# Patient Record
Sex: Female | Born: 1990 | Race: Black or African American | Hispanic: No | Marital: Single | Smoking: Current every day smoker
Health system: Southern US, Community
[De-identification: ages and names within clinical notes are randomized; demographics above are authoritative.]

## PROBLEM LIST (undated history)

## (undated) ENCOUNTER — Ambulatory Visit: Admission: EM | Disposition: A | Discharge status: Home / Self Care | Payer: 59

## (undated) DIAGNOSIS — T7840XA Allergy, unspecified, initial encounter: Secondary | ICD-10-CM

## (undated) DIAGNOSIS — R768 Other specified abnormal immunological findings in serum: Secondary | ICD-10-CM

## (undated) DIAGNOSIS — F32A Depression, unspecified: Secondary | ICD-10-CM

## (undated) DIAGNOSIS — N760 Acute vaginitis: Secondary | ICD-10-CM

## (undated) DIAGNOSIS — F319 Bipolar disorder, unspecified: Secondary | ICD-10-CM

## (undated) DIAGNOSIS — J45909 Unspecified asthma, uncomplicated: Secondary | ICD-10-CM

## (undated) DIAGNOSIS — F419 Anxiety disorder, unspecified: Secondary | ICD-10-CM

## (undated) DIAGNOSIS — B9689 Other specified bacterial agents as the cause of diseases classified elsewhere: Secondary | ICD-10-CM

## (undated) DIAGNOSIS — R7689 Other specified abnormal immunological findings in serum: Secondary | ICD-10-CM

## (undated) DIAGNOSIS — F329 Major depressive disorder, single episode, unspecified: Secondary | ICD-10-CM

## (undated) HISTORY — DX: Allergy, unspecified, initial encounter: T78.40XA

## (undated) HISTORY — DX: Unspecified asthma, uncomplicated: J45.909

## (undated) HISTORY — DX: Other specified abnormal immunological findings in serum: R76.8

## (undated) HISTORY — DX: Other specified abnormal immunological findings in serum: R76.89

## (undated) HISTORY — DX: Depression, unspecified: F32.A

## (undated) HISTORY — DX: Other specified bacterial agents as the cause of diseases classified elsewhere: B96.89

## (undated) HISTORY — DX: Anxiety disorder, unspecified: F41.9

## (undated) HISTORY — DX: Other specified bacterial agents as the cause of diseases classified elsewhere: N76.0

## (undated) HISTORY — DX: Bipolar disorder, unspecified: F31.9

## (undated) HISTORY — PX: WISDOM TOOTH EXTRACTION: SHX21

---
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
## 2015-03-21 DIAGNOSIS — J309 Allergic rhinitis, unspecified: Secondary | ICD-10-CM | POA: Insufficient documentation

## 2015-03-21 DIAGNOSIS — J4520 Mild intermittent asthma, uncomplicated: Secondary | ICD-10-CM | POA: Insufficient documentation

## 2015-04-03 DIAGNOSIS — B977 Papillomavirus as the cause of diseases classified elsewhere: Secondary | ICD-10-CM | POA: Insufficient documentation

## 2016-05-01 ENCOUNTER — Other Ambulatory Visit
Admit: 2016-05-01 | Discharge: 2016-05-01 | Disposition: A | Discharge status: Home / Self Care | Attending: Family Medicine | Admitting: Family Medicine

## 2016-05-01 NOTE — ED Notes (Signed)
Patient presents to ED in custody of [employer] officer [place]. Patient here for the purposes of a forensic blood draw secondary to the charge of her driving while impaired. Rights were read to patient by officer and patient provided verbal acknowledgement of those rights. Patient also providing verbal consent for this RN to obtain police requested blood specimens. Specimen kit (sealed) obtained from officer and opened by this RN in front of the patient. Materials assembled per protocol. Tourniquet applied to right upper arm; vein located. Venipuncture site cleansed with betadine and allowed to air dry. Venipuncture performed to RAC using provided materials. Two blood tubes collected from patient per police request; patient tolerated procedure well without any c/o of pain. Samples labeled, secured, and sealed in police provided bag/box in front of patient in order to maintain chain of custody. Kit provided to arresting officer (G[place]oux) by this RN. Patient departs from ED in custody of [employer] officer at this time. Patient denies the need to be seen and evaluated by ED provider at this time; no complaints reported. 

## 2016-07-08 ENCOUNTER — Ambulatory Visit
Admission: RE | Admit: 2016-07-08 | Discharge: 2016-07-08 | Disposition: A | Discharge status: Home / Self Care | Payer: Disability Insurance | Source: Ambulatory Visit | Attending: General Practice | Admitting: General Practice

## 2016-07-08 ENCOUNTER — Other Ambulatory Visit: Payer: Self-pay | Admitting: General Practice

## 2016-07-08 DIAGNOSIS — M19041 Primary osteoarthritis, right hand: Secondary | ICD-10-CM | POA: Insufficient documentation

## 2016-07-08 DIAGNOSIS — M19042 Primary osteoarthritis, left hand: Secondary | ICD-10-CM | POA: Insufficient documentation

## 2016-07-08 DIAGNOSIS — M1990 Unspecified osteoarthritis, unspecified site: Secondary | ICD-10-CM

## 2016-08-04 NOTE — Addendum Note (Signed)
Encounter addended by: Vilinda BoehringerHeather P Treshun Wold, RT on: 08/04/2016  9:09 AM<BR>    Actions taken: Imaging Exam begun

## 2016-08-04 NOTE — Addendum Note (Signed)
Encounter addended by: Vilinda BoehringerHeather P Consepcion Utt, RT on: 08/04/2016  9:08 AM<BR>    Actions taken: Imaging Exam begun

## 2016-11-30 ENCOUNTER — Telehealth (HOSPITAL_COMMUNITY): Payer: Self-pay | Admitting: Professional

## 2017-05-30 ENCOUNTER — Ambulatory Visit: Payer: Self-pay

## 2017-06-06 ENCOUNTER — Encounter (INDEPENDENT_AMBULATORY_CARE_PROVIDER_SITE_OTHER): Payer: Self-pay

## 2017-06-06 ENCOUNTER — Ambulatory Visit: Payer: Self-pay | Admitting: Pharmacy Technician

## 2017-06-06 DIAGNOSIS — Z79899 Other long term (current) drug therapy: Secondary | ICD-10-CM

## 2017-06-06 NOTE — Progress Notes (Signed)
Met with patient completed [facility] application.  Patient indicated that she was enrolled in a healthcare exchange through the [place].  She also indicated that she had applied for Medicaid and SS Disability.  Told patient that we needed a letter indicating her insurance with the [place] had been terminated and a denial letter from [facility] and SS Disability.  I also told patient that I needed a copy of her 2017 tax return.  Offered to allow patient the use of [facility]'s computer to print-off a copy of her tax return.   Patient stated that she was "tired of all the damn stuff that she had to produce to get government assistance".  Explained to patient that we provide medication assistance to uninsured individuals within [facility] whose income is at or below 250% of the [place].  Patients have to provide documentation that they are uninsured and meet the income criteria.  Patient stated that she did not want to finish eligibility appointment.  She wanted to leave.  Encouraged patient to remain and finish appointment and I would provide her with a checklist of additional information that I needed to determine her eligibility for our program.  Patient asked why I couldn't tell her if she was eligible right now.  I explained that I needed the additional requested documentation in order to make a final determination.    Patient got up abruptly and stormed out.  While she was leaving she stated that "she had to leave because she was about to cuss somebody out".    [facility]

## 2018-06-28 ENCOUNTER — Other Ambulatory Visit: Payer: Self-pay | Admitting: Ophthalmology

## 2018-06-28 DIAGNOSIS — H4710 Unspecified papilledema: Secondary | ICD-10-CM

## 2018-07-06 ENCOUNTER — Ambulatory Visit
Admission: RE | Admit: 2018-07-06 | Discharge: 2018-07-06 | Disposition: A | Discharge status: Home / Self Care | Payer: Medicaid Other | Source: Ambulatory Visit | Attending: Ophthalmology | Admitting: Ophthalmology

## 2018-07-06 DIAGNOSIS — H4710 Unspecified papilledema: Secondary | ICD-10-CM | POA: Diagnosis present

## 2018-07-06 MED ORDER — GADOBUTROL 1 MMOL/ML IV SOLN
7.0000 mL | Freq: Once | INTRAVENOUS | Status: AC | PRN
  Administered 2018-07-06: 7 mL via INTRAVENOUS

## 2018-10-02 ENCOUNTER — Other Ambulatory Visit: Payer: Self-pay

## 2018-10-02 ENCOUNTER — Encounter: Payer: Self-pay | Admitting: Emergency Medicine

## 2018-10-02 ENCOUNTER — Emergency Department
Admission: EM | Admit: 2018-10-02 | Discharge: 2018-10-02 | Discharge status: Against Medical Advice | Payer: Medicare Other | Attending: Emergency Medicine | Admitting: Emergency Medicine

## 2018-10-02 DIAGNOSIS — Y929 Unspecified place or not applicable: Secondary | ICD-10-CM | POA: Diagnosis not present

## 2018-10-02 DIAGNOSIS — Z23 Encounter for immunization: Secondary | ICD-10-CM | POA: Insufficient documentation

## 2018-10-02 DIAGNOSIS — W540XXA Bitten by dog, initial encounter: Secondary | ICD-10-CM | POA: Insufficient documentation

## 2018-10-02 DIAGNOSIS — Y999 Unspecified external cause status: Secondary | ICD-10-CM | POA: Insufficient documentation

## 2018-10-02 DIAGNOSIS — Z2914 Encounter for prophylactic rabies immune globin: Secondary | ICD-10-CM | POA: Diagnosis not present

## 2018-10-02 DIAGNOSIS — S71132A Puncture wound without foreign body, left thigh, initial encounter: Secondary | ICD-10-CM | POA: Insufficient documentation

## 2018-10-02 DIAGNOSIS — Y939 Activity, unspecified: Secondary | ICD-10-CM | POA: Diagnosis not present

## 2018-10-02 DIAGNOSIS — S79922A Unspecified injury of left thigh, initial encounter: Secondary | ICD-10-CM | POA: Diagnosis present

## 2018-10-02 DIAGNOSIS — F17210 Nicotine dependence, cigarettes, uncomplicated: Secondary | ICD-10-CM | POA: Insufficient documentation

## 2018-10-02 MED ORDER — RABIES IMMUNE GLOBULIN 150 UNIT/ML IM INJ
20.0000 [IU]/kg | INJECTION | Freq: Once | INTRAMUSCULAR | Status: DC
  Filled 2018-10-02: qty 12

## 2018-10-02 MED ORDER — RABIES VACCINE, PCEC IM SUSR: 1.0000 mL | Freq: Once | INTRAMUSCULAR | Status: DC

## 2018-10-02 NOTE — ED Triage Notes (Signed)
Dog bite last Wed on left upper thigh, unsure when dog had last rabies shots, Dr informed her to come here. NAD.

## 2018-10-02 NOTE — ED Notes (Signed)
This RN told by PA patient walked out and left

## 2018-10-02 NOTE — Discharge Instructions (Signed)
Please begin Augmentin.  Please return to the emergency department on April 6, April 20, April 27 for continued rabies vaccinations.

## 2018-10-02 NOTE — ED Notes (Signed)
See triage note  Presents s/p dog bite  Stats she was bitten by friends dog last weds .  States her friend told her that the dog was up to date. But when asked to have prove of rabies  She was not able to get that info

## 2018-10-02 NOTE — ED Provider Notes (Signed)
[facility] Emergency Department Provider Note  ____________________________________________  Time seen: Approximately 3:04 PM  I have reviewed the triage vital signs and the nursing notes.   HISTORY  Chief Complaint Animal Bite    HPI Ellen Kaiser is a 28 y.o. female presents emergency department for evaluation of dog bite to right thigh that happened on 4/8. Patient states that her friend's dog bit her when she was trying to enter their house.  Patient asked friend whether dog was up-to-date on rabies, dog responded yes but then blocked her when she requested a copy of the dog's rabies paperwork.  Patient went to primary care this morning who recommended that patient come to the emergency department for rabies vaccinations.  She was given a prescription for Augmentin at primary care.  Last tetanus shot was less than 5 years ago.   History reviewed. No pertinent past medical history.  There are no active problems to display for this patient.   History reviewed. No pertinent surgical history.  Prior to Admission medications   Not on File    Allergies Patient has no known allergies.  No family history on file.  Social History Social History   Tobacco Use  . Smoking status: Current Every Day Smoker    Packs/day: 0.50    Types: Cigarettes  . Smokeless tobacco: Never Used  Substance Use Topics  . Alcohol use: Not Currently  . Drug use: Not on file     Review of Systems  Constitutional: No fever/chills Respiratory: No SOB. Gastrointestinal:  No nausea, no vomiting.  Musculoskeletal: Negative for musculoskeletal pain. Skin: Negative for rash, ecchymosis.  Positive for puncture. Neurological: Negative for headaches, numbness or tingling   ____________________________________________   PHYSICAL EXAM:  VITAL SIGNS: ED Triage Vitals  Enc Vitals Group     BP 10/02/18 1410 121/81     Pulse Rate 10/02/18 1410 71     Resp 10/02/18  1410 18     Temp 10/02/18 1410 99 F (37.2 C)     Temp Source 10/02/18 1410 Oral     SpO2 10/02/18 1410 97 %     Weight 10/02/18 1403 174 lb (78.9 kg)     Height 10/02/18 1403 5' (1.524 m)     Head Circumference --      Peak Flow --      Pain Score 10/02/18 1403 0     Pain Loc --      Pain Edu? --      Excl. in GC? --      Constitutional: Alert and oriented. Well appearing and in no acute distress. Eyes: Conjunctivae are normal. PERRL. EOMI. Head: Atraumatic. ENT:      Ears:      Nose: No congestion/rhinnorhea.      Mouth/Throat: Mucous membranes are moist.  Neck: No stridor.  Cardiovascular: Normal rate, regular rhythm.  Good peripheral circulation. Respiratory: Normal respiratory effort without tachypnea or retractions. Lungs CTAB. Good air entry to the bases with no decreased or absent breath sounds. Musculoskeletal: Full range of motion to all extremities. No gross deformities appreciated.  Neurologic:  Normal speech and language. No gross focal neurologic deficits are appreciated.  Skin:  Skin is warm, dry.  Puncture to right thigh with yellow purulent drainage.  No surrounding erythema. Psychiatric: Mood and affect are normal. Speech and behavior are normal. Patient exhibits appropriate insight and judgement.   ____________________________________________   LABS (all labs ordered are listed, but only abnormal results are displayed)  Labs Reviewed - No data to display ____________________________________________  EKG   ____________________________________________  RADIOLOGY   No results found.  ____________________________________________    PROCEDURES  Procedure(s) performed:    Procedures    Medications  rabies vaccine (RABAVERT) injection 1 mL (has no administration in time range)  rabies immune globulin (HYPERAB/KEDRAB) injection 1,575 Units (has no administration in time range)     ____________________________________________   INITIAL  IMPRESSION / ASSESSMENT AND PLAN / ED COURSE  Pertinent labs & imaging results that were available during my care of the patient were reviewed by me and considered in my medical decision making (see chart for details).  Review of the [place] CSRS was performed in accordance of the [facility] prior to dispensing any controlled drugs.   Patient presents emergency department for evaluation of dog bite.  Patient was given a prescription for Augmentin by primary care.  Her tetanus shot is up-to-date.  Dog that bit patient is the dog of a friend that lives in [place].  Friend states that the dog is up-to-date with rabies but patient has not been able to get ahold of friend in [place] since she had requested rabies vaccination proof.  Police were consulted in the emergency department, who was able to get ahold of officials for [place] where patient's friend lives.  Rabies vaccination was not initially ordered upon patient's immediate arrival to the emergency department with hopes of being able to quarantine the dog through [facility].  I discussed with patient that we had the option of beginning rabies vaccines versus waiting a couple of days to see if animal control is able to get a hold of friends dog for quarantine.  Patient understands that rabies is deadly once symptoms develop without vaccination.  After patient talked with police in the emergency department and officials in [facility], patient elects to begin rabies vaccinations in the emergency department.  Patient eloped while waiting for rabies vaccination and immunoglobulin from pharmacy.      ____________________________________________  FINAL CLINICAL IMPRESSION(S) / ED DIAGNOSES  Final diagnoses:  Need for rabies vaccination  Dog bite, initial encounter      NEW MEDICATIONS STARTED DURING THIS VISIT:  ED Discharge Orders    None          This chart was dictated using voice recognition software/Dragon. Despite best efforts to  proofread, errors can occur which can change the meaning. Any change was purely unintentional.    Enid Derry, PA-C 10/02/18 Samuel Bouche, [place], MD 10/04/18 [phone]

## 2018-10-02 NOTE — ED Notes (Addendum)
This RN witnessed patient walking out of the waiting room.  This RN asked patient if I could assist her.  Patient reports frustration regarding having to wait for her rabies shots.  Patient states, "I'm about to go the f off because I could give a rabies shot to my self and it is really stressing me out to be here."  This RN explained that the first set of rabies shots takes some time for the pharmacy to get in order and this is likely the delay.  Patient states, "I just have to go and try to go somewhere else."

## 2019-04-02 ENCOUNTER — Ambulatory Visit (INDEPENDENT_AMBULATORY_CARE_PROVIDER_SITE_OTHER): Payer: Medicare Other | Admitting: Internal Medicine

## 2019-04-02 ENCOUNTER — Encounter: Payer: Self-pay | Admitting: Internal Medicine

## 2019-04-02 ENCOUNTER — Other Ambulatory Visit: Payer: Self-pay

## 2019-04-02 VITALS — BP 120/88 | HR 70 | Ht 61.0 in | Wt 164.0 lb

## 2019-04-02 DIAGNOSIS — Z202 Contact with and (suspected) exposure to infections with a predominantly sexual mode of transmission: Secondary | ICD-10-CM | POA: Diagnosis not present

## 2019-04-02 DIAGNOSIS — F17200 Nicotine dependence, unspecified, uncomplicated: Secondary | ICD-10-CM

## 2019-04-02 DIAGNOSIS — G43009 Migraine without aura, not intractable, without status migrainosus: Secondary | ICD-10-CM

## 2019-04-02 DIAGNOSIS — J4520 Mild intermittent asthma, uncomplicated: Secondary | ICD-10-CM | POA: Diagnosis not present

## 2019-04-02 DIAGNOSIS — F319 Bipolar disorder, unspecified: Secondary | ICD-10-CM | POA: Insufficient documentation

## 2019-04-02 MED ORDER — CHANTIX STARTING MONTH PAK 0.5 MG X 11 & 1 MG X 42 PO TABS: ORAL_TABLET | ORAL | 0 refills | Status: DC

## 2019-04-02 MED ORDER — VARENICLINE TARTRATE 1 MG PO TABS: 1.0000 mg | ORAL_TABLET | Freq: Two times a day (BID) | ORAL | 1 refills | Status: AC

## 2019-04-02 NOTE — Progress Notes (Signed)
Date:  04/02/2019   Name:  Ellen Kaiser   DOB:  07-06-1990   MRN:  [id number]   Chief Complaint: Establish Care and STD Screening  Asthma She complains of wheezing. There is no cough or shortness of breath. This is a recurrent problem. The problem occurs intermittently (about twice a week). The problem has been unchanged. Associated symptoms include headaches. Pertinent negatives include no chest pain, fever or trouble swallowing. Her symptoms are aggravated by exposure to smoke. Her symptoms are not alleviated by beta-agonist. Risk factors for lung disease include smoking/tobacco exposure. Her past medical history is significant for asthma.  Depression        This is a chronic problem.  The onset quality is undetermined (followed by Psychiatry for anxiety, insomnia, PTSD and depression).   Associated symptoms include headaches.  Associated symptoms include no fatigue and no suicidal ideas. Migraine  This is a recurrent problem. The problem occurs monthly. The problem has been unchanged. The pain is located in the frontal region. The pain does not radiate. The pain is moderate. Associated symptoms include nausea and photophobia. Pertinent negatives include no coughing, dizziness, fever or weakness. Treatments tried: usually sleep therapy with clonazepam and a dark room; sometimes takes nsaids. The treatment provided significant relief.  STD testing - pt reports one partner currently but she is not sure that she trusts him to be monogamous.  She uses condoms most of the time but occasionally does not and sometimes they break.  She has an IUD for contraception.  She denies vaginal discharge, genital sores, fatigue, fever, rash.  Review of Systems  Constitutional: Negative for chills, fatigue and fever.  HENT: Negative for trouble swallowing.   Eyes: Positive for photophobia.  Respiratory: Positive for wheezing. Negative for cough, chest tightness and shortness of breath.    Cardiovascular: Negative for chest pain, palpitations and leg swelling.  Gastrointestinal: Positive for nausea. Negative for constipation.  Genitourinary: Negative for dysuria, genital sores and hematuria.  Skin: Positive for rash (currently being treated for folliculits).  Neurological: Positive for headaches. Negative for dizziness, weakness and light-headedness.  Psychiatric/Behavioral: Positive for depression, dysphoric mood and sleep disturbance. Negative for suicidal ideas. The patient is nervous/anxious.     Patient Active Problem List   Diagnosis Date Noted  . Bipolar 1 disorder (Ramirez-Perez) 04/02/2019  . Tobacco use disorder 04/02/2019  . 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 Mild intermittent asthma without complication 57/84/6962  . Allergic rhinitis 03/21/2015    Allergies  Allergen Reactions  . Fish Allergy Anaphylaxis and Swelling    History reviewed. No pertinent surgical history.  Social History   Tobacco Use  . Smoking status: Current Every Day Smoker    Packs/day: 0.50    Years: 10.00    Pack years: 5.00    Types: Cigarettes  . Smokeless tobacco: Never Used  Substance Use Topics  . Alcohol use: Not Currently  . Drug use: Never     Medication list has been reviewed and updated.  Current Meds  Medication Sig  . ARIPiprazole (ABILIFY) 10 MG tablet Take 10 mg by mouth daily.  . cephALEXin (KEFLEX) 250 MG capsule Take 250 mg by mouth daily.  . clonazePAM (KLONOPIN) 1 MG tablet Take 1 tablet by mouth 2 (two) times daily.  . hydrOXYzine (ATARAX/VISTARIL) 50 MG tablet Take 1 tablet by mouth 3 (three) times daily.    PHQ 2/9 Scores 04/02/2019  PHQ - 2 Score 4  PHQ- 9 Score 7  BP Readings from Last 3 Encounters:  04/02/19 120/88  10/02/18 121/81    Physical Exam Vitals signs and nursing note reviewed.  Constitutional:      General: She is not in acute distress.    Appearance: Normal appearance. She is well-developed.  HENT:     Head: Normocephalic and  atraumatic.  Neck:     Musculoskeletal: Normal range of motion and neck supple.     Vascular: No carotid bruit.  Cardiovascular:     Rate and Rhythm: Normal rate and regular rhythm.     Pulses: Normal pulses.     Heart sounds: No murmur.  Pulmonary:     Effort: Pulmonary effort is normal. No respiratory distress.     Breath sounds: No wheezing or rhonchi.  Musculoskeletal: Normal range of motion.     Right lower leg: No edema.     Left lower leg: No edema.  Lymphadenopathy:     Cervical: No cervical adenopathy.  Skin:    General: Skin is warm and dry.     Capillary Refill: Capillary refill takes less than 2 seconds.     Findings: No rash.  Neurological:     General: No focal deficit present.     Mental Status: She is alert and oriented to person, place, and time.  Psychiatric:        Attention and Perception: Attention normal.        Mood and Affect: Mood normal.        Behavior: Behavior normal.        Thought Content: Thought content normal.        Cognition and Memory: Cognition normal.     Wt Readings from Last 3 Encounters:  04/02/19 164 lb (74.4 kg)  10/02/18 174 lb (78.9 kg)    BP 120/88   Pulse 70   Ht 5\' 1"  (1.549 m)   Wt 164 lb (74.4 kg)   SpO2 98%   BMI 30.99 kg/m   Assessment and Plan: 1. Mild intermittent asthma without complication Currently stable with mild intermittent sx Continue albuterol inhaler as needed If sx become more frequent, may need step up therapy  2. Tobacco use disorder Pt is very interested in quitting - will try Chantix Pt is given warning regarding changes in mood on this medication - varenicline (CHANTIX STARTING MONTH PAK) 0.5 MG X 11 & 1 MG X 42 tablet; Take one 0.5 mg tablet by mouth once daily for 3 days, then increase to one 0.5 mg tablet twice daily for 4 days, then increase to one 1 mg tablet twice daily.  Dispense: 53 tablet; Refill: 0 - varenicline (CHANTIX CONTINUING MONTH PAK) 1 MG tablet; Take 1 tablet (1 mg total)  by mouth 2 (two) times daily.  Dispense: 180 tablet; Refill: 1  3. Bipolar 1 disorder (HCC) Followed by Psychiatry  4. Exposure to sexually transmitted disease (STD) - GC/Chlamydia probe amp ([place])not at [facility] - HIV Antibody (routine testing w rflx)  5. Migraine without aura and without status migrainosus, not intractable Continue current therapy Return if headaches become more severe/frequent/unresponsive to current treatment   Partially dictated using [facility]. Any errors are unintentional.  Animal nutritionist, MD [facility]  04/02/2019

## 2019-04-02 NOTE — Addendum Note (Signed)
Addended by: Clista Bernhardt on: 04/02/2019 03:51 PM   Modules accepted: Orders

## 2019-04-02 NOTE — Patient Instructions (Signed)
Varenicline oral tablets What is this medicine? VARENICLINE (var e NI kleen) is used to help people quit smoking. It is used with a patient support program recommended by your physician. This medicine may be used for other purposes; ask your health care provider or pharmacist if you have questions. COMMON BRAND NAME(S): Chantix What should I tell my health care provider before I take this medicine? They need to know if you have any of these conditions:  heart disease  if you often drink alcohol  kidney disease  mental illness  on hemodialysis  seizures  history of stroke  suicidal thoughts, plans, or attempt; a previous suicide attempt by you or a family member  an unusual or allergic reaction to varenicline, other medicines, foods, dyes, or preservatives  pregnant or trying to get pregnant  breast-feeding How should I use this medicine? Take this medicine by mouth after eating. Take with a full glass of water. Follow the directions on the prescription label. Take your doses at regular intervals. Do not take your medicine more often than directed. There are 3 ways you can use this medicine to help you quit smoking; talk to your health care professional to decide which plan is right for you: 1) you can choose a quit date and start this medicine 1 week before the quit date, or, 2) you can start taking this medicine before you choose a quit date, and then pick a quit date between day 8 and 35 days of treatment, or, 3) if you are not sure that you are able or willing to quit smoking right away, start taking this medicine and slowly decrease the amount you smoke as directed by your health care professional with the goal of being cigarette-free by week 12 of treatment. Stick to your plan; ask about support groups or other ways to help you remain cigarette-free. If you are motivated to quit smoking and did not succeed during a previous attempt with this medicine for reasons other than  side effects, or if you returned to smoking after this treatment, speak with your health care professional about whether another course of this medicine may be right for you. A special MedGuide will be given to you by the pharmacist with each prescription and refill. Be sure to read this information carefully each time. Talk to your pediatrician regarding the use of this medicine in children. This medicine is not approved for use in children. Overdosage: If you think you have taken too much of this medicine contact a poison control center or emergency room at once. NOTE: This medicine is only for you. Do not share this medicine with others. What if I miss a dose? If you miss a dose, take it as soon as you can. If it is almost time for your next dose, take only that dose. Do not take double or extra doses. What may interact with this medicine?  alcohol  insulin  other medicines used to help people quit smoking  theophylline  warfarin This list may not describe all possible interactions. Give your health care provider a list of all the medicines, herbs, non-prescription drugs, or dietary supplements you use. Also tell them if you smoke, drink alcohol, or use illegal drugs. Some items may interact with your medicine. What should I watch for while using this medicine? It is okay if you do not succeed at your attempt to quit and have a cigarette. You can still continue your quit attempt and keep using this medicine as directed.   Just throw away your cigarettes and get back to your quit plan. Talk to your health care provider before using other treatments to quit smoking. Using this medicine with other treatments to quit smoking may increase the risk for side effects compared to using a treatment alone. You may get drowsy or dizzy. Do not drive, use machinery, or do anything that needs mental alertness until you know how this medicine affects you. Do not stand or sit up quickly, especially if you are  an older patient. This reduces the risk of dizzy or fainting spells. Decrease the number of alcoholic beverages that you drink during treatment with this medicine until you know if this medicine affects your ability to tolerate alcohol. Some people have experienced increased drunkenness (intoxication), unusual or sometimes aggressive behavior, or no memory of things that have happened (amnesia) during treatment with this medicine. Sleepwalking can happen during treatment with this medicine, and can sometimes lead to behavior that is harmful to you, other people, or property. Stop taking this medicine and tell your doctor if you start sleepwalking or have other unusual sleep-related activity. After taking this medicine, you may get up out of bed and do an activity that you do not know you are doing. The next morning, you may have no memory of this. Activities include driving a car ("sleep-driving"), making and eating food, talking on the phone, sexual activity, and sleep-walking. Serious injuries have occurred. Stop the medicine and call your doctor right away if you find out you have done any of these activities. Do not take this medicine if you have used alcohol that evening. Do not take it if you have taken another medicine for sleep. The risk of doing these sleep-related activities is higher. Patients and their families should watch out for new or worsening depression or thoughts of suicide. Also watch out for sudden changes in feelings such as feeling anxious, agitated, panicky, irritable, hostile, aggressive, impulsive, severely restless, overly excited and hyperactive, or not being able to sleep. If this happens, call your health care professional. If you have diabetes and you quit smoking, the effects of insulin may be increased and you may need to reduce your insulin dose. Check with your doctor or health care professional about how you should adjust your insulin dose. What side effects may I notice  from receiving this medicine? Side effects that you should report to your doctor or health care professional as soon as possible:  allergic reactions like skin rash, itching or hives, swelling of the face, lips, tongue, or throat  acting aggressive, being angry or violent, or acting on dangerous impulses  breathing problems  changes in emotions or moods  chest pain or chest tightness  feeling faint or lightheaded, falls  hallucination, loss of contact with reality  mouth sores  redness, blistering, peeling or loosening of the skin, including inside the mouth  signs and symptoms of a stroke like changes in vision; confusion; trouble speaking or understanding; severe headaches; sudden numbness or weakness of the face, arm or leg; trouble walking; dizziness; loss of balance or coordination  seizures  sleepwalking  suicidal thoughts or other mood changes Side effects that usually do not require medical attention (report to your doctor or health care professional if they continue or are bothersome):  constipation  gas  headache  nausea, vomiting  strange dreams  trouble sleeping This list may not describe all possible side effects. Call your doctor for medical advice about side effects. You may report side   effects to FDA at 1-800-FDA-1088. Where should I keep my medicine? Keep out of the reach of children. Store at room temperature between 15 and 30 degrees C (59 and 86 degrees F). Throw away any unused medicine after the expiration date. NOTE: This sheet is a summary. It may not cover all possible information. If you have questions about this medicine, talk to your doctor, pharmacist, or health care provider.  2020 Elsevier/Gold Standard (2018-05-26 14:27:36)  

## 2019-04-03 LAB — HIV ANTIBODY (ROUTINE TESTING W REFLEX): HIV Screen 4th Generation wRfx: NONREACTIVE

## 2019-04-04 LAB — GC/CHLAMYDIA PROBE AMP
Chlamydia trachomatis, NAA: NEGATIVE
Neisseria Gonorrhoeae by PCR: NEGATIVE

## 2019-05-23 ENCOUNTER — Encounter: Payer: Self-pay | Admitting: Internal Medicine

## 2019-05-23 DIAGNOSIS — B009 Herpesviral infection, unspecified: Secondary | ICD-10-CM | POA: Insufficient documentation

## 2019-05-23 DIAGNOSIS — R7689 Other specified abnormal immunological findings in serum: Secondary | ICD-10-CM | POA: Insufficient documentation

## 2019-05-23 DIAGNOSIS — R768 Other specified abnormal immunological findings in serum: Secondary | ICD-10-CM | POA: Insufficient documentation

## 2019-05-24 ENCOUNTER — Other Ambulatory Visit: Payer: Self-pay

## 2019-05-24 ENCOUNTER — Ambulatory Visit (INDEPENDENT_AMBULATORY_CARE_PROVIDER_SITE_OTHER): Payer: Medicare Other | Admitting: Internal Medicine

## 2019-05-24 ENCOUNTER — Encounter: Payer: Self-pay | Admitting: Internal Medicine

## 2019-05-24 VITALS — BP 122/70 | HR 73 | Ht 61.0 in | Wt 161.0 lb

## 2019-05-24 DIAGNOSIS — N760 Acute vaginitis: Secondary | ICD-10-CM

## 2019-05-24 DIAGNOSIS — Z975 Presence of (intrauterine) contraceptive device: Secondary | ICD-10-CM

## 2019-05-24 DIAGNOSIS — B9689 Other specified bacterial agents as the cause of diseases classified elsewhere: Secondary | ICD-10-CM | POA: Diagnosis not present

## 2019-05-24 LAB — POCT WET PREP WITH KOH
KOH Prep POC: NEGATIVE
RBC Wet Prep HPF POC: 0
Trichomonas, UA: NEGATIVE
Yeast Wet Prep HPF POC: NEGATIVE

## 2019-05-24 MED ORDER — METRONIDAZOLE 500 MG PO TABS: 500.0000 mg | ORAL_TABLET | Freq: Two times a day (BID) | ORAL | 0 refills | Status: DC

## 2019-05-24 NOTE — Progress Notes (Signed)
Date:  05/24/2019   Name:  Ellen Kaiser   DOB:  03-25-1991   MRN:  [id number]   Chief Complaint: Vaginal Itching (Itching and foul odor. Has had BV in the past. Pains in Rt side. Discomfort- like a "tickle from the inside" )  Vaginal Itching The patient's primary symptoms include genital itching, a genital odor, pelvic pain and vaginal discharge. This is a recurrent problem. Pertinent negatives include no chills, dysuria, fever, headaches or hematuria.  She was previously using Boric acid suppositories for prevention but ran out several months ago. She also has intermittent right sided pelvic discomfort after intercourse.  It is sharp but not severe and lasts about 10 minutes.  At other times she has a milder sensation of fullness or something moving inside.  The sx started when she had an IUD placed last year at [facility].  No results found for: CREATININE, BUN, NA, K, CL, CO2 No results found for: CHOL, HDL, LDLCALC, LDLDIRECT, TRIG, CHOLHDL No results found for: TSH No results found for: HGBA1C   Review of Systems  Constitutional: Negative for chills, fatigue and fever.  Respiratory: Negative for chest tightness and shortness of breath.   Cardiovascular: Negative for chest pain and leg swelling.  Genitourinary: Positive for pelvic pain and vaginal discharge. Negative for difficulty urinating, dysuria, hematuria and menstrual problem.  Neurological: Negative for dizziness and headaches.    Patient Active Problem List   Diagnosis Date Noted  . 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 Bipolar 1 disorder (HCC) 04/02/2019  . Tobacco use disorder 04/02/2019  . Migraine without aura and without status migrainosus, not intractable 04/02/2019  . Mild intermittent asthma without complication 03/21/2015  . Allergic rhinitis 03/21/2015    Allergies  Allergen Reactions  . Fish Allergy Anaphylaxis and Swelling    History reviewed. No pertinent surgical history.   Social History   Tobacco Use  . Smoking status: Current Every Day Smoker    Packs/day: 0.50    Years: 10.00    Pack years: 5.00    Types: Cigarettes  . Smokeless tobacco: Never Used  Substance Use Topics  . Alcohol use: Not Currently  . Drug use: Yes    Types: Marijuana     Medication list has been reviewed and updated.  Current Meds  Medication Sig  . ARIPiprazole (ABILIFY) 10 MG tablet Take 10 mg by mouth daily.  . clonazePAM (KLONOPIN) 1 MG tablet Take 1 tablet by mouth 2 (two) times daily.  . hydrOXYzine (ATARAX/VISTARIL) 50 MG tablet Take 1 tablet by mouth 3 (three) times daily.  Marland Kitchen levonorgestrel (MIRENA) 20 MCG/24HR IUD 1 each by Intrauterine route once.  . varenicline (CHANTIX STARTING MONTH PAK) 0.5 MG X 11 & 1 MG X 42 tablet Take one 0.5 mg tablet by mouth once daily for 3 days, then increase to one 0.5 mg tablet twice daily for 4 days, then increase to one 1 mg tablet twice daily.    PHQ 2/9 Scores 05/24/2019 04/02/2019  PHQ - 2 Score 2 4  PHQ- 9 Score 7 7    BP Readings from Last 3 Encounters:  05/24/19 122/70  04/02/19 120/88  10/02/18 121/81    Physical Exam Vitals signs and nursing note reviewed.  Constitutional:      General: She is not in acute distress.    Appearance: Normal appearance. She is well-developed.  HENT:     Head: Normocephalic and atraumatic.  Cardiovascular:     Rate and  Rhythm: Normal rate and regular rhythm.     Heart sounds: No murmur.  Pulmonary:     Effort: Pulmonary effort is normal. No respiratory distress.     Breath sounds: No wheezing or rhonchi.  Abdominal:     General: There is no distension.     Palpations: Abdomen is soft. There is no mass.     Tenderness: There is no abdominal tenderness. There is no guarding or rebound.  Musculoskeletal:     Right lower leg: No edema.     Left lower leg: No edema.  Lymphadenopathy:     Cervical: No cervical adenopathy.  Skin:    General: Skin is warm and dry.     Findings: No  rash.  Neurological:     Mental Status: She is alert and oriented to person, place, and time.  Psychiatric:        Behavior: Behavior normal.        Thought Content: Thought content normal.     Wt Readings from Last 3 Encounters:  05/24/19 161 lb (73 kg)  04/02/19 164 lb (74.4 kg)  10/02/18 174 lb (78.9 kg)    BP 122/70   Pulse 73   Ht 5\' 1"  (1.549 m)   Wt 161 lb (73 kg)   SpO2 100%   BMI 30.42 kg/m   Assessment and Plan: 1. BV (bacterial vaginosis) Recurrent infections - will refer to GYN for further evaluation and prevention - metroNIDAZOLE (FLAGYL) 500 MG tablet; Take 1 tablet (500 mg total) by mouth 2 (two) times daily for 14 days.  Dispense: 28 tablet; Refill: 0 - Ambulatory referral to Obstetrics / Gynecology - POCT Wet Prep with KOH  2. IUD (intrauterine device) in place Possible mild pelvic sx related to IUD Recommend GYN evaluation to further evaluate - Ambulatory referral to Obstetrics / Gynecology   Partially dictated using [place]. Any errors are unintentional.  Halina Maidens, MD [facility]  05/24/2019

## 2019-05-29 ENCOUNTER — Encounter: Payer: Self-pay | Admitting: Obstetrics and Gynecology

## 2019-05-29 NOTE — Progress Notes (Signed)
Reubin MilanBerglund, Ellen H, MD   Chief Complaint  Patient presents with  . Referral    recurrent BV, no sx currently  . IUD check    for the past 1-2 months on/off tingleling/pain sensation on right pelvic side    HPI:  [name] is a 28 y.o. G0P0000 who LMP was No LMP recorded. (Menstrual status: IUD)., presents today for NP eval of recurrent BV, referred by PCP. Treated with flagyl multiple times with sx recurrence. Currently doing flagyl and no sx today. Did boric acid in past but not recently. No longer taking probiotics. She is sex active, not using condoms usually; no new partners. Neg gon/chlam 10/20. Uses dove sens skin soap, dryer sheets, no vag wipes. Wears nylon underwear, no thongs. Hx of HSV in past. Uses tobacco.  Has Mirena IUD, placed ~5/19. Has 2-3 days light spotting randomly. Mild dysmen. Has noticed RLQ pain after sex for the past month. No sx during sex except in certain positions, but sx occur afterwards and last 5-10 min. Pain is sharp and constant, no meds taken. Also sometimes feels like "tickle on the inside". Can have tickle sensation even if not sex active. No UTI sx, no vag sx, no fevers/chills. Hx of ovar cyst in past.   Patient Active Problem List   Diagnosis Date Noted  . 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 Bipolar 1 disorder (HCC) 04/02/2019  . Tobacco use disorder 04/02/2019  . Migraine without aura and without status migrainosus, not intractable 04/02/2019  . Mild intermittent asthma without complication 03/21/2015  . Allergic rhinitis 03/21/2015    History reviewed. No pertinent surgical history.  Family History  Problem Relation Age of Onset  . Depression Mother   . Alcohol abuse Mother   . Bipolar disorder Mother     Social History   Socioeconomic History  . Marital status: Single    Spouse name: Not on file  . Number of children: Not on file  . Years of education: Not on file  . Highest education level:  Not on file  Occupational History  . Occupation: disabled    Comment: PTSD  Social Needs  . Financial resource strain: Not on file  . Food insecurity    Worry: Not on file    Inability: Not on file  . Transportation needs    Medical: Not on file    Non-medical: Not on file  Tobacco Use  . Smoking status: Current Every Day Smoker    Packs/day: 0.50    Years: 10.00    Pack years: 5.00    Types: Cigarettes  . Smokeless tobacco: Never Used  Substance and Sexual Activity  . Alcohol use: Not Currently  . Drug use: Yes    Types: Marijuana  . Sexual activity: Yes    Partners: Male    Birth control/protection: I.U.D.    Comment: Mirena  Lifestyle  . Physical activity    Days per week: Not on file    Minutes per session: Not on file  . Stress: Not on file  Relationships  . Social Musicianconnections    Talks on phone: Not on file    Gets together: Not on file    Attends religious service: Not on file    Active member of club or organization: Not on file    Attends meetings of clubs or organizations: Not on file    Relationship status: Not on file  . Intimate partner violence  Fear of current or ex partner: Not on file    Emotionally abused: Not on file    Physically abused: Not on file    Forced sexual activity: Not on file  Other Topics Concern  . Not on file  Social History Narrative  . Not on file    Outpatient Medications Prior to Visit  Medication Sig Dispense Refill  . ARIPiprazole (ABILIFY) 15 MG tablet Take by mouth.    . clonazePAM (KLONOPIN) 1 MG tablet Take 1 tablet by mouth 2 (two) times daily.    . hydrOXYzine (ATARAX/VISTARIL) 50 MG tablet Take 1 tablet by mouth 3 (three) times daily.    Ellen Kaiser Kitchen levonorgestrel (MIRENA) 20 MCG/24HR IUD 1 each by Intrauterine route once.    . varenicline (CHANTIX STARTING MONTH PAK) 0.5 MG X 11 & 1 MG X 42 tablet Take one 0.5 mg tablet by mouth once daily for 3 days, then increase to one 0.5 mg tablet twice daily for 4 days, then  increase to one 1 mg tablet twice daily. 53 tablet 0  . varenicline (CHANTIX CONTINUING MONTH PAK) 1 MG tablet Take 1 tablet (1 mg total) by mouth 2 (two) times daily. (Patient not taking: Reported on 05/30/2019) 180 tablet 1  . ARIPiprazole (ABILIFY) 10 MG tablet Take 10 mg by mouth daily.    . metroNIDAZOLE (FLAGYL) 500 MG tablet Take 1 tablet (500 mg total) by mouth 2 (two) times daily for 14 days. 28 tablet 0   No facility-administered medications prior to visit.       ROS:  Review of Systems  Constitutional: Negative for fatigue, fever and unexpected weight change.  Respiratory: Negative for cough, shortness of breath and wheezing.   Cardiovascular: Negative for chest pain, palpitations and leg swelling.  Gastrointestinal: Negative for blood in stool, constipation, diarrhea, nausea and vomiting.  Endocrine: Negative for cold intolerance, heat intolerance and polyuria.  Genitourinary: Positive for frequency and vaginal discharge. Negative for dyspareunia, dysuria, flank pain, genital sores, hematuria, menstrual problem, pelvic pain, urgency, vaginal bleeding and vaginal pain.  Musculoskeletal: Negative for back pain, joint swelling and myalgias.  Skin: Negative for rash.  Neurological: Negative for dizziness, syncope, light-headedness, numbness and headaches.  Hematological: Negative for adenopathy.  Psychiatric/Behavioral: Positive for agitation and dysphoric mood. Negative for confusion, sleep disturbance and suicidal ideas. The patient is not nervous/anxious.   BREAST: No symptoms   OBJECTIVE:   Vitals:  BP 98/60   Ht 5' (1.524 m)   Wt 162 lb (73.5 kg)   BMI 31.64 kg/m   Physical Exam Vitals signs reviewed.  Constitutional:      Appearance: She is well-developed.  Neck:     Musculoskeletal: Normal range of motion.  Pulmonary:     Effort: Pulmonary effort is normal.  Genitourinary:    General: Normal vulva.     Pubic Area: No rash.      Labia:        Right: No  rash, tenderness or lesion.        Left: No rash, tenderness or lesion.      Vagina: Normal. No vaginal discharge, erythema or tenderness.     Cervix: Normal.     Uterus: Normal. Not enlarged and not tender.      Adnexa: Right adnexa normal and left adnexa normal.       Right: No mass or tenderness.         Left: No mass or tenderness.       Comments: IUD STRINGS  NOT SEEN IN CX OS Musculoskeletal: Normal range of motion.  Skin:    General: Skin is warm and dry.  Neurological:     General: No focal deficit present.     Mental Status: She is alert and oriented to person, place, and time.  Psychiatric:        Mood and Affect: Mood normal.        Behavior: Behavior normal.        Thought Content: Thought content normal.        Judgment: Judgment normal.     Assessment/Plan: Bacterial vaginosis--Recurrent sx, no sx today, doing flagyl. Will change to clindamycin if sx recur. Will check one swab culture for AV and BV if sx persist after clinda tx. Discussed vag hygiene as prevention--line dry underwear, add probiotics, boric acid supp, use condoms. F/u prn.   Screening for STD (sexually transmitted disease) - Plan: [facility] STD  Intrauterine contraceptive device threads lost, initial encounter - Plan: US Transvaginal Non-OB; Check GYN u/s. Will call with results.   RLQ abdominal pain - Plan: US Transvaginal Non-OB--For 1 mo. Check GYN u/s for IUD placement/ovar cyst. Will f/u with results.     Return in about 1 day (around 05/31/2019) for GYN u/s for IUD placement/RLQ pain--[facility] to call pt.  Ishaaq Penna B. Mehki Klumpp, PA-C 05/30/2019 10:24 AM

## 2019-05-30 ENCOUNTER — Other Ambulatory Visit: Payer: Self-pay

## 2019-05-30 ENCOUNTER — Encounter: Payer: Self-pay | Admitting: Obstetrics and Gynecology

## 2019-05-30 ENCOUNTER — Other Ambulatory Visit (HOSPITAL_COMMUNITY)
Admission: RE | Admit: 2019-05-30 | Discharge: 2019-05-30 | Disposition: A | Discharge status: Home / Self Care | Payer: Medicare Other | Source: Ambulatory Visit | Attending: Obstetrics and Gynecology | Admitting: Obstetrics and Gynecology

## 2019-05-30 ENCOUNTER — Ambulatory Visit (INDEPENDENT_AMBULATORY_CARE_PROVIDER_SITE_OTHER): Payer: Medicare Other | Admitting: Obstetrics and Gynecology

## 2019-05-30 VITALS — BP 98/60 | Ht 60.0 in | Wt 162.0 lb

## 2019-05-30 DIAGNOSIS — T8332XA Displacement of intrauterine contraceptive device, initial encounter: Secondary | ICD-10-CM

## 2019-05-30 DIAGNOSIS — Z113 Encounter for screening for infections with a predominantly sexual mode of transmission: Secondary | ICD-10-CM | POA: Insufficient documentation

## 2019-05-30 DIAGNOSIS — R1031 Right lower quadrant pain: Secondary | ICD-10-CM

## 2019-05-30 DIAGNOSIS — N760 Acute vaginitis: Secondary | ICD-10-CM | POA: Diagnosis not present

## 2019-05-30 DIAGNOSIS — B9689 Other specified bacterial agents as the cause of diseases classified elsewhere: Secondary | ICD-10-CM

## 2019-05-30 NOTE — Patient Instructions (Addendum)
I value your feedback and entrusting [place] with your care. If you get a [place] patient survey, I would appreciate you taking the time to let us know about your experience today. Thank you!  HEALTHY VAGINAL HYGIENE  AVOID   Panytyhose Synthetic underwear (wear COTTON underwear)  Tight pants/jeans Thongs Pantyliners Scented soaps/shower gels (use Dove Sensitive Skin soap or water to clean) Bubble bath/bath bombs Scented detergents  ALL dryer sheets (line dry underwear if using them on your other clothing) Feminine sprays/douches   FOR RECURRENT BACTERIAL VAGINOSIS (BV) Above recommendations and ADD probiotics daily, USE CONDOMS, boric acid suppositories  Visit [email or web address]

## 2019-05-31 ENCOUNTER — Ambulatory Visit: Payer: Medicare Other

## 2019-06-01 LAB — CERVICOVAGINAL ANCILLARY ONLY
Chlamydia: NEGATIVE
Comment: NEGATIVE
Comment: NORMAL
Neisseria Gonorrhea: NEGATIVE

## 2019-06-04 ENCOUNTER — Other Ambulatory Visit: Payer: Self-pay

## 2019-06-04 ENCOUNTER — Ambulatory Visit (INDEPENDENT_AMBULATORY_CARE_PROVIDER_SITE_OTHER): Payer: Medicare Other

## 2019-06-04 DIAGNOSIS — R1032 Left lower quadrant pain: Secondary | ICD-10-CM

## 2019-06-04 DIAGNOSIS — N83202 Unspecified ovarian cyst, left side: Secondary | ICD-10-CM

## 2019-06-05 ENCOUNTER — Telehealth: Payer: Self-pay | Admitting: Obstetrics and Gynecology

## 2019-06-05 NOTE — Telephone Encounter (Signed)
Pt aware of GYN u/s results. Having RLQ pain, no sx on LLQ. NSAIDs. REchk u/s in 12 wks/sooner prn sx. IUD in correct location. F/u prn.   ULTRASOUND REPORT  Location: [facility]  Date of Service: 06/04/2019     Indications: IUD placement/Pain  Findings:  The uterus is anteverted and measures 7.1 x 3.3 x 2.8 cm. Echo texture is homogenous without evidence of focal masses.  The Endometrium measures 3 mm. IUD seen with in the endometrium.  Right Ovary measures 3.9 x 2.1 x 2.4 cm. It is normal in appearance. Left Ovary measures 4.5 x 3.9 x 3.2 cm. It is not normal in appearance.Complex cystic lesion measuring 3.0 x 1.6 x 2.1 cm  Survey of the adnexa demonstrates no adnexal masses. There is no free fluid in the cul de sac.  Impression: 1.Complex cystic lesion measuring 3.0 x 1.6 x 2.1 cm. 2.IUD was seen with in the endometrium, appears to be located correctly.  Jenine M. Albertine Grates    RDMS  The ultrasound images and findings were reviewed by me and I agree with the above report.  Prentice Docker, MD, [facility], [facility] 06/04/2019 5:59 PM

## 2019-08-18 ENCOUNTER — Encounter (INDEPENDENT_AMBULATORY_CARE_PROVIDER_SITE_OTHER): Payer: Self-pay

## 2019-10-08 ENCOUNTER — Encounter: Payer: Self-pay | Admitting: Family Medicine

## 2019-10-08 ENCOUNTER — Other Ambulatory Visit: Payer: Self-pay

## 2019-10-08 ENCOUNTER — Encounter (INDEPENDENT_AMBULATORY_CARE_PROVIDER_SITE_OTHER): Payer: Self-pay

## 2019-10-08 ENCOUNTER — Ambulatory Visit (INDEPENDENT_AMBULATORY_CARE_PROVIDER_SITE_OTHER): Payer: Medicare Other | Admitting: Family Medicine

## 2019-10-08 ENCOUNTER — Other Ambulatory Visit (HOSPITAL_COMMUNITY)
Admission: RE | Admit: 2019-10-08 | Discharge: 2019-10-08 | Disposition: A | Discharge status: Home / Self Care | Payer: Medicare Other | Source: Ambulatory Visit | Attending: Family Medicine | Admitting: Family Medicine

## 2019-10-08 VITALS — BP 105/73 | HR 58 | Temp 97.3°F | Resp 16 | Ht 60.0 in | Wt 156.0 lb

## 2019-10-08 DIAGNOSIS — N760 Acute vaginitis: Secondary | ICD-10-CM | POA: Diagnosis not present

## 2019-10-08 DIAGNOSIS — F320 Major depressive disorder, single episode, mild: Secondary | ICD-10-CM | POA: Insufficient documentation

## 2019-10-08 DIAGNOSIS — F411 Generalized anxiety disorder: Secondary | ICD-10-CM | POA: Insufficient documentation

## 2019-10-08 DIAGNOSIS — B009 Herpesviral infection, unspecified: Secondary | ICD-10-CM

## 2019-10-08 DIAGNOSIS — B9689 Other specified bacterial agents as the cause of diseases classified elsewhere: Secondary | ICD-10-CM | POA: Diagnosis not present

## 2019-10-08 DIAGNOSIS — Z7689 Persons encountering health services in other specified circumstances: Secondary | ICD-10-CM | POA: Diagnosis not present

## 2019-10-08 DIAGNOSIS — K219 Gastro-esophageal reflux disease without esophagitis: Secondary | ICD-10-CM | POA: Insufficient documentation

## 2019-10-08 DIAGNOSIS — Z7252 High risk homosexual behavior: Secondary | ICD-10-CM

## 2019-10-08 DIAGNOSIS — F32A Depression, unspecified: Secondary | ICD-10-CM | POA: Insufficient documentation

## 2019-10-08 DIAGNOSIS — F419 Anxiety disorder, unspecified: Secondary | ICD-10-CM | POA: Insufficient documentation

## 2019-10-08 DIAGNOSIS — J45909 Unspecified asthma, uncomplicated: Secondary | ICD-10-CM | POA: Insufficient documentation

## 2019-10-08 LAB — POCT URINALYSIS DIPSTICK
Bilirubin, UA: NEGATIVE
Blood, UA: NEGATIVE
Glucose, UA: NEGATIVE
Ketones, UA: NEGATIVE
Leukocytes, UA: NEGATIVE
Nitrite, UA: NEGATIVE
Protein, UA: NEGATIVE
Spec Grav, UA: 1.015 (ref 1.010–1.025)
Urobilinogen, UA: 0.2 E.U./dL
pH, UA: 5 (ref 5.0–8.0)

## 2019-10-08 LAB — CBC WITH DIFFERENTIAL/PLATELET
Basophils Relative: 0.6 %
Eosinophils Relative: 0.4 %
MCHC: 33.9 g/dL (ref 32.0–36.0)

## 2019-10-08 MED ORDER — METRONIDAZOLE 500 MG PO TABS: 500.0000 mg | ORAL_TABLET | Freq: Two times a day (BID) | ORAL | 0 refills | Status: DC

## 2019-10-08 MED ORDER — METRONIDAZOLE 0.75 % EX GEL: 1.0000 "application " | CUTANEOUS | 2 refills | Status: DC

## 2019-10-08 NOTE — Progress Notes (Signed)
Subjective:    Patient ID: Ellen Kaiser, female    DOB: Nov 08, 1990, 29 y.o.   MRN: [id number]  Ellen Kaiser is a 29 y.o. female presenting on 10/08/2019 for Establish Care (concern that she might have bv. Complains of a foul fish odor, creamy whitish discharge x 2 days )   HPI  Previous PCP was at [facility] with [facility].  Records will not be requested as are already in EMR.  Past medical, family, and surgical history reviewed w/ pt.  Ms. Clere has acute concerns today for vaginal discharge.  States has a history of chronic BV in the past that she has used boric acid suppositories with some relief of symptoms but her vaginitis symptoms would return shortly after stopping suppressive treatment.    Depression screen [facility] 2/9 10/08/2019 05/24/2019 04/02/2019  Decreased Interest 2 1 2   Down, Depressed, Hopeless 1 1 2   PHQ - 2 Score 3 2 4   Altered sleeping 1 0 0  Tired, decreased energy 1 0 1  Change in appetite 2 2 2   Feeling bad or failure about yourself  0 0 0  Trouble concentrating 2 3 0  Moving slowly or fidgety/restless 3 0 0  Suicidal thoughts 0 0 0  PHQ-9 Score 12 7 7   Difficult doing work/chores Very difficult Somewhat difficult Somewhat difficult    Social History   Tobacco Use  . Smoking status: Current Every Day Smoker    Packs/day: 0.50    Years: 10.00    Pack years: 5.00    Types: Cigarettes  . Smokeless tobacco: Never Used  Substance Use Topics  . Alcohol use: Yes    Alcohol/week: 3.0 standard drinks    Types: 3 Cans of beer per week    Comment: 3 beers weekly  . Drug use: Yes    Types: Marijuana    Review of Systems  Constitutional: Negative.   HENT: Negative.   Eyes: Negative.   Respiratory: Negative.   Cardiovascular: Negative.   Gastrointestinal: Negative.   Endocrine: Negative.   Genitourinary: Positive for vaginal discharge. Negative for decreased urine volume, difficulty urinating, dyspareunia, dysuria, enuresis, flank  pain, frequency, genital sores, hematuria, menstrual problem, pelvic pain, urgency, vaginal bleeding and vaginal pain.  Musculoskeletal: Negative.   Skin: Negative.   Allergic/Immunologic: Negative.   Neurological: Negative.   Hematological: Negative.   Psychiatric/Behavioral: Negative.    Per HPI unless specifically indicated above     Objective:    BP 105/73 (BP Location: Left Arm, Patient Position: Sitting, Cuff Size: Normal)   Pulse (!) 58   Temp (!) 97.3 F (36.3 C) (Temporal)   Resp 16   Ht 5' (1.524 m)   Wt 156 lb (70.8 kg)   SpO2 100%   BMI 30.47 kg/m   Wt Readings from Last 3 Encounters:  10/08/19 156 lb (70.8 kg)  05/30/19 162 lb (73.5 kg)  05/24/19 161 lb (73 kg)    Physical Exam Vitals reviewed.  Constitutional:      General: She is not in acute distress.    Appearance: Normal appearance. She is well-developed and well-groomed. She is obese. She is not ill-appearing or toxic-appearing.  HENT:     Head: Normocephalic.  Eyes:     General: Lids are normal. Vision grossly intact.        Right eye: No discharge.        Left eye: No discharge.     Extraocular Movements: Extraocular movements intact.  Pupils: Pupils are equal, round, and reactive to light.     Comments: Slight yellowing to bilateral conjunctiva  Cardiovascular:     Rate and Rhythm: Normal rate and regular rhythm.     Pulses: Normal pulses.          Dorsalis pedis pulses are 2+ on the right side and 2+ on the left side.       Posterior tibial pulses are 2+ on the right side and 2+ on the left side.     Heart sounds: Normal heart sounds. No murmur. No friction rub. No gallop.   Pulmonary:     Effort: Pulmonary effort is normal. No respiratory distress.     Breath sounds: Normal breath sounds.  Abdominal:     General: Abdomen is flat. Bowel sounds are normal. There is no distension.     Palpations: Abdomen is soft.  Genitourinary:    General: Normal vulva.     Exam position: Lithotomy  position.     Pubic Area: No rash or pubic lice.      Labia:        Right: No rash, tenderness, lesion or injury.        Left: No rash, tenderness, lesion or injury.      Urethra: No urethral pain, urethral swelling or urethral lesion.     Vagina: No signs of injury and foreign body. Vaginal discharge present. No erythema, tenderness, bleeding or lesions.  Musculoskeletal:     Right lower leg: No edema.     Left lower leg: No edema.  Feet:     Right foot:     Skin integrity: Skin integrity normal.     Left foot:     Skin integrity: Skin integrity normal.  Skin:    General: Skin is warm and dry.     Capillary Refill: Capillary refill takes less than 2 seconds.  Neurological:     General: No focal deficit present.     Mental Status: She is alert and oriented to person, place, and time.     Cranial Nerves: No cranial nerve deficit.     Sensory: No sensory deficit.     Motor: No weakness.     Coordination: Coordination normal.     Gait: Gait normal.  Psychiatric:        Attention and Perception: Attention and perception normal.        Mood and Affect: Mood and affect normal.        Speech: Speech normal.        Behavior: Behavior normal. Behavior is cooperative.        Thought Content: Thought content normal.        Cognition and Memory: Cognition and memory normal.        Judgment: Judgment normal.     Results for orders placed or performed in visit on 10/08/19  POCT Urinalysis Dipstick  Result Value Ref Range   Color, UA Yellow    Clarity, UA Clear    Glucose, UA Negative Negative   Bilirubin, UA negative    Ketones, UA negative    Spec Grav, UA 1.015 1.010 - 1.025   Blood, UA negative    pH, UA 5.0 5.0 - 8.0   Protein, UA Negative Negative   Urobilinogen, UA 0.2 0.2 or 1.0 E.U./dL   Nitrite, UA negative    Leukocytes, UA Negative Negative   Appearance     Odor        Assessment & Plan:  Problem List Items Addressed This Visit      Genitourinary   Vaginitis     History of recurrent bacterial vaginosis.  Reports has used boric acid suppositories for up to 12 weeks.  Finds that she can sometimes go one month without recurrence of BV.  Swab taken today along with STI labs to be sent out for processing.  Will treat with Metronidazole 500mg  1 tablet 2x per day for the next 7 days.  Discussed suppressive therapy with metronidazole vaginal gel twice weekly for the next 4-6 months.   Plan: 1. Swab and blood work sent to the lab for STI, gardnerella and candida testing 2. Metronidazole 500mg  1 tablet 2x per day for the next 7 days sent to pharmacy 3. Metronidazole 7.51% gel, 1 applicator full to be inserted into the vagina twice weekly for the next 3 months. 4. Follow up in 3 months, sooner, should the need arise.      Relevant Medications   metroNIDAZOLE (FLAGYL) 500 MG tablet   Other Relevant Orders   Cervicovaginal ancillary only     Other   HSV-2 (herpes simplex virus 2) infection    No ulceration in the past, diagnosed by lab work only.  No outbreak.  Treating as if negative with positive exposure.      Relevant Medications   metroNIDAZOLE (FLAGYL) 500 MG tablet   Encounter to establish care with new doctor    Establishing as new patient at [facility] 10/08/2019.    Acute concerns for vaginal discharge today.  Slight yellowing of conjunctiva.  Will order labs today as well as STI labs.  Will schedule physical for yearly wellness visit in 3 months.       Other Visit Diagnoses    Encounter to establish care    -  Primary   Relevant Orders   POCT Urinalysis Dipstick (Completed)   CBC with Differential   COMPLETE METABOLIC PANEL WITH GFR   High risk homosexual behavior       Relevant Orders   HIV antibody (with reflex)   Hepatitis C Antibody   RPR   Bacterial vaginosis       Relevant Medications   metroNIDAZOLE (FLAGYL) 500 MG tablet   metroNIDAZOLE (METROGEL) 0.75 % gel      Meds ordered this encounter  Medications  . metroNIDAZOLE  (FLAGYL) 500 MG tablet    Sig: Take 1 tablet (500 mg total) by mouth 2 (two) times daily.    Dispense:  14 tablet    Refill:  0  . metroNIDAZOLE (METROGEL) 0.75 % gel    Sig: Apply 1 application topically 2 (two) times a week for 24 doses.    Dispense:  45 g    Refill:  2      Follow up plan: Return in about 3 months (around 01/07/2020) for Physical.   Harlin Rain, [place] Nurse Practitioner [facility] 10/08/2019, 10:38 AM

## 2019-10-08 NOTE — Assessment & Plan Note (Addendum)
Establishing as new patient at [facility] 10/08/2019.    Acute concerns for vaginal discharge today.  Slight yellowing of conjunctiva.  Will order labs today as well as STI labs.  Will schedule physical for yearly wellness visit in 3 months.

## 2019-10-08 NOTE — Patient Instructions (Signed)
As we discussed, have your labs drawn in the next 1-2 weeks and we will contact you with the results.  We are treating you today for bacterial vaginosis with metronidazole to take 1 tablet 2x per day for the next 7 days.  Avoid alcohol while taking this medication.  I have sent in a prescription for suppressive therapy, which is metronidazole gel to use twice weekly in the vagina for the next 3 months.  Suppressive therapy can be up to 6 months at a time, we will re-evaluate in 3 months how you are doing with this medication.  We will plan to see you back in 3 months for physical  You will receive a survey after today's visit either digitally by e-mail or paper by USPS mail. Your experiences and feedback matter to [place].  Please respond so we know how we are doing as we provide care for you.  Call us with any questions/concerns/needs.  It is my goal to be available to you for your health concerns.  Thanks for choosing me to be a partner in your healthcare needs!  Charlaine Dalton, FNP-C Family Nurse Practitioner [facility] Phone: [phone]

## 2019-10-08 NOTE — Assessment & Plan Note (Signed)
History of recurrent bacterial vaginosis.  Reports has used boric acid suppositories for up to 12 weeks.  Finds that she can sometimes go one month without recurrence of BV.  Swab taken today along with STI labs to be sent out for processing.  Will treat with Metronidazole 500mg  1 tablet 2x per day for the next 7 days.  Discussed suppressive therapy with metronidazole vaginal gel twice weekly for the next 4-6 months.   Plan: 1. Swab and blood work sent to the lab for STI, gardnerella and candida testing 2. Metronidazole 500mg  1 tablet 2x per day for the next 7 days sent to pharmacy 3. Metronidazole 0.75% gel, 1 applicator full to be inserted into the vagina twice weekly for the next 3 months. 4. Follow up in 3 months, sooner, should the need arise.

## 2019-10-08 NOTE — Assessment & Plan Note (Signed)
No ulceration in the past, diagnosed by lab work only.  No outbreak.  Treating as if negative with positive exposure.

## 2019-10-09 LAB — CBC WITH DIFFERENTIAL/PLATELET
Absolute Monocytes: 380 cells/uL (ref 200–950)
Basophils Absolute: 30 cells/uL (ref 0–200)
Eosinophils Absolute: 20 cells/uL (ref 15–500)
HCT: 44.3 % (ref 35.0–45.0)
Hemoglobin: 15 g/dL (ref 11.7–15.5)
Lymphs Abs: 1920 cells/uL (ref 850–3900)
MCH: 34 pg — ABNORMAL HIGH (ref 27.0–33.0)
MCV: 100.5 fL — ABNORMAL HIGH (ref 80.0–100.0)
MPV: 11.7 fL (ref 7.5–12.5)
Monocytes Relative: 7.6 %
Neutro Abs: 2650 cells/uL (ref 1500–7800)
Neutrophils Relative %: 53 %
Platelets: 186 10*3/uL (ref 140–400)
RBC: 4.41 10*6/uL (ref 3.80–5.10)
RDW: 12.8 % (ref 11.0–15.0)
Total Lymphocyte: 38.4 %
WBC: 5 10*3/uL (ref 3.8–10.8)

## 2019-10-09 LAB — COMPLETE METABOLIC PANEL WITH GFR
AG Ratio: 2 (calc) (ref 1.0–2.5)
ALT: 10 U/L (ref 6–29)
AST: 14 U/L (ref 10–30)
Albumin: 4.5 g/dL (ref 3.6–5.1)
Alkaline phosphatase (APISO): 66 U/L (ref 31–125)
BUN: 7 mg/dL (ref 7–25)
CO2: 24 mmol/L (ref 20–32)
Calcium: 9.7 mg/dL (ref 8.6–10.2)
Chloride: 109 mmol/L (ref 98–110)
Creat: 0.79 mg/dL (ref 0.50–1.10)
GFR, Est African American: 117 mL/min/{1.73_m2} (ref >=60)
GFR, Est Non African American: 101 mL/min/{1.73_m2} (ref >=60)
Globulin: 2.2 g/dL (calc) (ref 1.9–3.7)
Glucose, Bld: 93 mg/dL (ref 65–139)
Potassium: 4.5 mmol/L (ref 3.5–5.3)
Sodium: 138 mmol/L (ref 135–146)
Total Bilirubin: 0.7 mg/dL (ref 0.2–1.2)
Total Protein: 6.7 g/dL (ref 6.1–8.1)

## 2019-10-09 LAB — HEPATITIS C ANTIBODY
Hepatitis C Ab: NONREACTIVE
SIGNAL TO CUT-OFF: 0.02 (ref <1.00)

## 2019-10-09 LAB — RPR: RPR Ser Ql: NONREACTIVE

## 2019-10-09 LAB — HIV ANTIBODY (ROUTINE TESTING W REFLEX): HIV 1&2 Ab, 4th Generation: NONREACTIVE

## 2019-10-09 NOTE — Progress Notes (Signed)
Hep C non-reactive.  Awaiting cervicovaginal swab results.

## 2019-10-09 NOTE — Progress Notes (Signed)
CBC and CMP WNL.  HIV and RPR non-reactive.  Awaiting cervicovaginal swab results and Hepatitis C testing.

## 2019-10-09 NOTE — Progress Notes (Signed)
CBC and CMP within normal limits.  Awaiting rest of labs.

## 2019-10-10 LAB — CERVICOVAGINAL ANCILLARY ONLY
Bacterial Vaginitis (gardnerella): POSITIVE — AB
Candida Glabrata: NEGATIVE
Candida Vaginitis: NEGATIVE
Chlamydia: NEGATIVE
Comment: NEGATIVE
Comment: NEGATIVE
Comment: NEGATIVE
Comment: NEGATIVE
Comment: NEGATIVE
Comment: NORMAL
Neisseria Gonorrhea: NEGATIVE
Trichomonas: NEGATIVE

## 2019-10-11 IMAGING — MR MR HEAD WO/W CM
13 series · 48 of 48 positions shown · IV contrast (gadavist)
Comparison: None.

CLINICAL DATA: Blurry vision for 6 months. Papilledema and history
of migraines.

EXAM:
MRI HEAD WITHOUT AND WITH CONTRAST
TECHNIQUE: Multiplanar, multiecho pulse sequences of the brain and surrounding
structures were obtained without and with intravenous contrast.
CONTRAST:  7 mL Gadavist

[Series 5: ax dwi_tracew · axial · 3.0mm · 0.60mm/px · z∈[-81,+79]mm · 4 of 55 slices shown]
[im 1/55]
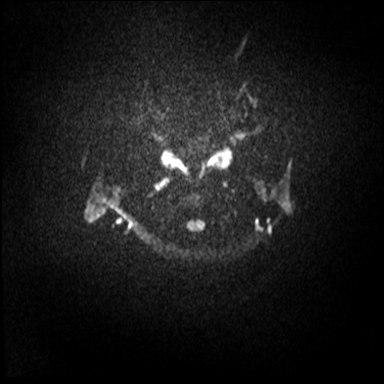
[im 19/55]
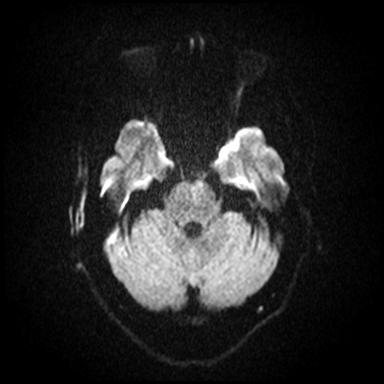
[im 37/55]
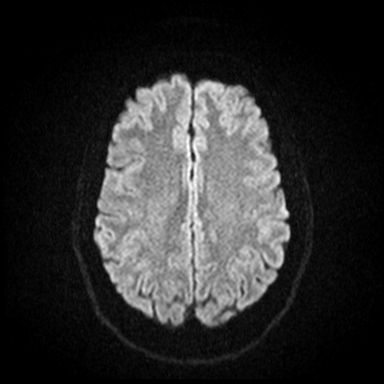
[im 55/55]
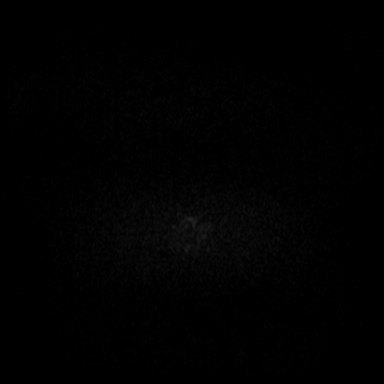

[Series 6: ax dwi_adc · axial · 3.0mm · 0.60mm/px · z∈[-81,+79]mm · 3 of 55 slices shown]
[im 1/55]
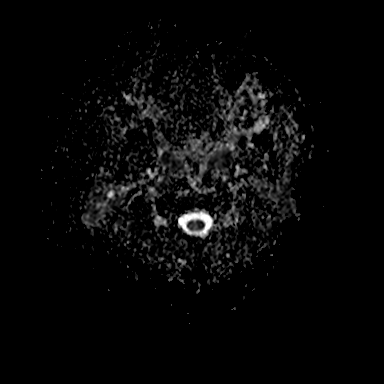
[im 28/55]
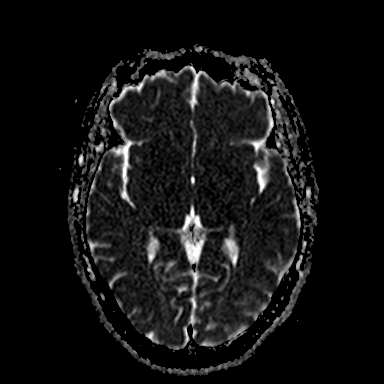
[im 55/55]
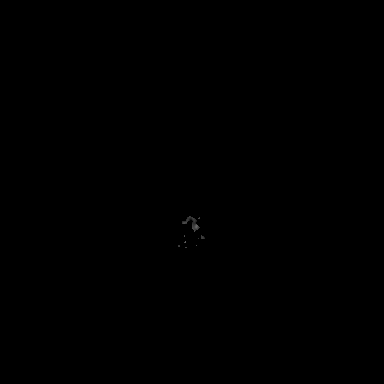

[Series 7: cor dwi_tracew · coronal · 5.0mm · 0.60mm/px · 2 of 39 slices shown]
[im 1/39]
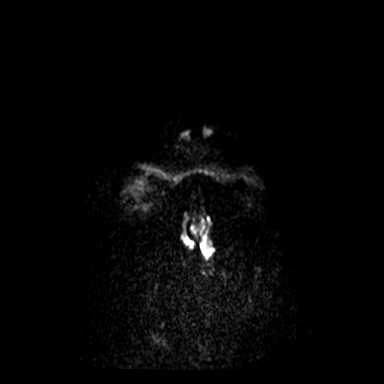
[im 39/39]
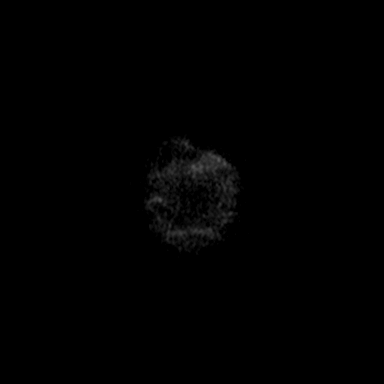

[Series 8: cor dwi_adc · coronal · 5.0mm · 0.60mm/px · 2 of 39 slices shown]
[im 1/39]
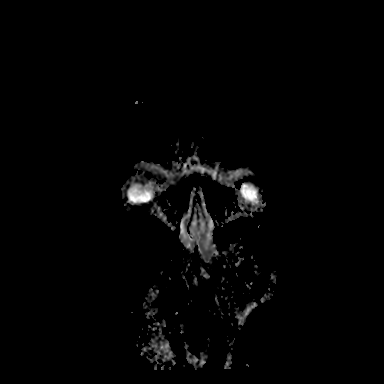
[im 39/39]
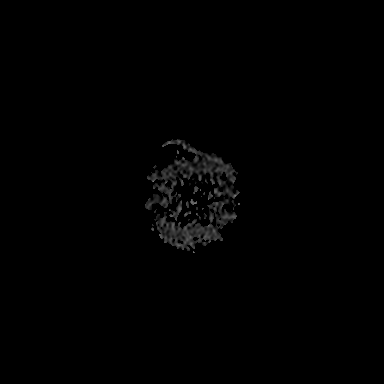

[Series 9: T1 · sagittal · 5.0mm · 0.62mm/px · 1 of 21 slices shown (1 of 2)]
[im 1/21]
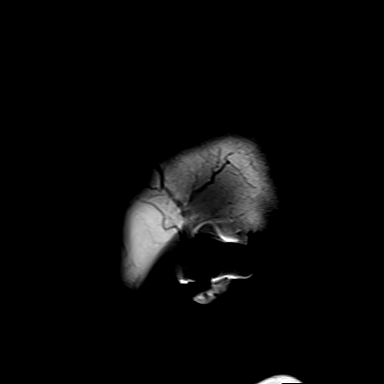

[Series 10: T2 · axial · 5.0mm · 0.53mm/px · z∈[-66,+77]mm · 2 of 25 slices shown]
[im 1/25]
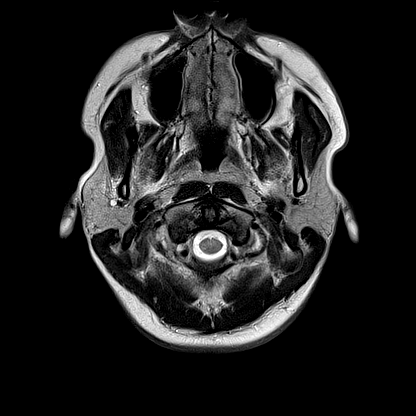
[im 25/25]
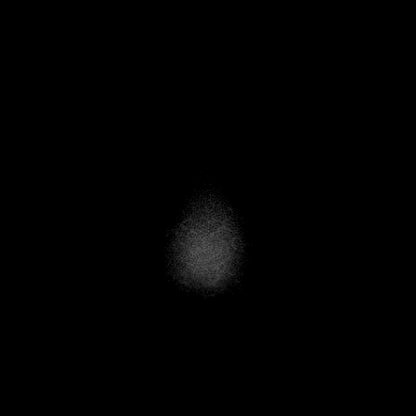

[Series 11: swi_images · axial · 3.0mm · 0.90mm/px · z∈[-82,+93]mm · 4 of 60 slices shown]
[im 1/60]
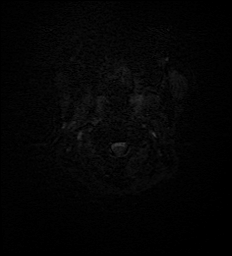
[im 20/60]
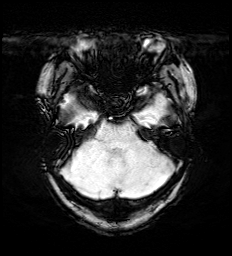
[im 40/60]
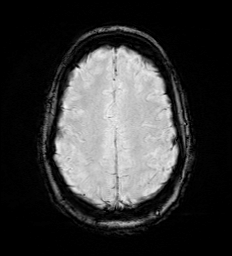
[im 60/60]
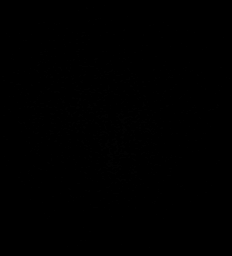

[Series 12: mip_images(sw) · axial · 24.0mm · 0.90mm/px · z∈[-72,+83]mm · 3 of 53 slices shown]
[im 1/53]
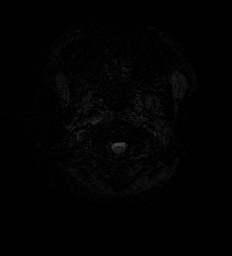
[im 27/53]
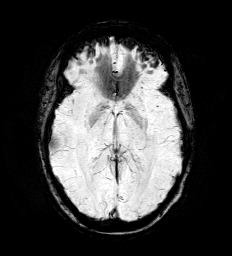
[im 53/53]
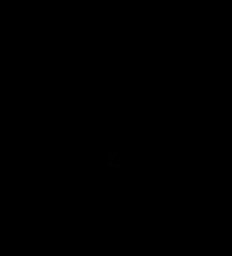

[Series 13: FLAIR · axial · 3.0mm · 0.53mm/px · z∈[-74,+86]mm · 3 of 55 slices shown]
[im 1/55]
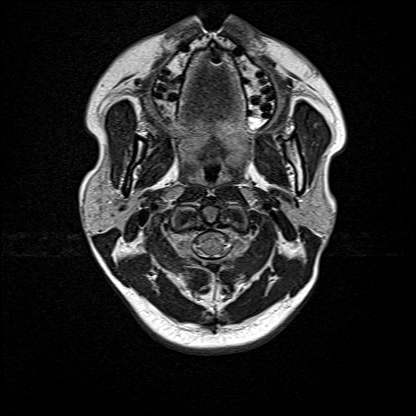
[im 28/55]
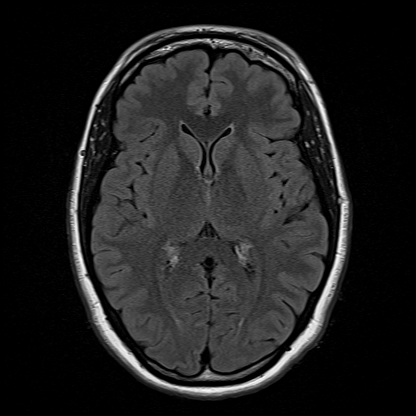
[im 55/55]
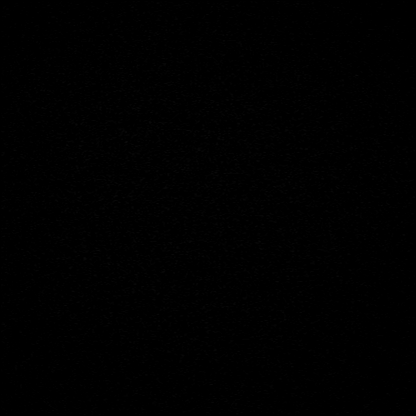

[Series 14: T1 · axial · 1.0mm · 0.98mm/px · z∈[-79,+79]mm · 10 of 160 slices shown (2 of 2)]
[im 1/160]
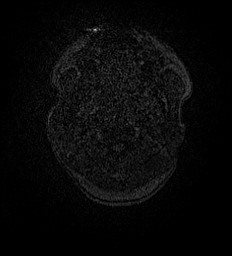
[im 18/160]
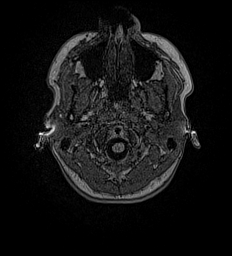
[im 36/160]
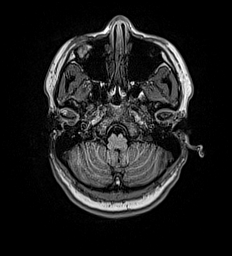
[im 54/160]
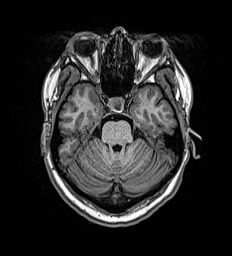
[im 71/160]
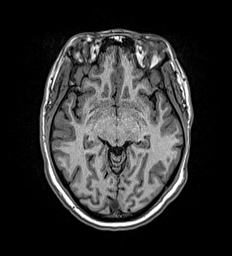
[im 89/160]
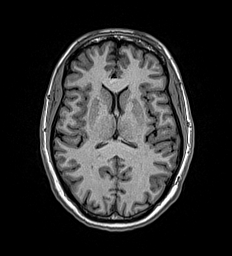
[im 107/160]
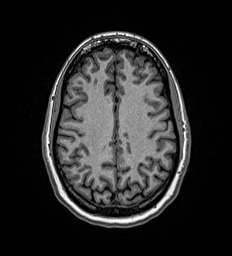
[im 124/160]
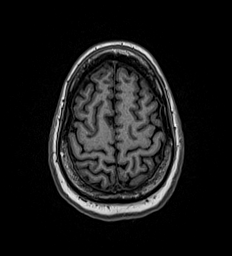
[im 142/160]
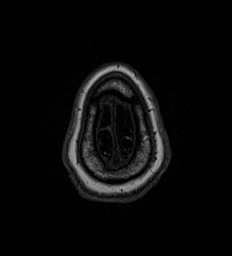
[im 160/160]
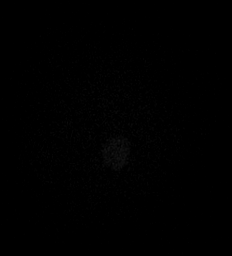

[Series 15: T2 post-contrast · coronal · 5.0mm · 0.57mm/px · 2 of 30 slices shown]
[im 1/30]
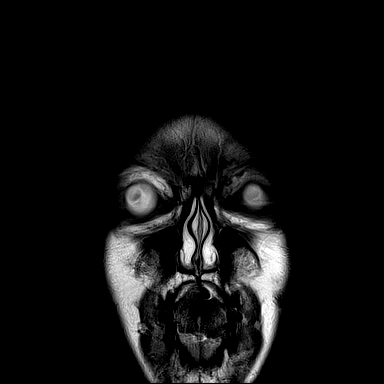
[im 30/30]
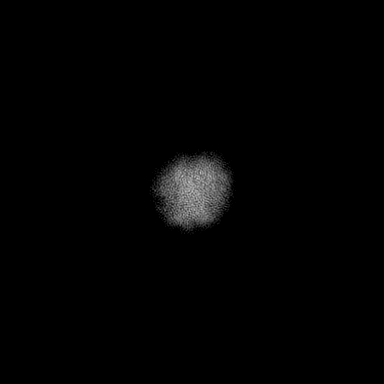

[Series 16: T1 post-contrast · axial · 1.0mm · 0.98mm/px · z∈[-79,+79]mm · 10 of 160 slices shown (1 of 2)]
[im 1/160]
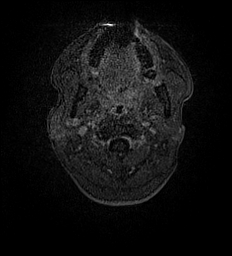
[im 18/160]
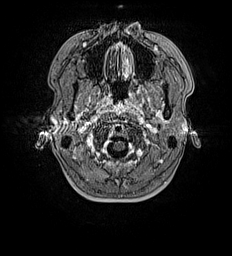
[im 36/160]
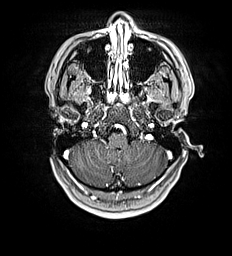
[im 54/160]
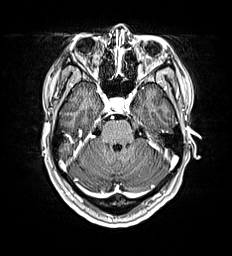
[im 71/160]
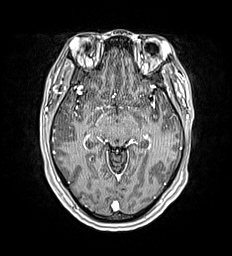
[im 89/160]
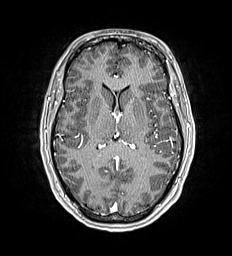
[im 107/160]
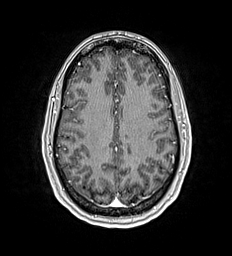
[im 124/160]
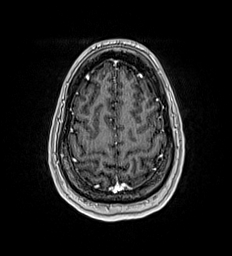
[im 142/160]
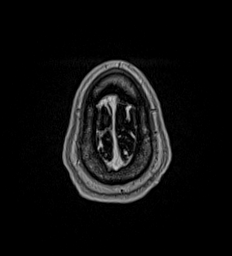
[im 160/160]
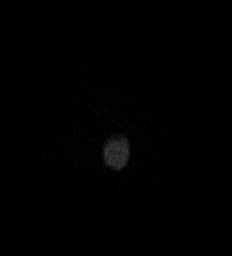

[Series 17: T1 post-contrast · coronal · 5.0mm · 0.57mm/px · 2 of 30 slices shown (2 of 2)]
[im 1/30]
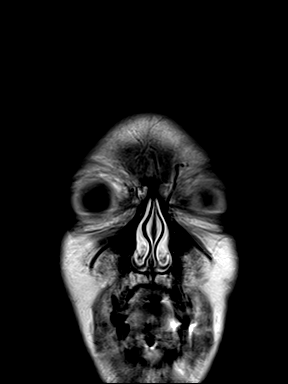
[im 30/30]
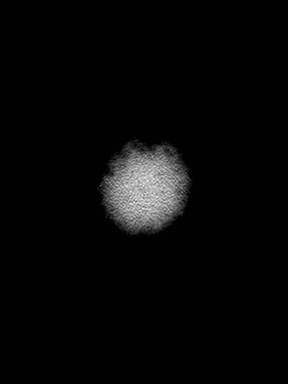

[48 of 48 positions shown; findings below may reference images not displayed]

FINDINGS: BRAIN: There is no acute infarct, acute hemorrhage, hydrocephalus or
extra-axial collection. The midline structures are normal. No
midline shift or other mass effect. There are no old infarcts. The
white matter signal is normal for the patient's age. The cerebral
and cerebellar volume are age-appropriate. Susceptibility-sensitive
sequences show no chronic microhemorrhage or superficial siderosis.
No abnormal contrast enhancement.

VASCULAR: Major intracranial arterial and venous sinus flow voids
are normal.

SKULL AND UPPER CERVICAL SPINE: Calvarial bone marrow signal is
normal. There is no skull base mass. Visualized upper cervical spine
and soft tissues are normal.

SINUSES/ORBITS: No fluid levels or advanced mucosal thickening. No
mastoid or middle ear effusion. The orbits are normal.
IMPRESSION: Normal brain MRI.

## 2019-11-12 ENCOUNTER — Ambulatory Visit (INDEPENDENT_AMBULATORY_CARE_PROVIDER_SITE_OTHER): Payer: Medicare Other | Admitting: Family Medicine

## 2019-11-12 ENCOUNTER — Encounter: Payer: Self-pay | Admitting: Family Medicine

## 2019-11-12 ENCOUNTER — Other Ambulatory Visit: Payer: Self-pay

## 2019-11-12 DIAGNOSIS — M549 Dorsalgia, unspecified: Secondary | ICD-10-CM | POA: Insufficient documentation

## 2019-11-12 DIAGNOSIS — S161XXA Strain of muscle, fascia and tendon at neck level, initial encounter: Secondary | ICD-10-CM | POA: Diagnosis not present

## 2019-11-12 DIAGNOSIS — T148XXA Other injury of unspecified body region, initial encounter: Secondary | ICD-10-CM

## 2019-11-12 DIAGNOSIS — F17210 Nicotine dependence, cigarettes, uncomplicated: Secondary | ICD-10-CM

## 2019-11-12 DIAGNOSIS — M545 Low back pain, unspecified: Secondary | ICD-10-CM

## 2019-11-12 MED ORDER — CYCLOBENZAPRINE HCL 10 MG PO TABS: 10.0000 mg | ORAL_TABLET | Freq: Three times a day (TID) | ORAL | 0 refills | Status: DC | PRN

## 2019-11-12 MED ORDER — NAPROXEN 500 MG PO TABS: 500.0000 mg | ORAL_TABLET | Freq: Two times a day (BID) | ORAL | 0 refills | Status: DC

## 2019-11-12 NOTE — Patient Instructions (Signed)
As we discussed, I have sent in a prescription for naproxen and cyclobenzaprine.  The Naproxen is to be taken twice per day, as needed for pain/inflammation and the cyclobenzaprine is to be taken up to 3x per day as needed for muscle cramp/spasm.  Be sure not to drive or operate heavy machinery while taking the cyclobenzaprine as it can be sedating/cause drowsiness.  Please schedule a follow-up appointment with me in 2 to 4 weeks if symptoms not improving, or sooner if worsening  If develop severe headaches, nausea / vomiting, loss of vision, numbness, weakness or tingling - call 911 or go immediately to ED.  As we discussed, can work on some neck stretches/exercises to help alleviate muscle tension/spasm.  These can be reviewed online or on YouTube by looking up "Dr. Alvino Chapel" or "Nadine Counts & Brad".  These are both physical therapists with online youtube channels and to be used at your own discretion.  You will receive a survey after today's visit either digitally by e-mail or paper by [employer]. Your experiences and feedback matter to [place].  Please respond so we know how we are doing as we provide care for you.  Call us with any questions/concerns/needs.  It is my goal to be available to you for your health concerns.  Thanks for choosing me to be a partner in your healthcare needs!  Charlaine Dalton, FNP-C Family Nurse Practitioner [facility] Phone: [phone]
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
## 2019-12-28 ENCOUNTER — Encounter: Payer: Self-pay | Admitting: Family Medicine

## 2019-12-28 ENCOUNTER — Other Ambulatory Visit (HOSPITAL_COMMUNITY)
Admission: RE | Admit: 2019-12-28 | Discharge: 2019-12-28 | Disposition: A | Discharge status: Home / Self Care | Payer: Medicare Other | Source: Ambulatory Visit | Attending: Family Medicine | Admitting: Family Medicine

## 2019-12-28 ENCOUNTER — Other Ambulatory Visit: Payer: Self-pay

## 2019-12-28 ENCOUNTER — Ambulatory Visit (INDEPENDENT_AMBULATORY_CARE_PROVIDER_SITE_OTHER): Payer: Medicare Other | Admitting: Family Medicine

## 2019-12-28 VITALS — BP 111/67 | HR 72 | Temp 97.5°F | Ht 60.0 in | Wt 149.6 lb

## 2019-12-28 DIAGNOSIS — B9689 Other specified bacterial agents as the cause of diseases classified elsewhere: Secondary | ICD-10-CM | POA: Diagnosis not present

## 2019-12-28 DIAGNOSIS — N760 Acute vaginitis: Secondary | ICD-10-CM

## 2019-12-28 DIAGNOSIS — R768 Other specified abnormal immunological findings in serum: Secondary | ICD-10-CM

## 2019-12-28 DIAGNOSIS — M545 Low back pain, unspecified: Secondary | ICD-10-CM

## 2019-12-28 DIAGNOSIS — L732 Hidradenitis suppurativa: Secondary | ICD-10-CM | POA: Insufficient documentation

## 2019-12-28 DIAGNOSIS — T148XXA Other injury of unspecified body region, initial encounter: Secondary | ICD-10-CM

## 2019-12-28 LAB — POCT URINALYSIS DIPSTICK
Bilirubin, UA: NEGATIVE
Blood, UA: NEGATIVE
Glucose, UA: NEGATIVE
Ketones, UA: NEGATIVE
Leukocytes, UA: NEGATIVE
Nitrite, UA: NEGATIVE
Protein, UA: NEGATIVE
Spec Grav, UA: 1.01 (ref 1.010–1.025)
Urobilinogen, UA: 0.2 E.U./dL
pH, UA: 5 (ref 5.0–8.0)

## 2019-12-28 MED ORDER — DOXYCYCLINE HYCLATE 100 MG PO TABS: 100.0000 mg | ORAL_TABLET | Freq: Two times a day (BID) | ORAL | 0 refills | Status: DC

## 2019-12-28 MED ORDER — METRONIDAZOLE 500 MG PO TABS: 500.0000 mg | ORAL_TABLET | Freq: Two times a day (BID) | ORAL | 0 refills | Status: AC

## 2019-12-28 MED ORDER — CYCLOBENZAPRINE HCL 10 MG PO TABS: 10.0000 mg | ORAL_TABLET | Freq: Three times a day (TID) | ORAL | 0 refills | Status: DC | PRN

## 2019-12-28 NOTE — Assessment & Plan Note (Signed)
White/creamy vaginal discharge with odor x 4 days.  Reports history of chronic BV, has recently started on metrogel suppressive therapy, had gone out of town and missed a weeks worth of dosing and her symptoms started shortly afterwards.  Is interested in switching to boric acid suppositories.  Likely BV on exam, swab sent to lab for confirmation.  Will treat with oral metronidazole 500mg  BID x 7 days and then to begin boric acid suppositories.  Plan: 1. Vaginal swab sent to lab for testing 2. Begin metronidazole 500mg  BID x 7 days 3. Begin boric acid supplements after completing antibiotics 4. F/U as needed

## 2019-12-28 NOTE — Assessment & Plan Note (Signed)
Seropositive on labs from [facility] >3 years ago.  No lesions or confirmed HSV2 via swab.  Likely antibodies to exposure.

## 2019-12-28 NOTE — Patient Instructions (Addendum)
I have sent in a prescription for metronidazole 500mg  to take 1 tablet 2x per day for the next 7 days for your bacterial vaginosis.  We have sent your sample to the lab and will contact you once it is resulted.  I have sent in a prescription for doxycycline 100mg  to take 1 tablet 2x per day for the next 10 days, AFTER completing the metronidazole prescription for your genital skin hidradenitis supportiva.  A referral to dermatology has been placed today.  If you have not heard from the specialty office or our referral coordinator within 1 week, please let know and we will follow up with the referral coordinator for an update.  I have placed a refill on your cyclobenzaprine for lower back cramp/spasm  We will plan to see you back as needed for this  You will receive a survey after today's visit either digitally by e-mail or paper by . Your experiences and feedback matter to [place].  Please respond so we know how we are doing as we provide care for you.  Call [employer] with any questions/concerns/needs.  It is my goal to be available to you for your health concerns.  Thanks for choosing me to be a partner in your healthcare needs!  [place], FNP-C Family Nurse Practitioner [facility] Phone: [phone]

## 2019-12-28 NOTE — Assessment & Plan Note (Signed)
Discussed muscle strain s/p falling when skating.  Can refill cyclobenzaprine to use with naproxen.  Continue to work on back exercises.    Plan: 1. Refill cyclobenzaprine sent to pharmacy on file 2. Continue back exercises 3. Follow up in 2-4 weeks if no improvement in symptoms

## 2019-12-28 NOTE — Assessment & Plan Note (Addendum)
Likely hidradenitis suppurativa based on chronicity of skin "sores" that reoccur.  On exam today, multiple small pockets of abscesses noted along right side of genital skin without inflammation or drainage.  Will refer to dermatology for second opinion, per patient request.  Will start on short duration of doxycycline to help with resolving.  Plan: 1. Begin doxycycline 1 tablet 2x per day for the next 10 days 2. Referral to dermatology placed.

## 2019-12-28 NOTE — Progress Notes (Signed)
Subjective:    Patient ID: Ellen Kaiser, female    DOB: [birth year], 29 y.o.   MRN: [id number]  Romonda Parker Nephew is a 29 y.o. female presenting on 12/28/2019 for Vaginal Discharge (whitish creamy discharge w/ foul odor x 4 days. [place] of BV. Pt currently uses metrinazole gel twice a week for preventative, but admits using it for about a week. ) and Back Pain (pt state she went skating x 2 days ago and fell and injured her back left hand wrist and right knee ad back. )   HPI  Ms. Mcevoy presents to clinic for concerns of white/creamy vaginal discharge with odor x 4 days.  Reports history of chronic BV, has recently started on metrogel suppressive therapy, had gone out of town and missed a weeks worth of dosing and her symptoms started shortly afterwards.  Is interested in switching to boric acid suppositories.  Has concern for recurrent ingrown hair in genital area.  Reports she stopped shaving this area with a razor, but still continues to have bumps with drainage.  States this has been chronic and had met with dermatology in the past, but did not end up with a treatment plan.  Is requesting referral for second opinion.  Has acute flare of back pain.  Went skating 2 days ago and feels has strained her lower back.  Requesting refill on cyclobenzaprine.  Denies numbness, tingling, weakness, loss of bowel/bladder or saddle anesthesia.  Depression screen [facility] 2/9 10/08/2019 05/24/2019 04/02/2019  Decreased Interest _0 Down, Depressed, Hopeless _1 PHQ - 2 Score _2 Altered sleeping 1 0 0  Tired, decreased energy 1 0 1  Change in appetite _3 Feeling bad or failure about yourself  0 0 0  Trouble concentrating 2 3 0  Moving slowly or fidgety/restless 3 0 0  Suicidal thoughts 0 0 0  PHQ-9 Score _4 Difficult doing work/chores Very difficult Somewhat difficult Somewhat difficult    Social History   Tobacco Use  . Smoking status: Current Every Day Smoker     Packs/day: 0.50    Years: 10.00    Pack years: 5.00    Types: Cigarettes  . Smokeless tobacco: Never Used  Vaping Use  . Vaping Use: Some days  . Substances: THC  Substance Use Topics  . Alcohol use: Yes    Alcohol/week: 3.0 standard drinks    Types: 3 Cans of beer per week    Comment: 3 beers weekly  . Drug use: Yes    Types: Marijuana    Review of Systems  Constitutional: Negative.   HENT: Negative.   Eyes: Negative.   Respiratory: Negative.   Cardiovascular: Negative.   Gastrointestinal: Negative.   Endocrine: Negative.   Genitourinary: Positive for vaginal discharge. Negative for decreased urine volume, difficulty urinating, dyspareunia, dysuria, enuresis, flank pain, frequency, genital sores, hematuria, menstrual problem, pelvic pain, urgency, vaginal bleeding and vaginal pain.  Musculoskeletal: Positive for back pain. Negative for arthralgias, gait problem, joint swelling, myalgias, neck pain and neck stiffness.  Skin: Positive for rash. Negative for color change, pallor and wound.  Allergic/Immunologic: Negative.   Neurological: Negative.   Hematological: Negative.   Psychiatric/Behavioral: Negative.    Per HPI unless specifically indicated above     Objective:    BP 111/67 (BP Location: Left Arm, Patient Position: Sitting, Cuff Size: Normal)   Pulse 72   Temp (!) 97.5 F (36.4 C) (Temporal)  Ht 5' (1.524 m)   Wt 149 lb 9.6 oz (67.9 kg)   BMI 29.22 kg/m   Wt Readings from Last 3 Encounters:  12/28/19 149 lb 9.6 oz (67.9 kg)  10/08/19 156 lb (70.8 kg)  05/30/19 162 lb (73.5 kg)    Physical Exam Vitals reviewed.  Constitutional:      General: She is not in acute distress.    Appearance: Normal appearance. She is well-developed, well-groomed and overweight. She is not ill-appearing or toxic-appearing.  HENT:     Head: Normocephalic and atraumatic.     Nose:     Comments: Lizbeth Bark is in place, covering mouth and nose. Eyes:     General: Lids are  normal. Vision grossly intact.        Right eye: No discharge.        Left eye: No discharge.     Extraocular Movements: Extraocular movements intact.     Conjunctiva/sclera: Conjunctivae normal.     Pupils: Pupils are equal, round, and reactive to light.  Cardiovascular:     Rate and Rhythm: Normal rate and regular rhythm.     Pulses: Normal pulses.          Dorsalis pedis pulses are 2+ on the right side and 2+ on the left side.     Heart sounds: Normal heart sounds. No murmur heard.  No friction rub. No gallop.   Pulmonary:     Effort: Pulmonary effort is normal. No respiratory distress.     Breath sounds: Normal breath sounds.  Abdominal:     Hernia: There is no hernia in the left inguinal area or right inguinal area.  Genitourinary:    Exam position: Lithotomy position.     Pubic Area: Rash present. No pubic lice.      Labia:        Right: No rash, tenderness, lesion or injury.        Left: No rash, tenderness, lesion or injury.      Urethra: No urethral pain, urethral swelling or urethral lesion.     Vagina: No signs of injury and foreign body. Vaginal discharge present. No erythema, tenderness, bleeding or lesions.  Musculoskeletal:     Right lower leg: No edema.     Left lower leg: No edema.  Lymphadenopathy:     Lower Body: No right inguinal adenopathy. No left inguinal adenopathy.  Skin:    General: Skin is warm and dry.     Capillary Refill: Capillary refill takes less than 2 seconds.     Findings: Rash present.  Neurological:     General: No focal deficit present.     Mental Status: She is alert and oriented to person, place, and time.  Psychiatric:        Attention and Perception: Attention and perception normal.        Mood and Affect: Mood and affect normal.        Speech: Speech normal.        Behavior: Behavior normal. Behavior is cooperative.        Thought Content: Thought content normal.        Cognition and Memory: Cognition and memory normal.         Judgment: Judgment normal.     Results for orders placed or performed in visit on 12/28/19  POCT Urinalysis Dipstick  Result Value Ref Range   Color, UA Yellow    Clarity, UA clear    Glucose, UA Negative Negative  Bilirubin, UA negative    Ketones, UA negative    Spec Grav, UA 1.010 1.010 - 1.025   Blood, UA negative    pH, UA 5.0 5.0 - 8.0   Protein, UA Negative Negative   Urobilinogen, UA 0.2 0.2 or 1.0 E.U./dL   Nitrite, UA negative    Leukocytes, UA Negative Negative   Appearance     Odor        Assessment & Plan:   Problem List Items Addressed This Visit      Musculoskeletal and Integument   Hidradenitis suppurativa    Likely hidradenitis suppurativa based on chronicity of skin "sores" that reoccur.  On exam today, multiple small pockets of abscesses noted along right side of genital skin without inflammation or drainage.  Will refer to dermatology for second opinion, per patient request.  Will start on short duration of doxycycline to help with resolving.  Plan: 1. Begin doxycycline 1 tablet 2x per day for the next 10 days 2. Referral to dermatology placed.      Relevant Medications   metroNIDAZOLE (FLAGYL) 500 MG tablet   doxycycline (VIBRA-TABS) 100 MG tablet   Other Relevant Orders   Ambulatory referral to Dermatology     Genitourinary   Vaginitis - Primary    White/creamy vaginal discharge with odor x 4 days.  Reports history of chronic BV, has recently started on metrogel suppressive therapy, had gone out of town and missed a weeks worth of dosing and her symptoms started shortly afterwards.  Is interested in switching to boric acid suppositories.  Likely BV on exam, swab sent to lab for confirmation.  Will treat with oral metronidazole 566m BID x 7 days and then to begin boric acid suppositories.  Plan: 1. Vaginal swab sent to lab for testing 2. Begin metronidazole 5028mBID x 7 days 3. Begin boric acid supplements after completing antibiotics 4. F/U  as needed       Relevant Orders   Cervicovaginal ancillary only     Other   HSV-2 seropositive    Seropositive on labs from [facility] >3 years ago.  No lesions or confirmed HSV2 via swab.  Likely antibodies to exposure.      Relevant Medications   metroNIDAZOLE (FLAGYL) 500 MG tablet   Back pain    Discussed muscle strain s/p falling when skating.  Can refill cyclobenzaprine to use with naproxen.  Continue to work on back exercises.    Plan: 1. Refill cyclobenzaprine sent to pharmacy on file 2. Continue back exercises 3. Follow up in 2-4 weeks if no improvement in symptoms      Relevant Medications   cyclobenzaprine (FLEXERIL) 10 MG tablet    Other Visit Diagnoses    BV (bacterial vaginosis)       Relevant Medications   metroNIDAZOLE (FLAGYL) 500 MG tablet   Other Relevant Orders   POCT Urinalysis Dipstick (Completed)   Muscle strain       Relevant Medications   cyclobenzaprine (FLEXERIL) 10 MG tablet      Meds ordered this encounter  Medications  . metroNIDAZOLE (FLAGYL) 500 MG tablet    Sig: Take 1 tablet (500 mg total) by mouth 2 (two) times daily for 7 days.    Dispense:  14 tablet    Refill:  0  . doxycycline (VIBRA-TABS) 100 MG tablet    Sig: Take 1 tablet (100 mg total) by mouth 2 (two) times daily.    Dispense:  20 tablet    Refill:  0  . cyclobenzaprine (FLEXERIL) 10 MG tablet    Sig: Take 1 tablet (10 mg total) by mouth 3 (three) times daily as needed for muscle spasms.    Dispense:  30 tablet    Refill:  0      Follow up plan: Return if symptoms worsen or fail to improve.   Harlin Rain, [place] Family Nurse Practitioner [facility] 12/28/2019, 11:49 AM

## 2020-01-01 LAB — CERVICOVAGINAL ANCILLARY ONLY
Bacterial Vaginitis (gardnerella): POSITIVE — AB
Candida Glabrata: NEGATIVE
Candida Vaginitis: NEGATIVE
Comment: NEGATIVE
Comment: NEGATIVE
Comment: NEGATIVE

## 2020-01-07 ENCOUNTER — Other Ambulatory Visit: Payer: Self-pay

## 2020-01-07 ENCOUNTER — Encounter: Payer: Self-pay | Admitting: Family Medicine

## 2020-01-07 ENCOUNTER — Ambulatory Visit (INDEPENDENT_AMBULATORY_CARE_PROVIDER_SITE_OTHER): Payer: Medicare Other | Admitting: Family Medicine

## 2020-01-07 VITALS — BP 109/62 | HR 72 | Temp 97.3°F | Resp 17 | Ht 60.0 in | Wt 154.8 lb

## 2020-01-07 DIAGNOSIS — F419 Anxiety disorder, unspecified: Secondary | ICD-10-CM | POA: Diagnosis not present

## 2020-01-07 DIAGNOSIS — Z0000 Encounter for general adult medical examination without abnormal findings: Secondary | ICD-10-CM | POA: Diagnosis not present

## 2020-01-07 DIAGNOSIS — F319 Bipolar disorder, unspecified: Secondary | ICD-10-CM

## 2020-01-07 DIAGNOSIS — F320 Major depressive disorder, single episode, mild: Secondary | ICD-10-CM | POA: Diagnosis not present

## 2020-01-07 NOTE — Progress Notes (Signed)
Subjective:    Patient ID: Ellen Kaiser, female    DOB: 1990-07-05, 29 y.o.   MRN: [id number]  Ellen Kaiser is a 29 y.o. female presenting on 01/07/2020 for Annual Exam   HPI  HEALTH MAINTENANCE:  Weight/BMI: Obese, BMI 30.23 Physical activity: Stays active Diet: Regular Seatbelt: Always Sunscreen: No, will work on wearing PAP: Completed 12/27/2018 with [facility] Screening: Completed 10/08/2019 GC/CT: Negative 12/28/2019 Optometry: Yearly Dentistry: Regularly  IMMUNIZATIONS: Influenza: Due next season Tetanus: Up to date, 08/15/2015 COVID: [place], completed, 12/04/2019 and 12/27/2019 Pneumococcal: Pneumococcal 23 - 03/21/2015  Depression screen [facility] 2/9 01/07/2020 10/08/2019 05/24/2019  Decreased Interest 2 2 1   Down, Depressed, Hopeless 2 1 1   PHQ - 2 Score 4 3 2   Altered sleeping 1 1 0  Tired, decreased energy 1 1 0  Change in appetite 2 2 2   Feeling bad or failure about yourself  0 0 0  Trouble concentrating 1 2 3   Moving slowly or fidgety/restless 1 3 0  Suicidal thoughts 1 0 0  PHQ-9 Score 11 12 7   Difficult doing work/chores Not difficult at all Very difficult Somewhat difficult    Past Medical History:  Diagnosis Date  . Allergy   . Anxiety   . Asthma   . Bipolar 1 disorder ([place])   . BV (bacterial vaginosis)   . Depression   . HSV-2 seropositive    History reviewed. No pertinent surgical history. Social History   Socioeconomic History  . Marital status: Single    Spouse name: Not on file  . Number of children: Not on file  . Years of education: Not on file  . Highest education level: Not on file  Occupational History  . Occupation: disabled    Comment: PTSD  Tobacco Use  . Smoking status: Current Every Day Smoker    Packs/day: 0.50    Years: 10.00    Pack years: 5.00    Types: Cigarettes  . Smokeless tobacco: Never Used  Vaping Use  . Vaping Use: Some days  . Substances: THC  Substance and Sexual Activity    . Alcohol use: Yes    Alcohol/week: 3.0 standard drinks    Types: 3 Cans of beer per week    Comment: 3 beers weekly  . Drug use: Yes    Types: Marijuana  . Sexual activity: Yes    Partners: Male    Birth control/protection: I.U.D.    Comment: Mirena  Other Topics Concern  . Not on file  Social History Narrative  . Not on file   Social Determinants of Health   Financial Resource Strain:   . Difficulty of Paying Living Expenses:   Food Insecurity:   . Worried About Charity fundraiser in the Last Year:   . Arboriculturist in the Last Year:   Transportation Needs:   . Film/video editor (Medical):   Marland Kitchen Lack of Transportation (Non-Medical):   Physical Activity:   . Days of Exercise per Week:   . Minutes of Exercise per Session:   Stress:   . Feeling of Stress :   Social Connections:   . Frequency of Communication with Friends and Family:   . Frequency of Social Gatherings with Friends and Family:   . Attends Religious Services:   . Active Member of Clubs or Organizations:   . Attends Archivist Meetings:   Marland Kitchen Marital Status:   Intimate Partner Violence:   .  Fear of Current or Ex-Partner:   . Emotionally Abused:   Marland Kitchen Physically Abused:   . Sexually Abused:    Family History  Problem Relation Age of Onset  . Depression Mother   . Alcohol abuse Mother   . Bipolar disorder Mother    Current Outpatient Medications on File Prior to Visit  Medication Sig  . ARIPiprazole (ABILIFY) 15 MG tablet Take by mouth.  . clonazePAM (KLONOPIN) 1 MG tablet Take 1 tablet by mouth 2 (two) times daily.  . cyclobenzaprine (FLEXERIL) 10 MG tablet Take 1 tablet (10 mg total) by mouth 3 (three) times daily as needed for muscle spasms.  . hydrOXYzine (ATARAX/VISTARIL) 50 MG tablet Take 100 mg by mouth daily.   Marland Kitchen levonorgestrel (MIRENA) 20 MCG/24HR IUD 1 each by Intrauterine route once.  . naproxen (NAPROSYN) 500 MG tablet Take 1 tablet (500 mg total) by mouth 2 (two) times daily  with a meal.  . doxycycline (VIBRA-TABS) 100 MG tablet Take 1 tablet (100 mg total) by mouth 2 (two) times daily. (Patient not taking: Reported on 01/07/2020)   No current facility-administered medications on file prior to visit.    Per HPI unless specifically indicated above     Objective:    BP 109/62 (BP Location: Left Arm, Patient Position: Sitting, Cuff Size: Normal)   Pulse 72   Temp (!) 97.3 F (36.3 C) (Temporal)   Resp 17   Ht 5' (1.524 m)   Wt 154 lb 12.8 oz (70.2 kg)   SpO2 100%   BMI 30.23 kg/m   Wt Readings from Last 3 Encounters:  01/07/20 154 lb 12.8 oz (70.2 kg)  12/28/19 149 lb 9.6 oz (67.9 kg)  10/08/19 156 lb (70.8 kg)   Physical Exam Vitals reviewed.  Constitutional:      General: She is not in acute distress.    Appearance: Normal appearance. She is well-developed and well-groomed. She is obese. She is not ill-appearing or toxic-appearing.  HENT:     Head: Normocephalic and atraumatic.     Right Ear: Tympanic membrane, ear canal and external ear normal. There is no impacted cerumen.     Left Ear: Tympanic membrane, ear canal and external ear normal. There is no impacted cerumen.     Nose: Nose normal.     Mouth/Throat:     Lips: Pink.     Mouth: Mucous membranes are moist.     Pharynx: Oropharynx is clear. Uvula midline.  Eyes:     General: Lids are normal. Vision grossly intact. No scleral icterus.       Right eye: No discharge.        Left eye: No discharge.     Extraocular Movements: Extraocular movements intact.     Conjunctiva/sclera: Conjunctivae normal.     Pupils: Pupils are equal, round, and reactive to light.  Neck:     Thyroid: No thyroid mass or thyromegaly.  Cardiovascular:     Rate and Rhythm: Normal rate and regular rhythm.     Pulses: Normal pulses.          Dorsalis pedis pulses are 2+ on the right side and 2+ on the left side.     Heart sounds: Normal heart sounds. No murmur heard.  No friction rub. No gallop.   Pulmonary:       Effort: Pulmonary effort is normal. No respiratory distress.     Breath sounds: Normal breath sounds.  Abdominal:     General: Abdomen is flat. Bowel  sounds are normal. There is no distension.     Palpations: Abdomen is soft. There is no hepatomegaly, splenomegaly or mass.     Tenderness: There is no abdominal tenderness. There is no guarding or rebound.     Hernia: No hernia is present.  Musculoskeletal:        General: Normal range of motion.     Cervical back: Normal range of motion and neck supple. No tenderness.     Right lower leg: No edema.     Left lower leg: No edema.     Comments: Normal tone, strength 5/5 BUE & BLE  Feet:     Right foot:     Skin integrity: Skin integrity normal.     Left foot:     Skin integrity: Skin integrity normal.  Lymphadenopathy:     Cervical: No cervical adenopathy.  Skin:    General: Skin is warm and dry.     Capillary Refill: Capillary refill takes less than 2 seconds.  Neurological:     General: No focal deficit present.     Mental Status: She is alert and oriented to person, place, and time.     Cranial Nerves: No cranial nerve deficit.     Sensory: No sensory deficit.     Motor: No weakness.     Coordination: Coordination normal.     Gait: Gait normal.     Deep Tendon Reflexes: Reflexes normal.  Psychiatric:        Attention and Perception: Attention and perception normal.        Mood and Affect: Mood and affect normal.        Speech: Speech normal.        Behavior: Behavior normal. Behavior is cooperative.        Thought Content: Thought content normal.        Cognition and Memory: Cognition and memory normal.        Judgment: Judgment normal.     Results for orders placed or performed in visit on 12/28/19  POCT Urinalysis Dipstick  Result Value Ref Range   Color, UA Yellow    Clarity, UA clear    Glucose, UA Negative Negative   Bilirubin, UA negative    Ketones, UA negative    Spec Grav, UA 1.010 1.010 - 1.025    Blood, UA negative    pH, UA 5.0 5.0 - 8.0   Protein, UA Negative Negative   Urobilinogen, UA 0.2 0.2 or 1.0 E.U./dL   Nitrite, UA negative    Leukocytes, UA Negative Negative   Appearance     Odor    Cervicovaginal ancillary only  Result Value Ref Range   Bacterial Vaginitis (gardnerella) Positive (A)    Candida Vaginitis Negative    Candida Glabrata Negative    Comment      Normal Reference Range Bacterial Vaginosis - Negative   Comment Normal Reference Range Candida Species - Negative    Comment Normal Reference Range Candida Galbrata - Negative       Assessment & Plan:   Problem List Items Addressed This Visit      Other   Bipolar 1 disorder ([place]) - Primary    See depression A/P      Relevant Orders   Ambulatory referral to Psychiatry   Anxiety    See depression A/P      Relevant Orders   Ambulatory referral to Psychiatry   Depression, major, single episode, mild ([place])    PHQ9-11/GAD7-17.  Currently following with a therapist and has met with [facility] in the past for psychiatry.  Requesting referral to establish local with psychiatry and therapy.  No SI/HI.  Referral to psychiatry and contact information for [facility] provided as well.  Plan: 1. Continue medications as prescribed by [facility] 2. Referral placed to psychiatry and counseling 3. RTC in 3 months      Relevant Orders   Ambulatory referral to Psychiatry   Routine medical exam    Annual physical exam without new findings.  Well adult with no acute concerns.  Plan: 1. Obtain health maintenance screenings as above according to age. - Increase physical activity to 30 minutes most days of the week.  - Eat healthy diet high in vegetables and fruits; low in refined carbohydrates. - Screening labs and tests as ordered 2. Return 1 year for annual physical.          No orders of the defined types were placed in this encounter.     Follow up plan: Return in about 1 year  (around 01/06/2021) for CPE.  Harlin Rain, FNP-C Family Nurse Practitioner [facility] 01/07/2020, 11:00 AM

## 2020-01-07 NOTE — Assessment & Plan Note (Signed)
See depression A/P. 

## 2020-01-07 NOTE — Patient Instructions (Addendum)
A referral to Psychiatry has been placed today.  If you have not heard from the specialty office or our referral coordinator within 1 week, please let us know and we will follow up with the referral coordinator for an update.  Can contact center for emotional health in [place] or [place]. [phone]  Well Visit: Care Instructions Overview  Well visits can help you stay healthy. Your provider has checked your overall health and may have suggested ways to take good care of yourself. Your provider also may have recommended tests. At home, you can help prevent illness with healthy eating, regular exercise, and other steps.  Follow-up care is a key part of your treatment and safety. Be sure to make and go to all appointments, and call your provider if you are having problems. It's also a good idea to know your test results and keep a list of the medicines you take.  How can you care for yourself at home?   Get screening tests that you and your doctor decide on. Screening helps find diseases before any symptoms appear.   Eat healthy foods. Choose fruits, vegetables, whole grains, protein, and low-fat dairy foods. Limit fat, especially saturated fat. Reduce salt in your diet.   Limit alcohol. If you are a man, have no more than 2 drinks a day or 14 drinks a week. If you are a woman, have no more than 1 drink a day or 7 drinks a week.   Get at least 30 minutes of physical activity on most days of the week.  We recommend you go no more than 2 days in a row without exercise. Walking is a good choice. You also may want to do other activities, such as running, swimming, cycling, or playing tennis or team sports. Discuss any changes in your exercise program with your provider.   Reach and stay at a healthy weight. This will lower your risk for many problems, such as obesity, diabetes, heart disease, and high blood pressure.   Do not smoke or allow others to smoke around you. If you need help  quitting, talk to your provider about stop-smoking programs and medicines. These can increase your chances of quitting for good.  Can call 1-800-QUIT-NOW (320-784-4084) for the [facility], assistance with smoking cessation.   Care for your mental health. It is easy to get weighed down by worry and stress. Learn strategies to manage stress, like deep breathing and mindfulness, and stay connected with your family and community. If you find you often feel sad or hopeless, talk with your provider. Treatment can help.   Talk to your provider about whether you have any risk factors for sexually transmitted infections (STIs). You can help prevent STIs if you wait to have sex with a new partner (or partners) until you've each been tested for STIs. It also helps if you use condoms (female or female condoms) and if you limit your sex partners to one person who only has sex with you. Vaccines are available for some STIs, such as HPV (these are age dependent).   Use birth control if it's important to you to prevent pregnancy. Talk with your provider about the choices available and what might be best for you.   If you think you may have a problem with alcohol or drug use, talk to your provider. This includes prescription medicines (such as amphetamines and opioids) and illegal drugs (such as cocaine and methamphetamine). Your provider can help you figure out what type of  treatment is best for you.   If you have concerns about domestic violence or intimate partner violence, there are resources available to you. National Domestic Abuse Hotline 563 109 1692   Protect your skin from too much sun. When you're outdoors from 10 a.m. to 4 p.m., stay in the shade or cover up with clothing and a hat with a wide brim. Wear sunglasses that block UV rays. Even when it's cloudy, put broad-spectrum sunscreen (SPF 30 or higher) on any exposed skin.   See a dentist one or two times a year for checkups and to have  your teeth cleaned.   See an eye doctor once per year for an eye exam.   Wear a seat belt in the car.  When should you call for help?  Watch closely for changes in your health, and be sure to contact your provider if you have any problems or symptoms that concern you.  We will plan to see you back in 1 year for your next physical  You will receive a survey after today's visit either digitally by e-mail or paper by USPS mail. Your experiences and feedback matter to [place].  Please respond so we know how we are doing as we provide care for you.  Call us with any questions/concerns/needs.  It is my goal to be available to you for your health concerns.  Thanks for choosing me to be a partner in your healthcare needs!  Charlaine Dalton, FNP-C Family Nurse Practitioner [facility] Phone: [phone]

## 2020-01-07 NOTE — Assessment & Plan Note (Signed)
Annual physical exam without new findings.  Well adult with no acute concerns.  Plan: 1. Obtain health maintenance screenings as above according to age. - Increase physical activity to 30 minutes most days of the week.  - Eat healthy diet high in vegetables and fruits; low in refined carbohydrates. - Screening labs and tests as ordered 2. Return 1 year for annual physical.  

## 2020-01-07 NOTE — Assessment & Plan Note (Signed)
PHQ9-11/GAD7-17.  Currently following with a therapist and has met with [facility] in the past for psychiatry.  Requesting referral to establish local with psychiatry and therapy.  No SI/HI.  Referral to psychiatry and contact information for [facility] provided as well.  Plan: 1. Continue medications as prescribed by [facility] 2. Referral placed to psychiatry and counseling 3. RTC in 3 months

## 2020-01-15 ENCOUNTER — Ambulatory Visit
Admission: RE | Admit: 2020-01-15 | Discharge: 2020-01-15 | Disposition: A | Discharge status: Home / Self Care | Payer: Medicare Other | Source: Ambulatory Visit | Attending: Emergency Medicine | Admitting: Emergency Medicine

## 2020-01-15 ENCOUNTER — Other Ambulatory Visit: Payer: Self-pay

## 2020-01-15 VITALS — BP 113/76 | HR 75 | Temp 98.4°F | Resp 16

## 2020-01-15 DIAGNOSIS — N764 Abscess of vulva: Secondary | ICD-10-CM | POA: Insufficient documentation

## 2020-01-15 MED ORDER — SULFAMETHOXAZOLE-TRIMETHOPRIM 800-160 MG PO TABS: 1.0000 | ORAL_TABLET | Freq: Two times a day (BID) | ORAL | 0 refills | Status: AC

## 2020-01-15 NOTE — ED Provider Notes (Signed)
Ellen Kaiser    CSN: [id number] Arrival date & time: 01/15/20  1305      History   Chief Complaint Chief Complaint  Patient presents with  . Abscess    HPI Ellen Kaiser is a 29 y.o. female.   Patient presents with a labial abscess x1.5 weeks.  No open wounds or drainage.  She denies fever, chills, abdominal pain, dysuria, vaginal discharge, pelvic pain, or other symptoms.  Treatment attempted at home with warm compresses.    The history is provided by the patient.    Past Medical History:  Diagnosis Date  . Allergy   . Anxiety   . Asthma   . Bipolar 1 disorder (HCC)   . BV (bacterial vaginosis)   . Depression   . HSV-2 seropositive     Patient Active Problem List   Diagnosis Date Noted  . Routine medical exam 01/07/2020  . Hidradenitis suppurativa 12/28/2019  . Neck strain, initial encounter 11/12/2019  . Back pain 11/12/2019  . Encounter to establish care with new doctor 10/08/2019  . Vaginitis 10/08/2019  . Anxiety 10/08/2019  . Asthma 10/08/2019  . GERD (gastroesophageal reflux disease) 10/08/2019  . Depression, major, single episode, mild (HCC) 10/08/2019  . 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 Bipolar 1 disorder (HCC) 04/02/2019  . Tobacco use disorder 04/02/2019  . Migraine without aura and without status migrainosus, not intractable 04/02/2019  . 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 Mild intermittent asthma without complication 03/21/2015  . Allergic rhinitis 03/21/2015    History reviewed. No pertinent surgical history.  OB History    Gravida  0   Para  0   Term  0   Preterm  0   AB  0   Living  0     SAB  0   TAB  0   Ectopic  0   Multiple  0   Live Births  0            Home Medications    Prior to Admission medications   Medication Sig Start Date End Date Taking? Authorizing Provider  ARIPiprazole (ABILIFY) 15 MG tablet Take by mouth. 05/29/19   [provider]  clonazePAM (KLONOPIN) 1 MG tablet Take 1  tablet by mouth 2 (two) times daily. 02/28/19   [provider]  cyclobenzaprine (FLEXERIL) 10 MG tablet Take 1 tablet (10 mg total) by mouth 3 (three) times daily as needed for muscle spasms. 12/28/19   Malfi, Jodelle Gross, FNP  doxycycline (VIBRA-TABS) 100 MG tablet Take 1 tablet (100 mg total) by mouth 2 (two) times daily. Patient not taking: Reported on 01/07/2020 12/28/19   Tarri Fuller, FNP  hydrOXYzine (ATARAX/VISTARIL) 50 MG tablet Take 100 mg by mouth daily.  03/30/19   [provider]  levonorgestrel (MIRENA) 20 MCG/24HR IUD 1 each by Intrauterine route once.    [provider]  naproxen (NAPROSYN) 500 MG tablet Take 1 tablet (500 mg total) by mouth 2 (two) times daily with a meal. 11/12/19   Malfi, Jodelle Gross, FNP  sulfamethoxazole-trimethoprim (BACTRIM DS) 800-160 MG tablet Take 1 tablet by mouth 2 (two) times daily for 7 days. 01/15/20 01/22/20  Mickie Bail, NP    Family History Family History  Problem Relation Age of Onset  . Depression Mother   . Alcohol abuse Mother   . Bipolar disorder Mother     Social History Social History   Tobacco Use  . Smoking status: Current Every Day Smoker  Packs/day: 0.50    Years: 10.00    Pack years: 5.00    Types: Cigarettes  . Smokeless tobacco: Never Used  Vaping Use  . Vaping Use: Some days  . Substances: THC  Substance Use Topics  . Alcohol use: Yes    Alcohol/week: 3.0 standard drinks    Types: 3 Cans of beer per week    Comment: 3 beers weekly  . Drug use: Yes    Types: Marijuana     Allergies   Fish allergy   Review of Systems Review of Systems  Constitutional: Negative for chills and fever.  HENT: Negative for ear pain and sore throat.   Eyes: Negative for pain and visual disturbance.  Respiratory: Negative for cough and shortness of breath.   Cardiovascular: Negative for chest pain and palpitations.  Gastrointestinal: Negative for abdominal pain and vomiting.  Genitourinary: Negative for  dysuria and hematuria.  Musculoskeletal: Negative for arthralgias and back pain.  Skin: Positive for wound. Negative for color change and rash.  Neurological: Negative for seizures and syncope.  All other systems reviewed and are negative.    Physical Exam Triage Vital Signs ED Triage Vitals  Enc Vitals Group     BP 01/15/20 1316 113/76     Pulse Rate 01/15/20 1316 75     Resp 01/15/20 1316 16     Temp 01/15/20 1316 98.4 F (36.9 C)     Temp src --      SpO2 01/15/20 1316 95 %     Weight --      Height --      Head Circumference --      Peak Flow --      Pain Score 01/15/20 1315 7     Pain Loc --      Pain Edu? --      Excl. in GC? --    No data found.  Updated Vital Signs BP 113/76   Pulse 75   Temp 98.4 F (36.9 C)   Resp 16   SpO2 95%   Visual Acuity Right Eye Distance:   Left Eye Distance:   Bilateral Distance:    Right Eye Near:   Left Eye Near:    Bilateral Near:     Physical Exam Vitals and nursing note reviewed.  Constitutional:      General: She is not in acute distress.    Appearance: She is well-developed. She is not ill-appearing.  HENT:     Head: Normocephalic and atraumatic.     Mouth/Throat:     Mouth: Mucous membranes are moist.  Eyes:     Conjunctiva/sclera: Conjunctivae normal.  Cardiovascular:     Rate and Rhythm: Normal rate and regular rhythm.     Heart sounds: No murmur heard.   Pulmonary:     Effort: Pulmonary effort is normal. No respiratory distress.     Breath sounds: Normal breath sounds.  Abdominal:     Palpations: Abdomen is soft.     Tenderness: There is no abdominal tenderness. There is no right CVA tenderness, left CVA tenderness, guarding or rebound.  Genitourinary:    Vagina: No vaginal discharge.     Comments: 1 cm firm nonfluctuant tender lesion on right labia; no open wounds or drainage.  Needle aspiration with scant blood return only.   Musculoskeletal:     Cervical back: Neck supple.  Skin:    General:  Skin is warm and dry.  Neurological:     General: No focal  deficit present.     Mental Status: She is alert and oriented to person, place, and time.     Gait: Gait normal.  Psychiatric:        Mood and Affect: Mood normal.        Behavior: Behavior normal.      UC Treatments / Results  Labs (all labs ordered are listed, but only abnormal results are displayed) Labs Reviewed  HSV CULTURE AND TYPING    EKG   Radiology No results found.  Procedures Procedures (including critical care time)  Medications Ordered in UC Medications - No data to display  Initial Impression / Assessment and Plan / UC Course  I have reviewed the triage vital signs and the nursing notes.  Pertinent labs & imaging results that were available during my care of the patient were reviewed by me and considered in my medical decision making (see chart for details).   Labial abscess.  Not ready for I&D.  Treating with Septra DS.  HSV swab obtained per patient request; she states she was previously diagnosed with HSV but then her doctor told her that may not have been correct.  Discussed with patient that she is outside the window of time for HSV treatment.  Instructed her to abstain from sexual activity until the test result is back.  Instructed her to follow-up with her PCP if her symptoms are not improving.  Patient agrees to plan of care.   Final Clinical Impressions(s) / UC Diagnoses   Final diagnoses:  Labial abscess     Discharge Instructions     Take the antibiotic as directed.   Follow up with your primary care provider if your symptoms are not improving.       ED Prescriptions    Medication Sig Dispense Auth. Provider   sulfamethoxazole-trimethoprim (BACTRIM DS) 800-160 MG tablet Take 1 tablet by mouth 2 (two) times daily for 7 days. 14 tablet Mickie Bail, NP     PDMP not reviewed this encounter.   Mickie Bail, NP 01/15/20 1350

## 2020-01-15 NOTE — Discharge Instructions (Signed)
Take the antibiotic as directed.  Follow up with your primary care provider if your symptoms are not improving.     

## 2020-01-15 NOTE — ED Triage Notes (Signed)
Patient reports abscess x1.5 weeks.

## 2020-01-17 LAB — HSV CULTURE AND TYPING

## 2020-02-04 ENCOUNTER — Ambulatory Visit (INDEPENDENT_AMBULATORY_CARE_PROVIDER_SITE_OTHER): Payer: Medicare Other | Admitting: Licensed Clinical Social Worker

## 2020-02-04 ENCOUNTER — Encounter: Payer: Self-pay | Admitting: Licensed Clinical Social Worker

## 2020-02-04 ENCOUNTER — Other Ambulatory Visit: Payer: Self-pay

## 2020-02-04 DIAGNOSIS — F319 Bipolar disorder, unspecified: Secondary | ICD-10-CM | POA: Diagnosis not present

## 2020-02-04 DIAGNOSIS — F1210 Cannabis abuse, uncomplicated: Secondary | ICD-10-CM
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
## 2020-02-22 ENCOUNTER — Ambulatory Visit (INDEPENDENT_AMBULATORY_CARE_PROVIDER_SITE_OTHER): Payer: Medicare Other | Admitting: Licensed Clinical Social Worker

## 2020-02-22 ENCOUNTER — Other Ambulatory Visit: Payer: Self-pay

## 2020-02-22 ENCOUNTER — Encounter: Payer: Self-pay | Admitting: Licensed Clinical Social Worker

## 2020-02-22 DIAGNOSIS — F319 Bipolar disorder, unspecified: Secondary | ICD-10-CM | POA: Diagnosis not present

## 2020-02-22 NOTE — Progress Notes (Signed)
Patient Location: Home  Provider Location: Home Office   Virtual Visit via Video Note  I connected with Ellen Kaiser on 02/22/20 at 10:00 AM EDT by a video enabled telemedicine application and verified that I am speaking with the correct person using two identifiers.   I discussed the limitations of evaluation and management by telemedicine and the availability of in person appointments. The patient expressed understanding and agreed to proceed.  THERAPY PROGRESS NOTE  Session Time: 60 Minutes  Participation Level: Active  Behavioral Response: Casual and Well GroomedAlertAnxious  Type of Therapy: Individual Therapy  Treatment Goals addressed: Anger, Anxiety and Coping  Interventions: CBT and DBT  Summary: Ellen Kaiser is a 29 y.o. female who presents with dx of Bipolar disorder and sxs of anxiety. Pt reported she feels like she is in survival mode and struggling to see how she is going to get to the life she wants in her current predicament. Pt identified current stressors and ways she has tried to cope.   Suicidal/Homicidal: No  Therapist Response: Therapist met with patient for first session since CCA. Therapist and patient reviewed treatment plan and goals. Pt in agreement. Therapist and patient discussed identification of stressors, processed thoughts and feelings and coping strategies. Therapist provided psychoeducation around DBT and CBT. Therapist encouraged patient to utilize grounding techniques and assigned patient homework to practice these techniques discussed between now and next session. Pt was receptive.  Plan: Return again in 2 weeks.  Diagnosis: Axis I: Bipolar, mixed    Axis II: N/A  Lauren P O'Reilly, LCSW, LCAS 02/22/2020     

## 2020-03-05 ENCOUNTER — Ambulatory Visit (INDEPENDENT_AMBULATORY_CARE_PROVIDER_SITE_OTHER): Payer: Medicare Other | Admitting: Dermatology

## 2020-03-05 ENCOUNTER — Encounter: Payer: Self-pay | Admitting: Dermatology

## 2020-03-05 ENCOUNTER — Other Ambulatory Visit: Payer: Self-pay

## 2020-03-05 DIAGNOSIS — L731 Pseudofolliculitis barbae: Secondary | ICD-10-CM | POA: Diagnosis not present

## 2020-03-05 DIAGNOSIS — L700 Acne vulgaris: Secondary | ICD-10-CM

## 2020-03-05 MED ORDER — MOMETASONE FUROATE 0.1 % EX CREA: 1.0000 "application " | TOPICAL_CREAM | Freq: Every day | CUTANEOUS | 11 refills | Status: DC | PRN

## 2020-03-05 MED ORDER — DOXYCYCLINE HYCLATE 100 MG PO TABS: 100.0000 mg | ORAL_TABLET | Freq: Every day | ORAL | 0 refills | Status: AC

## 2020-03-05 MED ORDER — ADAPALENE 0.1 % EX CREA: TOPICAL_CREAM | Freq: Every day | CUTANEOUS | 3 refills | Status: DC

## 2020-03-05 NOTE — Patient Instructions (Addendum)
Recommend over the counter Gillette skin guard  '  .

## 2020-03-05 NOTE — Progress Notes (Signed)
    New Patient    Subjective  Ellen Kaiser is a 29 y.o. female who presents for the following: Skin Problem (Pt c/o ingrown hairs in the groin area, past treatment BPO, Clindamycin solution ). Also has acne she wants treated.   The following portions of the chart were reviewed this encounter and updated as appropriate:  Tobacco  Allergies  Meds  Problems  Med Hx  Surg Hx  Fam Hx     Review of Systems:  No other skin or systemic complaints except as noted in HPI or Assessment and Plan.  Objective  Well appearing patient in no apparent distress; mood and affect are within normal limits.  A focused examination was performed including groin . Relevant physical exam findings are noted in the Assessment and Plan.  Objective  Right Suprapubic Area: Follicular based papules   Objective  Head - Anterior (Face): Scarring and active papules    Assessment & Plan  Pseudofolliculitis barbae pubic Area Start Doxycycline 100 mg take 1 tablet daily with food  Start Mometasone cream apply to skin qd-bid  Start Gillette skin guard razor No waxing, no close shaving   Ordered Medications: mometasone (ELOCON) 0.1 % cream doxycycline (VIBRA-TABS) 100 MG tablet  Acne vulgaris Head - Anterior (Face)  Start Doxycycline 100 mg take 1 tablet daily with food Start Adapalene gel apply to face qhs   Ordered Medications: doxycycline (VIBRA-TABS) 100 MG tablet adapalene (DIFFERIN) 0.1 % cream  Return in about 4 months (around 07/05/2020).  IAngelique Holm, CMA, am acting as scribe for Armida Sans, MD .  Documentation: I have reviewed the above documentation for accuracy and completeness, and I agree with the above.  Armida Sans, MD

## 2020-03-06 ENCOUNTER — Encounter: Payer: Self-pay | Admitting: Licensed Clinical Social Worker

## 2020-03-06 ENCOUNTER — Ambulatory Visit (INDEPENDENT_AMBULATORY_CARE_PROVIDER_SITE_OTHER): Payer: Medicare Other | Admitting: Licensed Clinical Social Worker

## 2020-03-06 DIAGNOSIS — F319 Bipolar disorder, unspecified: Secondary | ICD-10-CM | POA: Diagnosis not present

## 2020-03-06 NOTE — Progress Notes (Signed)
Patient Location: Home  Provider Location: Home Office   Virtual Visit via Video Note  I connected with [name] on 03/06/20 at  1:00 PM EDT by a video enabled telemedicine application and verified that I am speaking with the correct person using two identifiers.   I discussed the limitations of evaluation and management by telemedicine and the availability of in person appointments. The patient expressed understanding and agreed to proceed.  THERAPY PROGRESS NOTE  Session Time: 34 Minutes  Participation Level: Active  Behavioral Response: CasualAlertDysphoric  Type of Therapy: Individual Therapy  Treatment Goals addressed: Coping  Interventions: CBT and Motivational Interviewing  Summary: Ellen Kaiser is a 29 y.o. female who presents with dx of Bipolar disorder. Pt reported that not a lot has changed since last session and reviewed material on CBT and DBT. Pt identified values including: Comfort, Acceptance, Knowledge, Flexibility, Risk. Pt then created goals for living out those values and specific objectives with deadlines including:   Apply for 1-3 jobs by next week Notice and say aloud something positive about a new person/entity I meet by next week Read at least 1 self-help book in 30-60 days Do 2 things a day that make me uncomfortable/undesirable, but necessary Go to a concert/fun social gathering in the next 3 months  Suicidal/Homicidal: No  Therapist Response: Therapist met with patient for follow up session. Therapist and patient reviewed homework assignment. Therapist and patient explored values and goals. Therapist engaged patient in value sort card activity in which she selected meaningful values and narrowed down her top 5 to create SMART goals around. Pt was very receptive.  Plan: Return again in 2 weeks.  Diagnosis: Axis I: Bipolar, mixed    Axis II: N/A   [name], LCSW, LCAS 03/06/2020

## 2020-03-17 ENCOUNTER — Telehealth: Payer: Self-pay

## 2020-03-17 ENCOUNTER — Telehealth (INDEPENDENT_AMBULATORY_CARE_PROVIDER_SITE_OTHER): Payer: Medicare Other | Admitting: Psychiatry

## 2020-03-17 ENCOUNTER — Other Ambulatory Visit: Payer: Self-pay

## 2020-03-17 DIAGNOSIS — F39 Unspecified mood [affective] disorder: Secondary | ICD-10-CM

## 2020-03-17 NOTE — Telephone Encounter (Signed)
Medication management - Telephone call with pt to follow up on canceled appt today with Dr. Elna Breslow due to patient admitting she was out of state.  Attempted to reschedule and explained Dr.Eappen could not see her if not in [place] due to license to practice for [employer].  Patient was not happy with this response and argued because it was a virtual visit she should be able to be seen anywhere and should have been told this first.  Informed patient Dr. Elna Breslow would be glad to see her but that she has to question where you are when she does virtual visits to be compliant with her licensure.  Patient sated" I do not want to come back and see that..........".  Cursing often and stated she would want to see someone different than Dr. Elna Breslow.  Offered to provide numbers to our other practices in [place], [place] and [place] but patient refused and stated she would contact her PCP for another referral some where different as she did not want to see any provider options this nurse could share. Patient will follow up with her primary provider for care and medications per her report today.

## 2020-03-17 NOTE — Progress Notes (Signed)
Patient ID: Ellen Kaiser, female   DOB: 11-Mar-1991, 29 y.o.   MRN: [id number]   At the time of evaluation today writer contacted the patient through my chart video.  When patient was advised to identify her location she reported she was out of state and not currently present in [place].  Writer advised patient that this evaluation cannot be completed since she is not physically present in [place] and she will have to reschedule.  Patient at that time became very upset, angry and agitated.  She asked why does not matter what her location was since this is already a virtual appointment.  Writer attempted to discuss with her and tried to educate her.  Patient however continued to be agitated and reported that she has been out of her meds since the past 1-1/2 weeks and wanted writer to give her medications today.  Writer discussed with her that medications cannot be refilled without an evaluation since he is a new patient for [facility].  Advised patient to contact her primary care provider to cover her medications until she can establish care.  Patient at this time became angry and hung up the phone before writer could discuss further.  Communicated the same with staff here Clint Lipps and Ines Bloomer RN.  Discussed to offer patient a date to reschedule this appointment.

## 2020-03-17 NOTE — Telephone Encounter (Signed)
Thanks Shawn 

## 2020-03-21 ENCOUNTER — Other Ambulatory Visit: Payer: Self-pay

## 2020-03-21 ENCOUNTER — Ambulatory Visit (INDEPENDENT_AMBULATORY_CARE_PROVIDER_SITE_OTHER): Payer: Medicare Other | Admitting: Licensed Clinical Social Worker

## 2020-03-21 ENCOUNTER — Encounter: Payer: Self-pay | Admitting: Licensed Clinical Social Worker

## 2020-03-21 DIAGNOSIS — F319 Bipolar disorder, unspecified: Secondary | ICD-10-CM | POA: Diagnosis not present

## 2020-03-21 NOTE — Progress Notes (Signed)
Patient Location: Home  Provider Location: Home Office   Virtual Visit via Video Note  I connected with Ellen Kaiser on 03/21/20 at  9:00 AM EDT by a video enabled telemedicine application and verified that I am speaking with the correct person using two identifiers.   I discussed the limitations of evaluation and management by telemedicine and the availability of in person appointments. The patient expressed understanding and agreed to proceed.  THERAPY PROGRESS NOTE  Session Time: 15 Minutes  Participation Level: Active  Behavioral Response: Casual and Well GroomedAlertAngry and Irritable  Type of Therapy: Individual Therapy  Treatment Goals addressed: Anger and Coping  Interventions: CBT  Summary: Ellen Kaiser is a 29 y.o. female who presents with dx of Bipolar Disorder. Pt reported she was still angry over not being able to see the psychiatrist earlier this week and having to wait longer to obtain medication. Pt reported she understands the limitations of telehealth after therapist explained rationale, however is not happy with the process and feeling low on patience. Pt reported she needed a referral for another psychiatric provider outside the practice. Pt reported she understood options provided and intends to follow through. Pt reported she was not up for discussing coping skills at this time and asked to see therapist again next week so she can collect her thoughts.   Suicidal/Homicidal: No  Therapist Response: Therapist met with patient for follow up session. Therapist and patient discussed limitations of telehealth and process for referral to another psychiatrist. Therapist encouraged patient to contact her primary care physician for a new referral and to see if she would be willing to prescribe her the medication she needs in the mean time. Therapist also informed patient that if she felt her sxs were worsening and needed immediate care she could utilize ER.  Pt was receptive. Therapist attempted to review coping skills with patient to address sxs, however patient was not receptive at this time and agreed to meet again next week.  Plan: Return again on 03/24/20 at 1pm.  Diagnosis: Axis I: Bipolar, mixed    Axis II: N/A  [name], LCSW, LCAS 03/21/2020

## 2020-03-24 ENCOUNTER — Ambulatory Visit: Payer: Medicare Other | Admitting: Licensed Clinical Social Worker

## 2020-03-24 ENCOUNTER — Telehealth: Payer: Self-pay | Admitting: Licensed Clinical Social Worker

## 2020-03-24 ENCOUNTER — Other Ambulatory Visit: Payer: Self-pay

## 2020-03-24 NOTE — Telephone Encounter (Signed)
Therapist was late to session due to connection issues requiring a complete reboot of computer to connect with patient on Epic. Pt waited for therapist in virtual lobby and left by the time therapist was able to reconnect and reach out for the 1pm appointment scheduled for today. Therapist was able to reach patient via text video session invite about 20 minutes after. Pt was already driving when she responded to invite and was offered to continue session via hands free audio or reschedule for another time. Pt was understanding and preferred to reschedule. Therapist apologized for tardiness and agreed to reschedule appointment for video session next week.

## 2020-03-31 ENCOUNTER — Encounter: Payer: Self-pay | Admitting: Licensed Clinical Social Worker

## 2020-03-31 ENCOUNTER — Ambulatory Visit (INDEPENDENT_AMBULATORY_CARE_PROVIDER_SITE_OTHER): Payer: Medicare Other | Admitting: Licensed Clinical Social Worker

## 2020-03-31 ENCOUNTER — Other Ambulatory Visit: Payer: Self-pay

## 2020-03-31 DIAGNOSIS — F319 Bipolar disorder, unspecified: Secondary | ICD-10-CM | POA: Diagnosis not present

## 2020-03-31 NOTE — Progress Notes (Signed)
Patient Location: [employer], commercial Location: Home Office   Virtual Visit via Video Note  I connected with Ellen Kaiser on 03/31/20 at  9:00 AM EDT by a video enabled telemedicine application and verified that I am speaking with the correct person using two identifiers.   I discussed the limitations of evaluation and management by telemedicine and the availability of in person appointments. The patient expressed understanding and agreed to proceed.  THERAPY PROGRESS NOTE  Session Time: 25 Minutes  Participation Level: Active  Behavioral Response: Well GroomedAlertDepressed and Irritable  Type of Therapy: Individual Therapy  Treatment Goals addressed: Anger, Communication: Setting Boundaries and Coping  Interventions: CBT and DBT  Summary: Ellen Kaiser is a 29 y.o. female who presents with Bipolar dx. Pt reported she is feeling "okay" and is currently looking for other housing opportunities after having an argument with current roommate. Pt reported she is in a supportive intimate relationship and they are pursuing moving in together. Pt is a bit hesitant however acknowledging that she likes her own space and needs to have a place that she can go to decompress from being around others. Pt reported she would like to focus on skills to "keep it together" and acknowledged her anger and reactions towards stressors as problematic. Pt was receptive to discussion around distress tolerance and communication skills.    Suicidal/Homicidal: No  Therapist Response: Therapist met with patient for follow up session. Therapist and patient reviewed patient's progression towards goals from previous session. Therapist provided psychoeducation around [employer] and communicating boundaries. Pt was very receptive.  Plan: Return again in 1 week.  Diagnosis: Axis I: Bipolar, mixed    Axis II: N/A  [name], LCSW, LCAS 03/31/2020

## 2020-04-02 ENCOUNTER — Encounter: Payer: Medicare Other | Admitting: Internal Medicine

## 2020-04-09 ENCOUNTER — Other Ambulatory Visit: Payer: Self-pay | Admitting: Dermatology

## 2020-04-09 DIAGNOSIS — L732 Hidradenitis suppurativa: Secondary | ICD-10-CM

## 2020-04-10 ENCOUNTER — Other Ambulatory Visit: Payer: Self-pay

## 2020-04-10 ENCOUNTER — Ambulatory Visit (INDEPENDENT_AMBULATORY_CARE_PROVIDER_SITE_OTHER): Payer: Medicare Other | Admitting: Family Medicine

## 2020-04-10 ENCOUNTER — Encounter: Payer: Self-pay | Admitting: Family Medicine

## 2020-04-10 VITALS — BP 130/72 | HR 85 | Resp 18 | Ht 60.0 in | Wt 156.8 lb

## 2020-04-10 DIAGNOSIS — R252 Cramp and spasm: Secondary | ICD-10-CM | POA: Insufficient documentation

## 2020-04-10 DIAGNOSIS — F319 Bipolar disorder, unspecified: Secondary | ICD-10-CM | POA: Diagnosis not present

## 2020-04-10 DIAGNOSIS — T148XXA Other injury of unspecified body region, initial encounter: Secondary | ICD-10-CM | POA: Insufficient documentation

## 2020-04-10 MED ORDER — CYCLOBENZAPRINE HCL 10 MG PO TABS: 10.0000 mg | ORAL_TABLET | Freq: Three times a day (TID) | ORAL | 0 refills | Status: DC | PRN

## 2020-04-10 NOTE — Assessment & Plan Note (Signed)
Right hand with cramping/spasm near abductor and flexor pollicis.  Likely secondary to overuse.  Is currently in cosmetology school and doing online classes.  Encouraged to rest, use ibuprofen, can take cyclobenzaprine 10mg  TID PRN for muscle spasm/cramping and light massage to help the area of the hand relax.  Plan: 1. Supportive care with massage, rest, ibuprofen 2. Can take cyclobenzaprine 10mg  TID PRN for cramp/spasm 3. RTC PRN

## 2020-04-10 NOTE — Patient Instructions (Addendum)
I have placed a referral for psychiatry.  The practice we had spoken about that has providers on staff that complete genetic testing to find out which medications may be the most helpful is [facility].  They have locations all over [place].  Their [facility] office is [phone] and they do offer telemedicine appointments.  If [facility] is not the right fit for you, there are these other providers listed below.  Please contact one of these locations below to schedule an appointment for psychiatric care  [facility] [address] [place], [place] [address] Phone# [phone]  [facility] [address] [place], [place] [address] Phone# [phone]  [facility] [address] [place], [place] [address] Phone# [phone]  [facility] [address] [place], [place] [address] Phone# [phone]  I have sent in a prescription for cyclobenzaprine 10mg  to take 1 tablet up to 3x per day as needed for right hand spasm.  Can work towards light massage of the right hand to help alleviate the cramp and spasm.  We will plan to see you back if your symptoms worsen or fail to improve  You will receive a survey after today's visit either digitally by e-mail or paper by USPS mail. Your experiences and feedback matter to .  Please respond so we know how we are doing as we provide care for you.  Call us with any questions/concerns/needs.  It is my goal to be available to you for your health concerns.  Thanks for choosing me to be a partner in your healthcare needs!  [place], FNP-C Family Nurse Practitioner [facility] Phone: [phone]

## 2020-04-10 NOTE — Assessment & Plan Note (Signed)
Psychiatric provider contact information resources provided to patient to contact to establish care.

## 2020-04-10 NOTE — Progress Notes (Signed)
Subjective:    Patient ID: Ellen Kaiser, female    DOB: 1990-07-06, 29 y.o.   MRN: [id number]  Ellen Kaiser is a 29 y.o. female presenting on 04/10/2020 for Wrist Pain (Rt hand and wrist pain x lastnight. She complains that the pain worsen with movement or grasping things.  Pt doesn't recall any injury to the hand. ) and Manic Behavior (requesting a new referral to psychiatrist)   HPI  [name] presents to clinic with concerns of right hand and wrist pain x 1 day, reports pain worsens with movement and grasping items.  Denies any trauma, fall, injury or accident with the hand/arm.  Has some numbness/tingling in the thumb and pinky, full strength and ROM.  Has taken ibuprofen and used topical tiger balm without improvement in symptoms.  Depression screen [facility] 2/9 01/07/2020 10/08/2019 05/24/2019  Decreased Interest 2 2 1   Down, Depressed, Hopeless 2 1 1   PHQ - 2 Score 4 3 2   Altered sleeping 1 1 0  Tired, decreased energy 1 1 0  Change in appetite 2 2 2   Feeling bad or failure about yourself  0 0 0  Trouble concentrating 1 2 3   Moving slowly or fidgety/restless 1 3 0  Suicidal thoughts 1 0 0  PHQ-9 Score 11 12 7   Difficult doing work/chores Not difficult at all Very difficult Somewhat difficult    Social History   Tobacco Use  . Smoking status: Current Every Day Smoker    Packs/day: 0.50    Years: 10.00    Pack years: 5.00    Types: Cigarettes  . Smokeless tobacco: Never Used  Vaping Use  . Vaping Use: Former  . Substances: THC  Substance Use Topics  . Alcohol use: Yes    Alcohol/week: 3.0 standard drinks    Types: 3 Cans of beer per week    Comment: 3 beers weekly  . Drug use: Yes    Types: Marijuana    Review of Systems  Constitutional: Negative.   HENT: Negative.   Eyes: Negative.   Respiratory: Negative.   Cardiovascular: Negative.   Gastrointestinal: Negative.   Endocrine: Negative.   Genitourinary: Negative.   Musculoskeletal: Positive  for myalgias. Negative for arthralgias, back pain, gait problem, joint swelling, neck pain and neck stiffness.  Skin: Negative.   Allergic/Immunologic: Negative.   Neurological: Negative.   Hematological: Negative.   Psychiatric/Behavioral: Negative.    Per HPI unless specifically indicated above     Objective:    BP 130/72   Pulse 85   Resp 18   Ht 5' (1.524 m)   Wt 156 lb 12.8 oz (71.1 kg)   SpO2 100%   BMI 30.62 kg/m   Wt Readings from Last 3 Encounters:  04/10/20 156 lb 12.8 oz (71.1 kg)  01/07/20 154 lb 12.8 oz (70.2 kg)  12/28/19 149 lb 9.6 oz (67.9 kg)    Physical Exam Vitals and nursing note reviewed.  Constitutional:      General: She is not in acute distress.    Appearance: Normal appearance. She is well-developed and well-groomed. She is obese. She is not ill-appearing or toxic-appearing.  HENT:     Head: Normocephalic and atraumatic.     Nose:     Comments: is in place, covering mouth and nose. Eyes:     General: Lids are normal. Vision grossly intact.        Right eye: No discharge.        Left eye:  No discharge.     Extraocular Movements: Extraocular movements intact.     Conjunctiva/sclera: Conjunctivae normal.     Pupils: Pupils are equal, round, and reactive to light.  Cardiovascular:     Pulses: Normal pulses.          Dorsalis pedis pulses are 2+ on the right side and 2+ on the left side.  Pulmonary:     Effort: Pulmonary effort is normal. No respiratory distress.  Musculoskeletal:        General: Tenderness present.     Right wrist: Normal.     Left wrist: Normal.     Right hand: Tenderness present. No swelling, deformity, lacerations or bony tenderness. Normal range of motion. Normal strength. Normal sensation. Normal capillary refill. Normal pulse.     Left hand: Normal.       Arms:     Right lower leg: No edema.     Left lower leg: No edema.  Skin:    General: Skin is warm and dry.     Capillary Refill: Capillary refill takes  less than 2 seconds.  Neurological:     General: No focal deficit present.     Mental Status: She is alert and oriented to person, place, and time.  Psychiatric:        Attention and Perception: Attention and perception normal.        Mood and Affect: Mood and affect normal.        Speech: Speech normal.        Behavior: Behavior normal. Behavior is cooperative.        Thought Content: Thought content normal.        Cognition and Memory: Cognition and memory normal.        Judgment: Judgment normal.    Results for orders placed or performed during the hospital encounter of 01/15/20  Hsv Culture And Typing   Specimen: Vulva; Other  Result Value Ref Range   HSV Culture/Type Comment    Source of Sample VULVA       Assessment & Plan:   Problem List Items Addressed This Visit      Other   Bipolar 1 disorder [facility])    Psychiatric provider contact information resources provided to patient to contact to establish care.      Cramp and spasm - Primary    Right hand with cramping/spasm near abductor and flexor pollicis.  Likely secondary to overuse.  Is currently in cosmetology school and doing online classes.  Encouraged to rest, use ibuprofen, can take cyclobenzaprine 10mg  TID PRN for muscle spasm/cramping and light massage to help the area of the hand relax.  Plan: 1. Supportive care with massage, rest, ibuprofen 2. Can take cyclobenzaprine 10mg  TID PRN for cramp/spasm 3. RTC PRN      Relevant Medications   cyclobenzaprine (FLEXERIL) 10 MG tablet      Meds ordered this encounter  Medications  . cyclobenzaprine (FLEXERIL) 10 MG tablet    Sig: Take 1 tablet (10 mg total) by mouth 3 (three) times daily as needed for muscle spasms.    Dispense:  30 tablet    Refill:  0    Follow up plan: Return if symptoms worsen or fail to improve.   , FNP Family Nurse Practitioner [facility] 04/10/2020, 10:21 AM

## 2020-04-11 ENCOUNTER — Ambulatory Visit (INDEPENDENT_AMBULATORY_CARE_PROVIDER_SITE_OTHER): Payer: Medicare Other | Admitting: Licensed Clinical Social Worker

## 2020-04-11 ENCOUNTER — Encounter: Payer: Self-pay | Admitting: Licensed Clinical Social Worker

## 2020-04-11 DIAGNOSIS — F319 Bipolar disorder, unspecified: Secondary | ICD-10-CM

## 2020-04-11 NOTE — Progress Notes (Signed)
Virtual Visit via Video Note  I connected with Ellen Kaiser on 04/11/20 at  9:00 AM EDT by a video enabled telemedicine application and verified that I am speaking with the correct person using two identifiers.  Location: Patient: Home Provider: Home Office   I discussed the limitations of evaluation and management by telemedicine and the availability of in person appointments. The patient expressed understanding and agreed to proceed.  THERAPY PROGRESS NOTE  Session Time: 9 Minutes  Participation Level: Active  Behavioral Response: Casual and Well GroomedAlertDepressed and Irritable  Type of Therapy: Individual Therapy  Treatment Goals addressed: Anger and Coping  Interventions: DBT  Summary: Ellen Kaiser is a 29 y.o. female who presents with Bipolar dx. Pt reported she acknowledges having difficulty implementing skills discussed so far due to lack of motivation and all-or-none thinking. Pt reported that she often gets caught up in her thoughts and negative emotions that impacts her decision making. Pt made commitment to practice meditating for a least 2 minutes per day to incorporate mindfulness.  Suicidal/Homicidal: No  Therapist Response: Therapist met with patient for follow up session. Therapist and patient reviewed patient's progression towards goals from previous session. Therapist provided psychoeducation on emotion regulation skill of Self-Care: PLEASED. Therapist engaged patient in discussing how to implement DBT skills learned so far through developing mini-habits. Pt was receptive.  Plan: Return again in 2 weeks.  Diagnosis: Axis I: Bipolar, mixed    Axis II: N/A  [name], LCSW, LCAS 04/11/2020

## 2020-04-25 ENCOUNTER — Ambulatory Visit (INDEPENDENT_AMBULATORY_CARE_PROVIDER_SITE_OTHER): Payer: Medicare Other | Admitting: Licensed Clinical Social Worker

## 2020-04-25 ENCOUNTER — Other Ambulatory Visit: Payer: Self-pay

## 2020-04-25 ENCOUNTER — Encounter: Payer: Self-pay | Admitting: Licensed Clinical Social Worker

## 2020-04-25 DIAGNOSIS — F319 Bipolar disorder, unspecified: Secondary | ICD-10-CM

## 2020-04-25 NOTE — Progress Notes (Signed)
Virtual Visit via Telephone Note  I connected with Elienai Gailey Servello on 04/25/20 at  9:00 AM EDT by telephone and verified that I am speaking with the correct person using two identifiers.  Location: Patient: Home Provider: Home Office   I discussed the limitations, risks, security and privacy concerns of performing an evaluation and management service by telephone and the availability of in person appointments. I also discussed with the patient that there may be a patient responsible charge related to this service. The patient expressed understanding and agreed to proceed.  THERAPY PROGRESS NOTE  Session Time: 15 Minutes  Participation Level: Active  Behavioral Response: AlertAngry and Irritable  Type of Therapy: Individual Therapy  Treatment Goals addressed: Anger and Communication: Expressing Needs and Concerns  Interventions: Supportive  Summary: Lorina Duffner Salahuddin is a 29 y.o. female who presents with Bipolar dx. Pt reported she wanted to keep this session brief and explained that she is feeling frustrated with this therapist for not providing a letter for her emotional support animal. Pt reported "I don't see why you won't help me with this when other therapists have signed off on it so she could continue to live with me. I don't want to have to get rid of my dog when I move". 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 Pt reported "I don't see how coping skills are going to help me right now when I am still in survival mode". Pt reported she understands therapist reasoning, but is not in the mood to continue session and would like to follow up again in 2 weeks. Pt reported if she is able to obtain stable housing she would like to continue therapy, however "this may not work out or be a priority for me right  now".  Suicidal/Homicidal: No  Therapist Response: Therapist met with patient for follow up session. Therapist and patient discussed requests for letter to provide for ESA and FMLA. Therapist acknowledged patient frustration around not being able to provide ESA letter. Therapist explained process for FMLA and that paperwork would need to come from patient's school in order for therapist to fill out portions that speak to patient's mental health. Therapist thanked patient for her honesty and communicating assertively while understandably frustrated about therapist limitations to provide patient with documentation she is requesting. Pt agreed to follow up in 2 weeks.  Plan: Return again in 2 weeks.  Diagnosis: Axis I: Bipolar, mixed    Axis II: N/A  [name], LCSW, LCAS 04/25/2020

## 2020-04-28 ENCOUNTER — Ambulatory Visit: Payer: Medicare Other

## 2020-05-09 ENCOUNTER — Ambulatory Visit (INDEPENDENT_AMBULATORY_CARE_PROVIDER_SITE_OTHER): Payer: Medicare Other | Admitting: Licensed Clinical Social Worker

## 2020-05-09 ENCOUNTER — Encounter: Payer: Self-pay | Admitting: Licensed Clinical Social Worker

## 2020-05-09 ENCOUNTER — Other Ambulatory Visit: Payer: Self-pay

## 2020-05-09 DIAGNOSIS — F319 Bipolar disorder, unspecified: Secondary | ICD-10-CM

## 2020-05-09 NOTE — Progress Notes (Signed)
Virtual Visit via Telephone Note  I connected with Ellen Kaiser on 05/09/20 at  9:00 AM EST by telephone and verified that I am speaking with the correct person using two identifiers.  Location: Patient: Home Provider: Home Office   I attempted to connect with patient using video invite links via text and email, however due to connection issues, session was moved to the phone. I discussed the limitations, risks, security and privacy concerns of performing an evaluation and management service by telephone and the availability of in person appointments. I also discussed with the patient that there may be a patient responsible charge related to this service. The patient expressed understanding and agreed to proceed.  Session Length: 5 Minutes  Therapist was able to reach patient for today's appointment via phone. Pt reported feeling "okay", but her "patience is at an all time low and I don't feel like talking today". Pt would like to hold off on therapy sessions until she is done with this semester's classes. Pt requested therapist reach out 05/30/20 to reschedule next session.

## 2020-05-12 DIAGNOSIS — J45909 Unspecified asthma, uncomplicated: Secondary | ICD-10-CM | POA: Diagnosis not present

## 2020-05-12 DIAGNOSIS — G4700 Insomnia, unspecified: Secondary | ICD-10-CM | POA: Diagnosis not present

## 2020-05-26 ENCOUNTER — Other Ambulatory Visit: Payer: Self-pay

## 2020-05-26 ENCOUNTER — Ambulatory Visit (INDEPENDENT_AMBULATORY_CARE_PROVIDER_SITE_OTHER): Payer: Medicare Other | Admitting: Family Medicine

## 2020-05-26 ENCOUNTER — Other Ambulatory Visit (HOSPITAL_COMMUNITY)
Admission: RE | Admit: 2020-05-26 | Discharge: 2020-05-26 | Disposition: A | Discharge status: Home / Self Care | Payer: Medicare Other | Source: Ambulatory Visit | Attending: Family Medicine | Admitting: Family Medicine

## 2020-05-26 ENCOUNTER — Encounter: Payer: Self-pay | Admitting: Family Medicine

## 2020-05-26 VITALS — BP 118/90 | HR 81 | Resp 18 | Ht 61.0 in | Wt 161.2 lb

## 2020-05-26 DIAGNOSIS — Z23 Encounter for immunization: Secondary | ICD-10-CM | POA: Insufficient documentation

## 2020-05-26 DIAGNOSIS — N760 Acute vaginitis: Secondary | ICD-10-CM | POA: Diagnosis present

## 2020-05-26 MED ORDER — CLINDAMYCIN HCL 300 MG PO CAPS: 300.0000 mg | ORAL_CAPSULE | Freq: Two times a day (BID) | ORAL | 0 refills | Status: AC

## 2020-05-26 NOTE — Assessment & Plan Note (Signed)
See vaginitis AP

## 2020-05-26 NOTE — Progress Notes (Signed)
Subjective:    Patient ID: Ellen Kaiser, female    DOB: 1991/05/27, 30 y.o.   MRN: [id number]  Ellen Kaiser is a 29 y.o. female presenting on 05/26/2020 for Vaginal Discharge (yellowish discharge w/ foul odor x 1 day. The pt has a history of bv and think that she is having another episode. She recently got a vaginal piercing and temporary stop using the boric acid suppositories as usual. )   HPI  Ellen Kaiser presents to clinic for concerns of yellowish vaginal discharge with odor x 1 day.  Has a history of chronic bacterial vaginosis.  She has previously used boric acid suppositories but stopped over the past 3 weeks since she had gotten a vaginal piercing and then her symptoms started.  Offered and accepted STI testing.    Depression screen [facility] 2/9 01/07/2020 10/08/2019 05/24/2019  Decreased Interest 2 2 1   Down, Depressed, Hopeless 2 1 1   PHQ - 2 Score 4 3 2   Altered sleeping 1 1 0  Tired, decreased energy 1 1 0  Change in appetite 2 2 2   Feeling bad or failure about yourself  0 0 0  Trouble concentrating 1 2 3   Moving slowly or fidgety/restless 1 3 0  Suicidal thoughts 1 0 0  PHQ-9 Score 11 12 7   Difficult doing work/chores Not difficult at all Very difficult Somewhat difficult    Social History   Tobacco Use  . Smoking status: Current Every Day Smoker    Packs/day: 0.50    Years: 10.00    Pack years: 5.00    Types: Cigarettes  . Smokeless tobacco: Never Used  Vaping Use  . Vaping Use: Former  . Substances: THC  Substance Use Topics  . Alcohol use: Yes    Alcohol/week: 3.0 standard drinks    Types: 3 Cans of beer per week    Comment: 3 beers weekly  . Drug use: Yes    Types: Marijuana    Review of Systems  Constitutional: Negative.   HENT: Negative.   Eyes: Negative.   Respiratory: Negative.   Cardiovascular: Negative.   Gastrointestinal: Negative.   Endocrine: Negative.   Genitourinary: Positive for vaginal discharge. Negative for decreased  urine volume, difficulty urinating, dyspareunia, dysuria, enuresis, flank pain, frequency, genital sores, hematuria, menstrual problem, pelvic pain, urgency, vaginal bleeding and vaginal pain.  Musculoskeletal: Negative.   Skin: Negative.   Allergic/Immunologic: Negative.   Neurological: Negative.   Hematological: Negative.   Psychiatric/Behavioral: Negative.    Per HPI unless specifically indicated above     Objective:    BP 118/90 (BP Location: Right Arm, Patient Position: Sitting, Cuff Size: Normal)   Pulse 81   Resp 18   Ht 5\' 1"  (1.549 m)   Wt 161 lb 3.2 oz (73.1 kg)   SpO2 100%   BMI 30.46 kg/m   Wt Readings from Last 3 Encounters:  05/26/20 161 lb 3.2 oz (73.1 kg)  04/10/20 156 lb 12.8 oz (71.1 kg)  01/07/20 154 lb 12.8 oz (70.2 kg)    Physical Exam Vitals and nursing note reviewed.  Constitutional:      General: She is not in acute distress.    Appearance: Normal appearance. She is well-developed and well-groomed. She is not ill-appearing or toxic-appearing.  HENT:     Head: Normocephalic and atraumatic.     Nose:     Comments: is in place, covering mouth and nose. Eyes:     General: Lids are normal. Vision  grossly intact. No scleral icterus.       Right eye: No discharge.        Left eye: No discharge.     Extraocular Movements: Extraocular movements intact.     Conjunctiva/sclera: Conjunctivae normal.     Pupils: Pupils are equal, round, and reactive to light.  Cardiovascular:     Rate and Rhythm: Normal rate and regular rhythm.     Pulses: Normal pulses.          Dorsalis pedis pulses are 2+ on the right side and 2+ on the left side.     Heart sounds: Normal heart sounds. No murmur heard.  No friction rub. No gallop.   Pulmonary:     Effort: Pulmonary effort is normal. No respiratory distress.     Breath sounds: Normal breath sounds.  Abdominal:     General: Abdomen is flat.     Palpations: Abdomen is soft. There is no hepatomegaly or  splenomegaly.     Tenderness: There is no abdominal tenderness.     Hernia: There is no hernia in the left inguinal area or right inguinal area.  Genitourinary:    General: Normal vulva.     Exam position: Lithotomy position.     Pubic Area: No rash or pubic lice.      Labia:        Right: No rash, tenderness, lesion or injury.        Left: No rash, tenderness, lesion or injury.      Urethra: No urethral pain, urethral swelling or urethral lesion.     Vagina: No signs of injury. Vaginal discharge present. No tenderness.  Musculoskeletal:     Right lower leg: No edema.     Left lower leg: No edema.  Feet:     Right foot:     Skin integrity: Skin integrity normal.     Left foot:     Skin integrity: Skin integrity normal.  Lymphadenopathy:     Lower Body: No right inguinal adenopathy. No left inguinal adenopathy.  Skin:    General: Skin is warm and dry.     Capillary Refill: Capillary refill takes less than 2 seconds.  Neurological:     General: No focal deficit present.     Mental Status: She is alert and oriented to person, place, and time.     Cranial Nerves: No cranial nerve deficit.     Sensory: No sensory deficit.     Motor: No weakness.     Coordination: Coordination normal.     Gait: Gait normal.  Psychiatric:        Attention and Perception: Attention and perception normal.        Mood and Affect: Mood and affect normal.        Speech: Speech normal.        Behavior: Behavior normal. Behavior is cooperative.        Thought Content: Thought content normal.        Cognition and Memory: Cognition and memory normal.        Judgment: Judgment normal.    Results for orders placed or performed during the hospital encounter of 01/15/20  Hsv Culture And Typing   Specimen: Vulva; Other  Result Value Ref Range   HSV Culture/Type Comment    Source of Sample VULVA       Assessment & Plan:   Problem List Items Addressed This Visit      Genitourinary   Vaginitis -  Primary    Likely bacterial vaginosis given history of chronic BV, with lapse in preventative treatment of boric acid suppositories.  Will send swab to the lab for BV/candida testing.  Discussed STI testing, in agreement for testing, will have urine sent to the lab for GC/CT/Trich and labs for CBC, CMP, HIV, RPR and Hepatitis C.    To begin clindamycin 300mg  BID x 7 days for treatment of presumed positive bacterial vaginosis.  To resume boric acid suppositories after completion of clindamycin.      Relevant Medications   clindamycin (CLEOCIN) 300 MG capsule   Other Relevant Orders   Cervicovaginal ancillary only   Urine cytology ancillary only   Recurrent vaginitis    See vaginitis AP      Relevant Orders   CBC with Differential   COMPLETE METABOLIC PANEL WITH GFR   HIV antibody (with reflex)   RPR   Hepatitis C Antibody     Other   Needs flu shot    Pt < age 41.  Needs annual influenza vaccine.  VIS provided.  Plan: 1. Administer Quad flu vaccine.          Meds ordered this encounter  Medications  . clindamycin (CLEOCIN) 300 MG capsule    Sig: Take 1 capsule (300 mg total) by mouth in the morning and at bedtime for 7 days.    Dispense:  14 capsule    Refill:  0   Follow up plan: Return if symptoms worsen or fail to improve.   76, FNP Family Nurse Practitioner [facility] 05/26/2020, 9:42 AM

## 2020-05-26 NOTE — Patient Instructions (Signed)
Have your labs drawn and we will send your urine and swab to the lab for testing.  I have sent in a prescription for Clindamycin 300mg  to take 1 tablet 2x per day for the next 7 days  We will plan to see you back if your symptoms worsen or fail to improve  You will receive a survey after today's visit either digitally by e-mail or paper by . Your experiences and feedback matter to [employer].  Please respond so we know how we are doing as we provide care for you.  Call us with any questions/concerns/needs.  It is my goal to be available to you for your health concerns.  Thanks for choosing me to be a partner in your healthcare needs!  [place], FNP-C Family Nurse Practitioner [facility] Phone: [phone]

## 2020-05-26 NOTE — Assessment & Plan Note (Signed)
Pt < age 29.  Needs annual influenza vaccine.  VIS provided.  Plan: 1. Administer Quad flu vaccine.  

## 2020-05-26 NOTE — Assessment & Plan Note (Signed)
Likely bacterial vaginosis given history of chronic BV, with lapse in preventative treatment of boric acid suppositories.  Will send swab to the lab for BV/candida testing.  Discussed STI testing, in agreement for testing, will have urine sent to the lab for GC/CT/Trich and labs for CBC, CMP, HIV, RPR and Hepatitis C.    To begin clindamycin 300mg  BID x 7 days for treatment of presumed positive bacterial vaginosis.  To resume boric acid suppositories after completion of clindamycin.

## 2020-05-27 LAB — CBC WITH DIFFERENTIAL/PLATELET
Absolute Monocytes: 358 cells/uL (ref 200–950)
Basophils Absolute: 29 cells/uL (ref 0–200)
Basophils Relative: 0.6 %
Eosinophils Absolute: 20 cells/uL (ref 15–500)
Eosinophils Relative: 0.4 %
HCT: 43.2 % (ref 35.0–45.0)
Hemoglobin: 14.6 g/dL (ref 11.7–15.5)
Lymphs Abs: 2141 cells/uL (ref 850–3900)
MCH: 34.1 pg — ABNORMAL HIGH (ref 27.0–33.0)
MCHC: 33.8 g/dL (ref 32.0–36.0)
MCV: 100.9 fL — ABNORMAL HIGH (ref 80.0–100.0)
MPV: 10.3 fL (ref 7.5–12.5)
Monocytes Relative: 7.3 %
Neutro Abs: 2352 cells/uL (ref 1500–7800)
Neutrophils Relative %: 48 %
Platelets: 258 10*3/uL (ref 140–400)
RBC: 4.28 10*6/uL (ref 3.80–5.10)
RDW: 12.1 % (ref 11.0–15.0)
Total Lymphocyte: 43.7 %
WBC: 4.9 10*3/uL (ref 3.8–10.8)

## 2020-05-27 LAB — COMPLETE METABOLIC PANEL WITH GFR
AG Ratio: 2.2 (calc) (ref 1.0–2.5)
ALT: 15 U/L (ref 6–29)
AST: 16 U/L (ref 10–30)
Albumin: 4.7 g/dL (ref 3.6–5.1)
Alkaline phosphatase (APISO): 54 U/L (ref 31–125)
BUN: 11 mg/dL (ref 7–25)
CO2: 22 mmol/L (ref 20–32)
Calcium: 9.8 mg/dL (ref 8.6–10.2)
Chloride: 107 mmol/L (ref 98–110)
Creat: 0.95 mg/dL (ref 0.50–1.10)
GFR, Est African American: 94 mL/min/{1.73_m2} (ref >=60)
GFR, Est Non African American: 81 mL/min/{1.73_m2} (ref >=60)
Globulin: 2.1 g/dL (calc) (ref 1.9–3.7)
Glucose, Bld: 81 mg/dL (ref 65–99)
Potassium: 4.3 mmol/L (ref 3.5–5.3)
Sodium: 139 mmol/L (ref 135–146)
Total Bilirubin: 0.9 mg/dL (ref 0.2–1.2)
Total Protein: 6.8 g/dL (ref 6.1–8.1)

## 2020-05-27 LAB — HEPATITIS C ANTIBODY
Hepatitis C Ab: NONREACTIVE
SIGNAL TO CUT-OFF: 0.01 (ref <1.00)

## 2020-05-27 LAB — RPR: RPR Ser Ql: NONREACTIVE

## 2020-05-27 LAB — HIV ANTIBODY (ROUTINE TESTING W REFLEX): HIV 1&2 Ab, 4th Generation: NONREACTIVE

## 2020-05-28 LAB — URINE CYTOLOGY ANCILLARY ONLY
Chlamydia: NEGATIVE
Comment: NEGATIVE
Comment: NEGATIVE
Comment: NORMAL
Neisseria Gonorrhea: NEGATIVE
Trichomonas: NEGATIVE

## 2020-05-28 LAB — CERVICOVAGINAL ANCILLARY ONLY: Candida Vaginitis: NEGATIVE

## 2020-05-29 LAB — CERVICOVAGINAL ANCILLARY ONLY
Bacterial Vaginitis (gardnerella): POSITIVE — AB
Candida Glabrata: NEGATIVE
Comment: NEGATIVE
Comment: NEGATIVE
Comment: NEGATIVE

## 2020-07-16 ENCOUNTER — Ambulatory Visit (INDEPENDENT_AMBULATORY_CARE_PROVIDER_SITE_OTHER): Payer: Medicare Other | Admitting: Dermatology

## 2020-07-16 ENCOUNTER — Other Ambulatory Visit: Payer: Self-pay

## 2020-07-16 DIAGNOSIS — L708 Other acne: Secondary | ICD-10-CM | POA: Diagnosis not present

## 2020-07-16 DIAGNOSIS — L905 Scar conditions and fibrosis of skin: Secondary | ICD-10-CM | POA: Diagnosis not present

## 2020-07-16 DIAGNOSIS — L732 Hidradenitis suppurativa: Secondary | ICD-10-CM | POA: Diagnosis not present

## 2020-07-16 MED ORDER — CLINDAMYCIN PHOSPHATE 1 % EX LOTN: TOPICAL_LOTION | Freq: Two times a day (BID) | CUTANEOUS | 1 refills | Status: DC

## 2020-07-16 MED ORDER — SULFACETAMIDE SODIUM-SULFUR 8-4 % EX SUSP: CUTANEOUS | 1 refills | Status: DC

## 2020-07-16 NOTE — Progress Notes (Signed)
Follow-Up Visit   Subjective  Ellen Kaiser is a 30 y.o. female who presents for the following: Acne (5 months f/u on acne on face, treating with Adapalene gel with a poor response ) and Skin Problem (5 months f/u folliculitis in the groin, treating with Mometasone cream with a fair response, pt took Doxycycline tablets for 1 month with a poor response ).  The following portions of the chart were reviewed this encounter and updated as appropriate:   Tobacco  Allergies  Meds  Problems  Med Hx  Surg Hx  Fam Hx     Review of Systems:  No other skin or systemic complaints except as noted in HPI or Assessment and Plan.  Objective  Well appearing patient in no apparent distress; mood and affect are within normal limits.  A focused examination was performed including face,groin . Relevant physical exam findings are noted in the Assessment and Plan.  Objective  pubic area: Cystic papule at the L groin   Objective  face: Spotty hyperpigmentation, confluent  deep comedones of the face and scarring    Assessment & Plan  Hidradenitis suppurativa pubic area Chronic and persistent   Start Clindamycin lotion apply to skin daily  Start SulfaCleanse wash daily  Pt will get otc CLN wash if SulfaCleanse is not covered by insurance  Plan on starting Isotretinoin pending   Hidradenitis Suppurativa is a chronic; persistent; non-curable, but treatable condition due to abnormal inflamed sweat glands in the body folds (axilla, inframammary, groin, medial thighs), causing recurrent painful cysts and scarring. It can be associated with severe scarring acne and cysts; abscesses and scarring of scalp. The goal is control and prevention of flares, as it is not curable. Scars are permanent and can be thickened. Treatment may include daily use of topical medication and oral antibiotics.  Oral isotretinoin may also be helpful.  For more severe cases, Humira (a biologic injection) may be  prescribed to decrease the inflammatory process and prevent flares.  When indicated, inflamed cysts may also be treated surgically.  Reviewed potential side effects of isotretinoin including xerosis, cheilitis, hepatitis, hyperlipidemia, and severe birth defects if taken by a pregnant woman. Reviewed reports of suicidal ideation in those with a history of depression while taking isotretinoin and reports of diagnosis of inflammatory bowl disease while taking isotretinoin as well as the lack of evidence for a causal relationship between isotretinoin, depression and IBD. Patient advised to reach out with any questions or concerns. Patient advised not to share pills or donate blood while on treatment or for one month after completing treatment.   Ordered Medications: clindamycin (CLEOCIN-T) 1 % lotion Sulfacetamide Sodium-Sulfur (SULFACLEANSE 8/4) 8-4 % SUSP  Acne with scarring Face severe Acne   Start Clindamycin lotion apply to face bid  Discussed Isotretinoin  Plan on starting Isotretinoin in 30 days   Reviewed potential side effects of isotretinoin including xerosis, cheilitis, hepatitis, hyperlipidemia, and severe birth defects if taken by a pregnant woman. Reviewed reports of suicidal ideation in those with a history of depression while taking isotretinoin and reports of diagnosis of inflammatory bowl disease while taking isotretinoin as well as the lack of evidence for a causal relationship between isotretinoin, depression and IBD. Patient advised to reach out with any questions or concerns. Patient advised not to share pills or donate blood while on treatment or for one month after completing treatment.   Birth control method-IUD I pledge #[id number] Patient Registered in iPledge program   Urine pregnancy test  performed in office today and was negative.  Patient demonstrates comprehension and confirms she will not get pregnant.    Ordered Medications: clindamycin (CLEOCIN-T) 1 %  lotion  Return in about 30 days (around 08/15/2020) for Isotretinoin .  IAngelique Holm, CMA, am acting as scribe for Armida Sans, MD .  Documentation: I have reviewed the above documentation for accuracy and completeness, and I agree with the above.  Armida Sans, MD

## 2020-07-16 NOTE — Patient Instructions (Signed)
Start over CLN wash if SulfaCleanse is not covered by insurance

## 2020-07-18 ENCOUNTER — Encounter: Payer: Self-pay | Admitting: Dermatology

## 2020-07-29 ENCOUNTER — Telehealth: Payer: Self-pay

## 2020-07-29 NOTE — Telephone Encounter (Signed)
Please advise her that I got her message and that if she decides to pursue treatment in the future, we would be happy to see her for an appointment at [facility] when she is ready to  discuss her treatment options.

## 2020-07-29 NOTE — Telephone Encounter (Signed)
Would Hibiclens or puracyn be a good alternative for patient since Sulfa cleanse not covered?

## 2020-07-29 NOTE — Telephone Encounter (Signed)
Called patient regarding Sulfa wash sent in. This is not covered by [employer]. Patient declined cash pay option with [employer] card at [employer].   Also patient has now declined starting accutane. She states the more she researches and thinks about this, she does not want to do treatment at this time.

## 2020-07-29 NOTE — Telephone Encounter (Signed)
I would recommend CLN wash as a soap/ cleanser.  Can be used daily as her bathing wash/soap.

## 2020-07-30 NOTE — Telephone Encounter (Signed)
Went over great detail with patient today regarding HS and treatment options. Patient states she does not feel like we are explaning the causes to her 100%. I went over detail from last office visit with patient on the phone again. Patient defers treatment options for washes at this time and will call back later to schedule.

## 2020-08-01 ENCOUNTER — Other Ambulatory Visit: Payer: Self-pay | Admitting: Dermatology

## 2020-08-01 DIAGNOSIS — L732 Hidradenitis suppurativa: Secondary | ICD-10-CM

## 2020-08-04 ENCOUNTER — Ambulatory Visit (INDEPENDENT_AMBULATORY_CARE_PROVIDER_SITE_OTHER): Payer: Medicare Other | Admitting: Licensed Clinical Social Worker

## 2020-08-04 ENCOUNTER — Other Ambulatory Visit: Payer: Self-pay

## 2020-08-04 DIAGNOSIS — F39 Unspecified mood [affective] disorder: Secondary | ICD-10-CM

## 2020-08-04 DIAGNOSIS — F319 Bipolar disorder, unspecified: Secondary | ICD-10-CM

## 2020-08-04 DIAGNOSIS — F1210 Cannabis abuse, uncomplicated: Secondary | ICD-10-CM | POA: Diagnosis not present

## 2020-08-04 NOTE — Progress Notes (Signed)
Virtual Visit via Video Note  I connected with Sierra Shanequa Mcdaniel on 08/04/20 at 10:00 AM EST by a video enabled telemedicine application and verified that I am speaking with the correct person using two identifiers.  Location: Patient: home Provider: [facility]   I discussed the limitations of evaluation and management by telemedicine and the availability of in person appointments. The patient expressed understanding and agreed to proceed.  I discussed the assessment and treatment plan with the patient. The patient was provided an opportunity to ask questions and all were answered. The patient agreed with the plan and demonstrated an understanding of the instructions.   The patient was advised to call back or seek an in-person evaluation if the symptoms worsen or if the condition fails to improve as anticipated.  I provided 45 minutes of non-face-to-face time during this encounter.   Demontrez Rindfleisch R Tristan Bramble, LCSW    THERAPIST PROGRESS NOTE  Session Time: 10-10:45a  Participation Level: Active  Behavioral Response: Neat and Well GroomedAlertAnxious and Irritable  Type of Therapy: Individual Therapy  Treatment Goals addressed: Improve coping skills; improve overall mood, trauma processing  Interventions: CBT, DBT, Motivational Interviewing and Supportive  Summary: Ellen Kaiser is a 30 y.o. female who presents with continuing symptoms related to mood disorder and substance use. Pt reports that she experiences some mood fluctuations and is trying hard to control them.   Allowed pt to explore and express her current living situation and current life needs at the moment. Discussed resources locally and will email pt packets of local resources in [place], [place], [place], [place], and [place] counties.  Discussed self care, social engagement, activity levels.Encouraged continuing self care and life balance while seeking out resources for immediate needs.    Suicidal/Homicidal: No  Therapist Response: Assessed current counseling needs and developed treatment plan.  Plan: Return again in 2 weeks.  Diagnosis: Axis I: Bipolar, mixed and Mood Disorder NOS    Axis II: No diagnosis    Ernest Haber Stellan Vick, LCSW 08/04/2020

## 2020-08-04 NOTE — Patient Instructions (Signed)
Caring for Your Mental Health Mental health is emotional, psychological, and social well-being. Mental health is just as important as physical health. In fact, mental and physical health are connected, and you need both to be healthy. Some signs of good mental health (well-being) include:  Being able to attend to tasks at home, school, or work.  Being able to manage stress and emotions.  Practicing self-care, which may include: ? A regular exercise pattern. ? A reasonably healthy diet. ? Supportive and trusting relationships. ? The ability to relax and calm yourself (self-calm).  Having pleasurable hobbies and activities to do.  Believing that you have meaning and purpose in your life.  Recovering and adjusting after facing challenges (resilience). You can take steps to build or strengthen these mentally healthy behaviors. There are resources and support to help you with this. Why is caring for mental health important? Caring for your mental health is a big part of staying healthy. Everyone has times when feelings, thoughts, or situations feel overwhelming. Mental health means having the skills to manage what feels overwhelming. If this sense of being overwhelmed persists, however, you might need some help. If you have some of the following signs, you may need to take better care of your mental health or seek help from a health care provider or mental health professional:  Problems with energy or focus.  Changes in eating habits.  Problems sleeping, such as sleeping too much or not enough.  Emotional distress, such as anger, sadness, depression, or anxiety.  Major changes in your relationships.  Losing interest in life or activities that you used to enjoy. If you have any of these symptoms on most days for 2 weeks or longer:  Talk with a close friend or family member about how you are feeling.  Contact your health care provider to discuss your symptoms.  Consider working with a  mental health professional. Your health care provider, family, or friends may be able to recommend a therapist. What can I do to promote emotional and mental health? Managing emotions  Learn to identify emotions and deal with them. Recognizing your emotions is the first step in learning to deal with them.  Practice ways to appropriately express feelings. Remember that you can control your feelings. They do not control you.  Practice stress management techniques, such as: ? Relaxation techniques, like breathing or muscle relaxation exercises. ? Exercise. Regular activity can lower your stress level. ? Changing what you can change and accepting what you cannot change.  Build up your resilience so that you can recover and adjust after big problems or challenges. Practice resilient behaviors and attitudes: ? Set and focus on long-term goals. ? Develop and maintain healthy, supportive relationships. ? Learn to accept change and make the best of the situation. ? Take care of yourself physically by eating a healthy diet, getting plenty of sleep, and exercising regularly. ? Develop self-awareness. Ask others to give feedback about how they see you. ? Practice mindfulness meditation to help you stay calm when dealing with daily challenges. ? Learn to respond to situations in healthy ways, rather than reacting with your emotions. ? Keep a positive attitude, and believe in yourself. Your view of yourself affects your mental health. ? Develop your listening and empathy skills. These will help you deal with difficult situations and communications.  Remember that emotions can be used as a good source of communication and are a great source of energy. Try to laugh and find humor in life.   Sleeping  Get the right amount and quality of sleep. Sleep has a big impact on physical and mental health. To improve your sleep: ? Go to bed and wake up around the same time every day. ? Limit screen time before  bedtime. This includes the use of your cell phone, TV, computer, and tablet. ? Keep your bedroom dark and cool. Activity  Exercise or do some physical activity regularly. This helps: ? Keep your body strong, especially during times of stress. ? Get rid of chemicals in your body (hormones) that build up when you are stressed. ? Build up your resilience.   Eating and drinking  Eat a healthy diet that includes whole grains, vegetables, fresh fruits, and lean proteins. If you have questions about what foods are best for you, ask your health care provider.  Try not to turn to sweet, salty, or otherwise unhealthy foods when you are tired or unhappy. This can lead to unwanted weight gain and is not a healthy way to cope with emotions.   Where to find more information You can find more information about how to care for your mental health from:  National Alliance on Mental Illness (NAMI): www.nami.org  National Institute of Mental Health: www.nimh.nih.gov  Centers for Disease Control and Prevention: www.cdc.gov/hrqol/wellbeing.htm Contact a health care provider if:  You lose interest in being with others or you do not want to leave the house.  You have a hard time completing your normal activities or you have less energy than normal.  You cannot stay focused or you have problems with memory.  You feel that your senses are heightened, and this makes you upset or concerned.  You feel nervous or have rapid mood changes.  You are sleeping or eating more or less than normal.  You question reality or you show odd behavior that disturbs you or others. Get help right away if:  You have thoughts about hurting yourself or others. If you ever feel like you may hurt yourself or others, or have thoughts about taking your own life, get help right away. You can go to your nearest emergency department or call:  Your local emergency services (911 in the [place]  A suicide crisis helpline, such as the  National Suicide Prevention Lifeline at 1-800-273-8255. This is open 24 hours a day. Summary  Mental health is not just the absence of mental illness. It involves understanding your emotions and behaviors, and taking steps to cope with them in a healthy way.  If you have symptoms of mental or emotional distress, get help from family, friends, a health care provider, or a mental health professional.  Practice good mental health behaviors such as stress management skills, self-calming skills, exercise, and healthy sleeping and eating. This information is not intended to replace advice given to you by your health care provider. Make sure you discuss any questions you have with your health care provider. Document Revised: 11/29/2019 Document Reviewed: 11/29/2019 Elsevier Patient Education  2021 Elsevier Inc.  

## 2020-08-05 NOTE — Telephone Encounter (Signed)
Left message on voicemail for patient to return my call. We received a refill request for Doxycycline 100mg  po QD from her pharmacy. Does patient need a refill? Her last note didn't mention continuing the medication.

## 2020-08-05 NOTE — Telephone Encounter (Signed)
Please see Telephone notes 07/29/20. Pt will need an appt to further discuss condition and treatment options before anything else prescribed.

## 2020-08-18 ENCOUNTER — Ambulatory Visit (INDEPENDENT_AMBULATORY_CARE_PROVIDER_SITE_OTHER): Payer: Medicare Other | Admitting: Family Medicine

## 2020-08-18 ENCOUNTER — Other Ambulatory Visit: Payer: Self-pay

## 2020-08-18 ENCOUNTER — Other Ambulatory Visit (HOSPITAL_COMMUNITY)
Admission: RE | Admit: 2020-08-18 | Discharge: 2020-08-18 | Disposition: A | Discharge status: Home / Self Care | Payer: Medicare Other | Source: Ambulatory Visit | Attending: Family Medicine | Admitting: Family Medicine

## 2020-08-18 ENCOUNTER — Encounter: Payer: Self-pay | Admitting: Family Medicine

## 2020-08-18 ENCOUNTER — Ambulatory Visit (INDEPENDENT_AMBULATORY_CARE_PROVIDER_SITE_OTHER): Payer: Medicare Other | Admitting: Licensed Clinical Social Worker

## 2020-08-18 VITALS — BP 122/80 | HR 60 | Ht 60.0 in | Wt 169.2 lb

## 2020-08-18 DIAGNOSIS — Z789 Other specified health status: Secondary | ICD-10-CM

## 2020-08-18 DIAGNOSIS — S01512A Laceration without foreign body of oral cavity, initial encounter: Secondary | ICD-10-CM

## 2020-08-18 DIAGNOSIS — Z113 Encounter for screening for infections with a predominantly sexual mode of transmission: Secondary | ICD-10-CM

## 2020-08-18 DIAGNOSIS — F319 Bipolar disorder, unspecified: Secondary | ICD-10-CM | POA: Diagnosis not present

## 2020-08-18 MED ORDER — MUPIROCIN 2 % EX OINT: 1.0000 "application " | TOPICAL_OINTMENT | Freq: Two times a day (BID) | CUTANEOUS | 0 refills | Status: DC

## 2020-08-18 NOTE — Progress Notes (Signed)
Virtual Visit via Video Note  I connected with Ellen Kaiser on 08/18/20 at  2:00 PM EST by a video enabled telemedicine application and verified that I am speaking with the correct person using two identifiers.  Location: Patient: home Provider: remote office [place], [place])   I discussed the limitations of evaluation and management by telemedicine and the availability of in person appointments. The patient expressed understanding and agreed to proceed.  I discussed the assessment and treatment plan with the patient. The patient was provided an opportunity to ask questions and all were answered. The patient agreed with the plan and demonstrated an understanding of the instructions.   The patient was advised to call back or seek an in-person evaluation if the symptoms worsen or if the condition fails to improve as anticipated.  I provided 45 minutes of non-face-to-face time during this encounter.   Michele Kerlin R Marcee Jacobs, LCSW    THERAPIST PROGRESS NOTE  Session Time: 2-2:44p  Participation Level: Active  Behavioral Response: NeatAlertAnxious and Irritable  Type of Therapy: Individual Therapy  Treatment Goals addressed: Anxiety and Coping  Interventions: DBT, Solution Focused and Supportive  Summary: Ellen Kaiser is a 30 y.o. female who presents with symptoms associated with bipolar disorder diagnosis. Pt reports that mood has been "up and down" with down days not being more than one or two. Pt reports that she feels that she is managing anxiety as well as she can--"I don't really worry about things--I just accept them the way they are and stay blunt with my reaction to them".   Allowed pt safe space to explore thoughts and feelings related to current life events--pt is continuing to apply for jobs, trying to weigh out pros and cons about dropping a school class, and struggling with current relationship.    Continued recommendations are as follows: self care  behaviors, positive social engagements, focusing on overall work/home/life balance, and focusing on positive physical and emotional wellness.    Suicidal/Homicidal: No  Therapist Response: Ellen Kaiser is continuing to develop strategies to reduce symptoms of anxiety and improve coping skills. Ellen Kaiser is able to identify key unresolved life conflicts and develop plans for solving them. Ellen Kaiser is expressing desire to engage in activities that will be calming, somatically. These behaviors reflect Naturi's desire to make changes that will contribute towards overall personal growth and development. Treatment to continue as indicated.   Plan: Return again in 3 weeks.  Diagnosis: Axis I: Bipolar, mixed    Axis II: No diagnosis    Ernest Haber Mylez Venable, LCSW 08/18/2020

## 2020-08-18 NOTE — Progress Notes (Addendum)
Subjective:    Patient ID: Ellen Kaiser, female    DOB: 1991-04-01, 30 y.o.   MRN: [id number]  Ellen Kaiser is a 30 y.o. female presenting on 08/18/2020 for Abrasion (Vaginal piercing is irritated )   HPI   Vaginal Abrasion / Irritation Reports a stinging burning pain and itching pain for past 1 week. Without improvement. She has tried [facility] research [facility] healing. History prior vertical labial piercing for years, now with a recent change 4 months ago, switched to horizontal piercing, and it had healed well. She has used neosporin to help heal. Admits if wearing anything tight fitting or pressing on that area causing it to be irritated. Also requests a routine check on STD - she denies any active symptoms of STD, and no current symptoms of BV. Denies any fever chills, bleeding, drainage of pus, swollen, redness, no urinary frequency.   Depression screen [facility] 2/9 01/07/2020 10/08/2019 05/24/2019  Decreased Interest 2 2 1   Down, Depressed, Hopeless 2 1 1   PHQ - 2 Score 4 3 2   Altered sleeping 1 1 0  Tired, decreased energy 1 1 0  Change in appetite 2 2 2   Feeling bad or failure about yourself  0 0 0  Trouble concentrating 1 2 3   Moving slowly or fidgety/restless 1 3 0  Suicidal thoughts 1 0 0  PHQ-9 Score 11 12 7   Difficult doing work/chores Not difficult at all Very difficult Somewhat difficult  Some encounter information is confidential and restricted. Go to Review Flowsheets activity to see all data.    Social History   Tobacco Use  . Smoking status: Current Every Day Smoker    Packs/day: 0.50    Years: 10.00    Pack years: 5.00    Types: Cigarettes  . Smokeless tobacco: Never Used  Vaping Use  . Vaping Use: Former  . Substances: THC  Substance Use Topics  . Alcohol use: Yes    Alcohol/week: 3.0 standard drinks    Types: 3 Cans of beer per week    Comment: 3 beers weekly  . Drug use: Yes    Types: Marijuana    Review of Systems Per HPI unless specifically  indicated above     Objective:    BP 122/80   Pulse 60   Ht 5' (1.524 m)   Wt 169 lb 3.2 oz (76.7 kg)   SpO2 100%   BMI 33.04 kg/m   Wt Readings from Last 3 Encounters:  08/18/20 169 lb 3.2 oz (76.7 kg)  05/26/20 161 lb 3.2 oz (73.1 kg)  04/10/20 156 lb 12.8 oz (71.1 kg)    Physical Exam Vitals and nursing note reviewed.  Constitutional:      General: She is not in acute distress.    Appearance: She is well-developed and well-nourished. She is not diaphoretic.     Comments: Well-appearing, comfortable, cooperative  HENT:     Head: Normocephalic and atraumatic.     Mouth/Throat:     Mouth: Oropharynx is clear and moist.  Eyes:     General:        Right eye: No discharge.        Left eye: No discharge.     Conjunctiva/sclera: Conjunctivae normal.  Cardiovascular:     Rate and Rhythm: Normal rate.  Pulmonary:     Effort: Pulmonary effort is normal.  Genitourinary:    Comments: Normal external female genitalia with piercing at clitoral hood without any complications, she has area below that inferiorly  with small vertical slight linear abrasion / skin separation, no active bleed, no edema, no erythema, no drainage, no asymmetry.  No internal exam performed today. No speculum used.  Patient opted for self collect vaginal swab for STD testing.  Pelvic Exam chaperoned by Randa Lynn, CMA  Musculoskeletal:        General: No edema.  Skin:    General: Skin is warm and dry.     Findings: No erythema or rash.  Neurological:     Mental Status: She is alert and oriented to person, place, and time.  Psychiatric:        Mood and Affect: Mood and affect normal.        Behavior: Behavior normal.     Comments: Well groomed, good eye contact, normal speech and thoughts       Results for orders placed or performed in visit on 05/26/20  CBC with Differential  Result Value Ref Range   WBC 4.9 3.8 - 10.8 Thousand/uL   RBC 4.28 3.80 - 5.10 Million/uL   Hemoglobin 14.6 11.7  - 15.5 g/dL   HCT 92.4 26.8 - 34.1 %   MCV 100.9 (H) 80.0 - 100.0 fL   MCH 34.1 (H) 27.0 - 33.0 pg   MCHC 33.8 32.0 - 36.0 g/dL   RDW 96.2 22.9 - 79.8 %   Platelets 258 140 - 400 Thousand/uL   MPV 10.3 7.5 - 12.5 fL   Neutro Abs 2,352 1,500 - 7,800 cells/uL   Lymphs Abs 2,141 850 - 3,900 cells/uL   Absolute Monocytes 358 200 - 950 cells/uL   Eosinophils Absolute 20 15 - 500 cells/uL   Basophils Absolute 29 0 - 200 cells/uL   Neutrophils Relative % 48 %   Total Lymphocyte 43.7 %   Monocytes Relative 7.3 %   Eosinophils Relative 0.4 %   Basophils Relative 0.6 %  COMPLETE METABOLIC PANEL WITH GFR  Result Value Ref Range   Glucose, Bld 81 65 - 99 mg/dL   BUN 11 7 - 25 mg/dL   Creat 9.21 1.94 - 1.74 mg/dL   GFR, Est Non African American 81 > OR = 60 mL/min/1.63m2   GFR, Est African American 94 > OR = 60 mL/min/1.65m2   BUN/Creatinine Ratio NOT APPLICABLE 6 - 22 (calc)   Sodium 139 135 - 146 mmol/L   Potassium 4.3 3.5 - 5.3 mmol/L   Chloride 107 98 - 110 mmol/L   CO2 22 20 - 32 mmol/L   Calcium 9.8 8.6 - 10.2 mg/dL   Total Protein 6.8 6.1 - 8.1 g/dL   Albumin 4.7 3.6 - 5.1 g/dL   Globulin 2.1 1.9 - 3.7 g/dL (calc)   AG Ratio 2.2 1.0 - 2.5 (calc)   Total Bilirubin 0.9 0.2 - 1.2 mg/dL   Alkaline phosphatase (APISO) 54 31 - 125 U/L   AST 16 10 - 30 U/L   ALT 15 6 - 29 U/L  HIV antibody (with reflex)  Result Value Ref Range   HIV 1&2 Ab, 4th Generation NON-REACTIVE NON-REACTI  RPR  Result Value Ref Range   RPR Ser Ql NON-REACTIVE NON-REACTI  Hepatitis C Antibody  Result Value Ref Range   Hepatitis C Ab NON-REACTIVE NON-REACTI   SIGNAL TO CUT-OFF 0.01 <1.00  Cervicovaginal ancillary only  Result Value Ref Range   Bacterial Vaginitis (gardnerella) Positive (A)    Candida Vaginitis Negative    Candida Glabrata Negative    Comment      Normal Reference Range Bacterial  Vaginosis - Negative   Comment Normal Reference Range Candida Species - Negative    Comment Normal  Reference Range Candida Galbrata - Negative   Urine cytology ancillary only  Result Value Ref Range   Neisseria Gonorrhea Negative    Chlamydia Negative    Trichomonas Negative    Comment Normal Reference Ranger Chlamydia - Negative    Comment      Normal Reference Range Neisseria Gonorrhea - Negative   Comment Normal Reference Range Trichomonas - Negative       Assessment & Plan:   Problem List Items Addressed This Visit   None   Visit Diagnoses    Laceration of labial mucosa without complication, initial encounter    -  Primary   Relevant Medications   mupirocin ointment (BACTROBAN) 2 %   Body piercing       Screening examination for STD (sexually transmitted disease)       Relevant Orders   Cervicovaginal ancillary only   HIV Antibody (routine testing w rflx)   RPR      Very mild abrasion in sensitive area vaginal/labial mucosal tissue - actually inferior to piercing, unrelated to piercing from my exam. No sign of UTI as well. No secondary infection or abscess or complication Will trial rx Mupirocin ointment BID 7-10 days prevent infection May use topical barrier cream ointment / triple paste as needed to protect sensitive skin Abstain from sexual intercourse and triggering activities that can disrupt this skin tear tissue  Routine STD check, self collect swab and blood tests for HIV / RPR, pending. Reassurance, asymptomatic  Meds ordered this encounter  Medications  . mupirocin ointment (BACTROBAN) 2 %    Sig: Apply 1 application topically 2 (two) times daily. For 7-10 days until healed.    Dispense:  22 g    Refill:  0      Follow up plan: Return in about 6 months (around 02/15/2021) for 6 month meet new PCP follow-up, then physical in Feb 2023.   Saralyn Pilar, DO [facility] 08/18/2020, 8:36 AM

## 2020-08-18 NOTE — Patient Instructions (Addendum)
Thank you for coming to the office today.  No significant bacterial infection. It should continue to heal.  Looks like a small laceration of the mucosal skin that has to heal, may take 7-10 more days possibly. Goal to avoid any direct friction that would re-open it. I would recommend avoid sexual intercourse for several more days to allow it to heal.  Use rx Bactroban mupirocin antibiotic ointment stronger than neosporin for next 1 week. Twice a day.  Use topical barrier cream ointment or triple paste or topical barrier to prevent irritation of skin. For 1-2 weeks.  Schedule to meet [facility] new nurse practitioner in Fall 2022 then physical in Winter/Spring 2023  Labs collected, stay tuned on results.    Please schedule a Follow-up Appointment to: Return in about 6 months (around 02/15/2021) for 6 month meet new PCP follow-up, then physical in Feb 2023.  If you have any other questions or concerns, please feel free to call the office or send a message through MyChart. You may also schedule an earlier appointment if necessary.  Additionally, you may be receiving a survey about your experience at our office within a few days to 1 week by e-mail or mail. We value your feedback.  Saralyn Pilar, DO [facility], [place]

## 2020-08-19 LAB — CERVICOVAGINAL ANCILLARY ONLY
Bacterial Vaginitis (gardnerella): NEGATIVE
Candida Glabrata: NEGATIVE
Candida Vaginitis: NEGATIVE
Chlamydia: NEGATIVE
Comment: NEGATIVE
Comment: NEGATIVE
Comment: NEGATIVE
Comment: NEGATIVE
Comment: NEGATIVE
Comment: NORMAL
Neisseria Gonorrhea: NEGATIVE
Trichomonas: NEGATIVE

## 2020-08-19 LAB — RPR: RPR Ser Ql: NONREACTIVE

## 2020-08-19 LAB — HIV ANTIBODY (ROUTINE TESTING W REFLEX): HIV 1&2 Ab, 4th Generation: NONREACTIVE

## 2020-09-09 ENCOUNTER — Ambulatory Visit (INDEPENDENT_AMBULATORY_CARE_PROVIDER_SITE_OTHER): Payer: Medicare Other | Admitting: Licensed Clinical Social Worker

## 2020-09-09 ENCOUNTER — Other Ambulatory Visit: Payer: Self-pay

## 2020-09-09 DIAGNOSIS — F39 Unspecified mood [affective] disorder: Secondary | ICD-10-CM | POA: Diagnosis not present

## 2020-09-09 DIAGNOSIS — F319 Bipolar disorder, unspecified: Secondary | ICD-10-CM | POA: Diagnosis not present

## 2020-09-09 NOTE — Progress Notes (Signed)
Virtual Visit via Video Note  I connected with Ellen Kaiser on 09/09/20 at  1:00 PM EDT by a video enabled telemedicine application and verified that I am speaking with the correct person using two identifiers.  Location: Patient: home Provider: [facility]   I discussed the limitations of evaluation and management by telemedicine and the availability of in person appointments. The patient expressed understanding and agreed to proceed.  I discussed the assessment and treatment plan with the patient. The patient was provided an opportunity to ask questions and all were answered. The patient agreed with the plan and demonstrated an understanding of the instructions.   The patient was advised to call back or seek an in-person evaluation if the symptoms worsen or if the condition fails to improve as anticipated.  I provided 39 minutes of non-face-to-face time during this encounter.   Christina R Hussami, LCSW    THERAPIST PROGRESS NOTE  Session Time: 1-1:39p  Participation Level: Active  Behavioral Response: NeatAlertAnxious  Type of Therapy: Individual Therapy  Treatment Goals addressed: Anxiety  Interventions: CBT and DBT  Summary: Ellen Kaiser is a 30 y.o. female who presents with improving symptoms related to bipolar disorder diagnosis. Pt reports that overall mood has been stable and that pt is getting good quality and quantity of sleep (with medication).    Allowed pt to explore and express thoughts and feeling around recent life events: trying to get a new job at [employer], new job at [employer] at [employer], getting a recent speeding ticket, trying to find stable and affordable housing, identifying panic/trauma triggers, and managing anxiety/panic in the moment.  Allowed pt to review current anxiety/panic coping mechanisms and discussed new ways of coping.  Continued recommendations are as follows: self care behaviors, positive social engagements, focusing on  overall work/home/life balance, and focusing on positive physical and emotional wellness.    Suicidal/Homicidal: No  Therapist Response: [name] is able to identify triggers and utilize coping mechanisms to manage anxiety in the moment. [name] is able to implement appropriate relaxation and diversion activities to decrease overall levels of anxiety. [name] is continuing to develop skills that will allow her to engage socially more with peers. These behaviors are reflective of personal growth and progress. Treatment to continue.   Plan: Return again in 4 weeks.  Diagnosis: Axis I: Bipolar, mixed    Axis II: No diagnosis    Ellen Haber Hussami, LCSW 09/09/2020

## 2020-10-01 ENCOUNTER — Ambulatory Visit (INDEPENDENT_AMBULATORY_CARE_PROVIDER_SITE_OTHER): Payer: Medicare Other | Admitting: Licensed Clinical Social Worker

## 2020-10-01 ENCOUNTER — Other Ambulatory Visit: Payer: Self-pay

## 2020-10-01 DIAGNOSIS — F319 Bipolar disorder, unspecified: Secondary | ICD-10-CM | POA: Diagnosis not present

## 2020-10-01 NOTE — Progress Notes (Signed)
Virtual Visit via Video Note  I connected with Ellen Kaiser on 10/01/20 at 11:00 AM EDT by a video enabled telemedicine application and verified that I am speaking with the correct person using two identifiers.  Location: Patient: home Provider: [facility]   I discussed the limitations of evaluation and management by telemedicine and the availability of in person appointments. The patient expressed understanding and agreed to proceed.   I discussed the assessment and treatment plan with the patient. The patient was provided an opportunity to ask questions and all were answered. The patient agreed with the plan and demonstrated an understanding of the instructions.   The patient was advised to call back or seek an in-person evaluation if the symptoms worsen or if the condition fails to improve as anticipated.  I provided 45 minutes of non-face-to-face time during this encounter.   Ellen Kaiser R Ellen Edling, LCSW    THERAPIST PROGRESS NOTE  Session Time: 11-11:45a  Participation Level: Active  Behavioral Response: Neat and Well GroomedAlertAnxious  Type of Therapy: Individual Therapy  Treatment Goals addressed: Anxiety  Interventions: CBT and Reframing  Summary: Ellen Kaiser is a 30 y.o. female who presents with improving symptoms related to bipolar disorder. Pt feels that mood continues to be triggered situationally, and that anxiety/stress is also triggered situationally.   Allowed pt to reflect on recent external stressors. Pt states that she had an incident where she contesting charges on a credit card and got highly emotional about it. Discussed ways that pt managed the situation and brainstormed through other ways that the situation could have been handled. Pt feels okay and states "I got my money back and that's all that matters".   Explored present and past relationship with mother. Pt states that her mother recently reached out to her and wants to rekindle their  relationship. Pt states that she would like to continue setting limits and boundaries with her mother, due to traumatic events that she has experienced in the past.   Explored recent dating scenarios and pts wants/expectation in a partner(s). Pt is not happy with current partner and has been trying to let her know this for months now.   Discussed pt not getting job that she wanted and the feelings that were triggered. Pt continuing to be proactive about finding a job to pay off school-related debt. Pt has decided to withdraw from school at this point in time. Pt may re-enroll once she is feeling more stable with work.  Continued recommendations are as follows: self care behaviors, positive social engagements, focusing on overall work/home/life balance, and focusing on positive physical and emotional wellness.    Suicidal/Homicidal: No  Therapist Response: Ellen Kaiser is able to identify triggers and utilize coping mechanisms to manage anxiety in the moment. Ellen Kaiser is able to implement appropriate relaxation and diversion activities to decrease overall levels of anxiety. Ellen Kaiser is continuing to develop skills that will allow her to engage socially more with peers. These behaviors are reflective of personal growth and progress. Treatment to continue  Plan: Return again in 3 weeks.  Diagnosis: Axis I: Bipolar, recurrent     Axis II: No diagnosis    Ernest Haber Joey Lierman, LCSW 10/01/2020

## 2020-10-27 ENCOUNTER — Other Ambulatory Visit: Payer: Self-pay

## 2020-10-27 ENCOUNTER — Emergency Department
Admission: EM | Admit: 2020-10-27 | Discharge: 2020-10-27 | Disposition: A | Discharge status: Home / Self Care | Payer: Medicare Other | Source: Home / Self Care | Attending: Emergency Medicine | Admitting: Emergency Medicine

## 2020-10-27 ENCOUNTER — Emergency Department
Admission: EM | Admit: 2020-10-27 | Discharge: 2020-10-27 | Disposition: A | Discharge status: Home / Self Care | Payer: Medicare Other | Attending: Student in an Organized Health Care Education/Training Program | Admitting: Student in an Organized Health Care Education/Training Program

## 2020-10-27 DIAGNOSIS — H53149 Visual discomfort, unspecified: Secondary | ICD-10-CM | POA: Diagnosis not present

## 2020-10-27 DIAGNOSIS — J45909 Unspecified asthma, uncomplicated: Secondary | ICD-10-CM | POA: Insufficient documentation

## 2020-10-27 DIAGNOSIS — F17210 Nicotine dependence, cigarettes, uncomplicated: Secondary | ICD-10-CM | POA: Diagnosis not present

## 2020-10-27 DIAGNOSIS — R519 Headache, unspecified: Secondary | ICD-10-CM | POA: Diagnosis not present

## 2020-10-27 DIAGNOSIS — H93299 Other abnormal auditory perceptions, unspecified ear: Secondary | ICD-10-CM | POA: Insufficient documentation

## 2020-10-27 DIAGNOSIS — Z5321 Procedure and treatment not carried out due to patient leaving prior to being seen by health care provider: Secondary | ICD-10-CM | POA: Insufficient documentation

## 2020-10-27 MED ORDER — ACETAMINOPHEN 500 MG PO TABS
1000.0000 mg | ORAL_TABLET | Freq: Once | ORAL | Status: AC
  Administered 2020-10-27: 1000 mg via ORAL
  Filled 2020-10-27: qty 2

## 2020-10-27 MED ORDER — DIPHENHYDRAMINE HCL 50 MG/ML IJ SOLN
12.5000 mg | Freq: Once | INTRAMUSCULAR | Status: AC
  Administered 2020-10-27: 12.5 mg via INTRAVENOUS
  Filled 2020-10-27: qty 1

## 2020-10-27 MED ORDER — DEXAMETHASONE SODIUM PHOSPHATE 10 MG/ML IJ SOLN
10.0000 mg | Freq: Once | INTRAMUSCULAR | Status: AC
  Administered 2020-10-27: 10 mg via INTRAVENOUS
  Filled 2020-10-27: qty 1

## 2020-10-27 MED ORDER — PROCHLORPERAZINE EDISYLATE 10 MG/2ML IJ SOLN
10.0000 mg | Freq: Once | INTRAMUSCULAR | Status: AC
  Administered 2020-10-27: 10 mg via INTRAVENOUS
  Filled 2020-10-27: qty 2

## 2020-10-27 MED ORDER — SODIUM CHLORIDE 0.9 % IV BOLUS
1000.0000 mL | Freq: Once | INTRAVENOUS | Status: AC
  Administered 2020-10-27: 1000 mL via INTRAVENOUS

## 2020-10-27 NOTE — ED Notes (Addendum)
Patient eloped from room prior to being seen by this RN and MD. Charge nurse and MD aware.

## 2020-10-27 NOTE — ED Provider Notes (Signed)
[facility] Emergency Department Provider Note    Event Date/Time   First MD Initiated Contact with Patient 10/27/20 0145     (approximate)  I have reviewed the triage vital signs and the nursing notes.   HISTORY  Chief Complaint Headache    HPI Ellen Kaiser is a 30 y.o. female below listed past medical history presents to the ER for evaluation of generalized headache associated with photophobia and sensitivity to loud noises.  Has a history of migraines this feels similar.  Has had headache for the past 24 hours.  No fevers.  No neck stiffness.  No numbness or tingling.  Not the worst headache of her life.  Was not sudden onset.    Past Medical History:  Diagnosis Date  . Allergy   . Anxiety   . Asthma   . Bipolar 1 disorder (HCC)   . BV (bacterial vaginosis)   . Depression   . HSV-2 seropositive    Family History  Problem Relation Age of Onset  . Depression Mother   . Alcohol abuse Mother   . Bipolar disorder Mother    History reviewed. No pertinent surgical history. Patient Active Problem List   Diagnosis Date Noted  . Needs flu shot 05/26/2020  . Recurrent vaginitis 05/26/2020  . Muscle strain 04/10/2020  . Cramp and spasm 04/10/2020  . Routine medical exam 01/07/2020  . Hidradenitis suppurativa 12/28/2019  . Neck strain, initial encounter 11/12/2019  . Back pain 11/12/2019  . Encounter to establish care with new doctor 10/08/2019  . Vaginitis 10/08/2019  . Anxiety 10/08/2019  . Asthma 10/08/2019  . GERD (gastroesophageal reflux disease) 10/08/2019  . Depression, major, single episode, mild (HCC) 10/08/2019  . 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 Bipolar 1 disorder (HCC) 04/02/2019  . Tobacco use disorder 04/02/2019  . Migraine without aura and without status migrainosus, not intractable 04/02/2019  . 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 Mild intermittent asthma without complication 03/21/2015  . Allergic rhinitis 03/21/2015       Prior to Admission medications   Medication Sig Start Date End Date Taking? Authorizing Provider  adapalene (DIFFERIN) 0.1 % cream Apply topically at bedtime. 03/05/20   Deirdre Evener, MD  ARIPiprazole (ABILIFY) 15 MG tablet Take by mouth. 05/29/19   [provider]  clindamycin (CLEOCIN-T) 1 % lotion Apply topically in the morning and at bedtime. 07/16/20 07/16/21  Deirdre Evener, MD  clonazePAM (KLONOPIN) 1 MG tablet Take 1 tablet by mouth 2 (two) times daily. 02/28/19   [provider]  cyclobenzaprine (FLEXERIL) 10 MG tablet Take 1 tablet (10 mg total) by mouth 3 (three) times daily as needed for muscle spasms. 04/10/20   Malfi, Jodelle Gross, FNP  hydrOXYzine (ATARAX/VISTARIL) 50 MG tablet Take 100 mg by mouth daily.  03/30/19   [provider]  levonorgestrel (MIRENA) 20 MCG/24HR IUD 1 each by Intrauterine route once.    [provider]  mometasone (ELOCON) 0.1 % cream Apply 1 application topically daily as needed (Rash). 03/05/20   Deirdre Evener, MD  mupirocin ointment (BACTROBAN) 2 % Apply 1 application topically 2 (two) times daily. For 7-10 days until healed. 08/18/20   Karamalegos, Netta Neat, DO  naproxen (NAPROSYN) 500 MG tablet Take 1 tablet (500 mg total) by mouth 2 (two) times daily with a meal. 11/12/19   Malfi, Jodelle Gross, FNP  Sulfacetamide Sodium-Sulfur (SULFACLEANSE 8/4) 8-4 % SUSP Apply to skin daily 07/16/20   Deirdre Evener, MD  Allergies Fish allergy    Social History Social History   Tobacco Use  . Smoking status: Current Every Day Smoker    Packs/day: 0.50    Years: 10.00    Pack years: 5.00    Types: Cigarettes  . Smokeless tobacco: Never Used  Vaping Use  . Vaping Use: Former  . Substances: THC  Substance Use Topics  . Alcohol use: Yes    Alcohol/week: 3.0 standard drinks    Types: 3 Cans of beer per week    Comment: 3 beers weekly  . Drug use: Yes    Types: Marijuana    Review of Systems Patient  denies headaches, rhinorrhea, blurry vision, numbness, shortness of breath, chest pain, edema, cough, abdominal pain, nausea, vomiting, diarrhea, dysuria, fevers, rashes or hallucinations unless otherwise stated above in HPI. ____________________________________________   PHYSICAL EXAM:  VITAL SIGNS: Vitals:   10/27/20 0123  BP: 125/87  Pulse: 65  Resp: 16  Temp: 98.1 F (36.7 C)  SpO2: 99%    Constitutional: Alert and oriented.  Eyes: Conjunctivae are normal.  Head: Atraumatic. Nose: No congestion/rhinnorhea. Mouth/Throat: Mucous membranes are moist.   Neck: No stridor. Painless ROM.  Cardiovascular: Normal rate, regular rhythm. Grossly normal heart sounds.  Good peripheral circulation. Respiratory: Normal respiratory effort.  No retractions. Lungs CTAB. Gastrointestinal: Soft and nontender. No distention. No abdominal bruits. No CVA tenderness. Genitourinary:  Musculoskeletal: No lower extremity tenderness nor edema.  No joint effusions. Neurologic:  CN- intact.  No facial droop, Normal FNF.  Sensation intact bilaterally. Normal speech and language. No gross focal neurologic deficits are appreciated. No gait instability. Skin:  Skin is warm, dry and intact. No rash noted. Psychiatric: Mood and affect are normal. Speech and behavior are normal.  ____________________________________________   LABS (all labs ordered are listed, but only abnormal results are displayed)  No results found for this or any previous visit (from the past 24 hour(s)). ____________________________________________ ____________________________________________  RADIOLOGY  I personally reviewed all radiographic images ordered to evaluate for the above acute complaints and reviewed radiology reports and findings.  These findings were personally discussed with the patient.  Please see medical record for radiology report.  ____________________________________________   PROCEDURES  Procedure(s)  performed:  Procedures    Critical Care performed: no ____________________________________________   INITIAL IMPRESSION / ASSESSMENT AND PLAN / ED COURSE  Pertinent labs & imaging results that were available during my care of the patient were reviewed by me and considered in my medical decision making (see chart for details).   DDX: migraine, tension, cluster, sinusitis, mening, sah  Keshawna Shanequa Elbe is a 30 y.o. who presents to the ED with with Hx of migrainses p/w HA for last 24hr. Not worst HA ever. Gradual onset. HA similar to previous episodes. Denies focal neurologic symptoms. Denies trauma. No fevers or neck pain. No vision loss. Afebrile in ED. VSS. Exam as above. No meningeal signs. No CN, motor, sensory or cerebellar deficits. Temporal arteries palpable and non-tender. Appears well and non-toxic.  Will provide IV fluids for hydration and IV medications for symptom control.     Likely tension, non-specific or possible migraine HA. Clinical picture is not consistent with ICH, SAH, SDH, EDH, TIA, or CVA. No concern for meningitis or encephalitis. No concern for GCA/Temporal arteritis.  Pain improved. Repeat neuro exam is again without focal deficit, nuchal rigidity or evidence of meningeal irritation.  Stable to D/C home, follow up with PCP or Neurology if persistent recurrent Has.  Have discussed  with the patient and available family all diagnostics and treatments performed thus far and all questions were answered to the best of my ability. The patient demonstrates understanding and agreement with plan.    Clinical Course as of 10/27/20 0307  Mon Oct 27, 2020  0246 Patient feeling significantly improved.  Will give Decadron as well help reduce likelihood of rebound headache.  This point believe she stable appropriate for outpatient follow-up.  Patient agrees to plan. [PR]    Clinical Course User Index [PR] Willy Eddy, MD    The patient was evaluated in Emergency  Department today for the symptoms described in the history of present illness. He/she was evaluated in the context of the global COVID-19 pandemic, which necessitated consideration that the patient might be at risk for infection with the SARS-CoV-2 virus that causes COVID-19. Institutional protocols and algorithms that pertain to the evaluation of patients at risk for COVID-19 are in a state of rapid change based on information released by regulatory bodies including the CDC and federal and state organizations. These policies and algorithms were followed during the patient's care in the ED.  As part of my medical decision making, I reviewed the following data within the electronic MEDICAL RECORD NUMBER Nursing notes reviewed and incorporated, Labs reviewed, notes from prior ED visits and [place] Controlled Substance Database   ____________________________________________   FINAL CLINICAL IMPRESSION(S) / ED DIAGNOSES  Final diagnoses:  Bad headache      NEW MEDICATIONS STARTED DURING THIS VISIT:  New Prescriptions   No medications on file     Note:  This document was prepared using Dragon voice recognition software and may include unintentional dictation errors.    Willy Eddy, MD 10/27/20 [phone]

## 2020-10-27 NOTE — ED Triage Notes (Signed)
Pt reports headache since yesterday, states "I've had migraines in the past but it's been years." Denies emesis/diarrhea. A&Ox4, no numbness/focal deficits. Reports last Ibuprofen 600mg  1hr PTA. Denies vision changes/nausea.

## 2020-10-27 NOTE — ED Notes (Signed)
ED Provider at bedside. 

## 2020-10-27 NOTE — ED Triage Notes (Signed)
Pt was seen in the ER this am for headache, treated with meds and reported to be better on discharge, pt went home to bed and when she woke up this am her headache returned

## 2020-10-27 NOTE — ED Provider Notes (Signed)
Patient not in room when I went to go see her, called her on the phone, she notes that she was able to get in touch with her primary care provider and will be going to see them today   Jene Every, MD 10/27/20 [phone]

## 2020-10-27 NOTE — Discharge Instructions (Addendum)

## 2020-10-28 ENCOUNTER — Encounter: Payer: Self-pay | Admitting: Internal Medicine

## 2020-10-28 ENCOUNTER — Ambulatory Visit (INDEPENDENT_AMBULATORY_CARE_PROVIDER_SITE_OTHER): Payer: Medicare Other | Admitting: Internal Medicine

## 2020-10-28 VITALS — BP 107/66 | HR 64 | Temp 98.4°F | Resp 18 | Ht 60.0 in | Wt 175.8 lb

## 2020-10-28 DIAGNOSIS — G43009 Migraine without aura, not intractable, without status migrainosus: Secondary | ICD-10-CM | POA: Diagnosis not present

## 2020-10-28 MED ORDER — BUTALBITAL-APAP-CAFFEINE 50-325-40 MG PO TABS: 1.0000 | ORAL_TABLET | Freq: Four times a day (QID) | ORAL | 0 refills | Status: DC | PRN

## 2020-10-28 NOTE — Progress Notes (Signed)
Subjective:    Patient ID: Ellen Kaiser, female    DOB: 07-28-1990, 30 y.o.   MRN: [id number]  HPI  Pt presents to the clinic today for ED follow up. She went to the ER 5/9 with c/o headache that had started 1 day prior with associated sensitivity to light and sound.  She has a history of migraines and reports this felt similar.  She was given a liter of IV fluids, Tylenol, Benadryl, dexamethasone and Prochlorperazine with significant improvement in her symptoms.  She was discharged and advised to follow-up with her PCP.  She ended up going back to the ER at 8 AM for the same complaint but eloped prior to being seen. Since discharge, she reports the headache has improved. She had been taking Ibuprofen OTC with complete resolution of symptoms at this point.   Review of Systems  Past Medical History:  Diagnosis Date  . Allergy   . Anxiety   . Asthma   . Bipolar 1 disorder (HCC)   . BV (bacterial vaginosis)   . Depression   . HSV-2 seropositive     Current Outpatient Medications  Medication Sig Dispense Refill  . adapalene (DIFFERIN) 0.1 % cream Apply topically at bedtime. 45 g 3  . ARIPiprazole (ABILIFY) 15 MG tablet Take by mouth.    . clindamycin (CLEOCIN-T) 1 % lotion Apply topically in the morning and at bedtime. 60 mL 1  . clonazePAM (KLONOPIN) 1 MG tablet Take 1 tablet by mouth 2 (two) times daily.    . cyclobenzaprine (FLEXERIL) 10 MG tablet Take 1 tablet (10 mg total) by mouth 3 (three) times daily as needed for muscle spasms. 30 tablet 0  . hydrOXYzine (ATARAX/VISTARIL) 50 MG tablet Take 100 mg by mouth daily.     Marland Kitchen levonorgestrel (MIRENA) 20 MCG/24HR IUD 1 each by Intrauterine route once.    . mometasone (ELOCON) 0.1 % cream Apply 1 application topically daily as needed (Rash). 45 g 11  . mupirocin ointment (BACTROBAN) 2 % Apply 1 application topically 2 (two) times daily. For 7-10 days until healed. 22 g 0  . naproxen (NAPROSYN) 500 MG tablet Take 1 tablet (500 mg  total) by mouth 2 (two) times daily with a meal. 30 tablet 0  . Sulfacetamide Sodium-Sulfur (SULFACLEANSE 8/4) 8-4 % SUSP Apply to skin daily 473 mL 1   No current facility-administered medications for this visit.    Allergies  Allergen Reactions  . Fish Allergy Anaphylaxis and Swelling    Family History  Problem Relation Age of Onset  . Depression Mother   . Alcohol abuse Mother   . Bipolar disorder Mother     Social History   Socioeconomic History  . Marital status: Single    Spouse name: Not on file  . Number of children: Not on file  . Years of education: Not on file  . Highest education level: Not on file  Occupational History  . Occupation: disabled    Comment: PTSD  Tobacco Use  . Smoking status: Current Every Day Smoker    Packs/day: 0.50    Years: 10.00    Pack years: 5.00    Types: Cigarettes  . Smokeless tobacco: Never Used  Vaping Use  . Vaping Use: Former  . Substances: THC  Substance and Sexual Activity  . Alcohol use: Yes    Alcohol/week: 3.0 standard drinks    Types: 3 Cans of beer per week    Comment: 3 beers weekly  . Drug  use: Yes    Types: Marijuana  . Sexual activity: Yes    Partners: Male    Birth control/protection: I.U.D.    Comment: Mirena  Other Topics Concern  . Not on file  Social History Narrative  . Not on file   Social Determinants of Health   Financial Resource Strain: Not on file  Food Insecurity: Not on file  Transportation Needs: Not on file  Physical Activity: Not on file  Stress: Not on file  Social Connections: Not on file  Intimate Partner Violence: Not on file     Constitutional: Denies fever, malaise, fatigue, headache or abrupt weight changes.  HEENT: Denies eye pain, eye redness, ear pain, ringing in the ears, wax buildup, runny nose, nasal congestion, bloody nose, or sore throat. Respiratory: Denies difficulty breathing, shortness of breath, cough or sputum production.   Cardiovascular: Denies chest  pain, chest tightness, palpitations or swelling in the hands or feet.  Neurological: Denies dizziness, difficulty with memory, difficulty with speech or problems with balance and coordination.    No other specific complaints in a complete review of systems (except as listed in HPI above).     Objective:   Physical Exam BP 107/66 (BP Location: Left Arm, Patient Position: Sitting, Cuff Size: Normal)   Pulse 64   Temp 98.4 F (36.9 C) (Oral)   Resp 18   Ht 5' (1.524 m)   Wt 175 lb 12.8 oz (79.7 kg)   SpO2 96%   BMI 34.33 kg/m   Wt Readings from Last 3 Encounters:  10/27/20 174 lb (78.9 kg)  10/27/20 174 lb (78.9 kg)  08/18/20 169 lb 3.2 oz (76.7 kg)    General: Appears her stated age, obese, in NAD. HEENT: Head: normal shape and size; Eyes: sclera white PERRLA and EOMs intact; Cardiovascular: Normal rate and rhythm. S1,S2 noted.  No murmur, rubs or gallops noted.  Pulmonary/Chest: Normal effort and positive vesicular breath sounds. No respiratory distress. No wheezes, rales or ronchi noted.  Neurological: Alert and oriented. Coordination normal.   BMET    Component Value Date/Time   NA 139 05/26/2020 0947   K 4.3 05/26/2020 0947   CL 107 05/26/2020 0947   CO2 22 05/26/2020 0947   GLUCOSE 81 05/26/2020 0947   BUN 11 05/26/2020 0947   CREATININE 0.95 05/26/2020 0947   CALCIUM 9.8 05/26/2020 0947   GFRNONAA 81 05/26/2020 0947   GFRAA 94 05/26/2020 0947    Lipid Panel  No results found for: CHOL, TRIG, HDL, CHOLHDL, VLDL, LDLCALC  CBC    Component Value Date/Time   WBC 4.9 05/26/2020 0947   RBC 4.28 05/26/2020 0947   HGB 14.6 05/26/2020 0947   HCT 43.2 05/26/2020 0947   PLT 258 05/26/2020 0947   MCV 100.9 (H) 05/26/2020 0947   MCH 34.1 (H) 05/26/2020 0947   MCHC 33.8 05/26/2020 0947   RDW 12.1 05/26/2020 0947   LYMPHSABS 2,141 05/26/2020 0947   EOSABS 20 05/26/2020 0947   BASOSABS 29 05/26/2020 0947    Hgb A1C No results found for: HGBA1C            Assessment & Plan:   ED Follow up for Migraine without Aure, Not Intractable:  ER notes reviewed Headache has subsided at this time Encouraged her to keep a headache diary to try to identify triggers RX for Fioricet Q6H prn  She already has an appt with [facility] 11/07/20 for further evaluation of symptoms   Return precautions discussed Nicki Reaper,  NP This visit occurred during the SARS-CoV-2 public health emergency.  Safety protocols were in place, including screening questions prior to the visit, additional usage of staff PPE, and extensive cleaning of exam room while observing appropriate contact time as indicated for disinfecting solutions.

## 2020-10-28 NOTE — Patient Instructions (Signed)
Form - Headache Record  There are many types and causes of headaches. A headache record can help guide your treatment plan. Use this form to record the details. Bring this form with you to your follow-up visits.  Follow your health care provider's instructions on how to describe your headache. You may be asked to:  · Use a pain scale. This is a tool to rate the intensity of your headache using words or numbers.  · Describe what your headache feels like, such as dull, achy, throbbing, or sharp.  Headache record  Date: _______________ Time (from start to end): ____________________ Location of the headache: _________________________  · Intensity of the headache: ____________________ Description of the headache: ______________________________________________________________  · Hours of sleep the night before the headache: __________  · Food or drinks before the headache started: ______________________________________________________________________________________  · Events before the headache started: _______________________________________________________________________________________________  · Symptoms before the headache started: __________________________________________________________________________________________  · Symptoms during the headache: __________________________________________________________________________________________________  · Treatment: ________________________________________________________________________________________________________________  · Effect of treatment: _________________________________________________________________________________________________________  · Other comments: ___________________________________________________________________________________________________________  Date: _______________ Time (from start to end): ____________________ Location of the headache: _________________________  · Intensity of the headache: ____________________ Description of the  headache: ______________________________________________________________  · Hours of sleep the night before the headache: __________  · Food or drinks before the headache started: ______________________________________________________________________________________  · Events before the headache started: ____________________________________________________________________________________________  · Symptoms before the headache started: _________________________________________________________________________________________  · Symptoms during the headache: _______________________________________________________________________________________________  · Treatment: ________________________________________________________________________________________________________________  · Effect of treatment: _________________________________________________________________________________________________________  · Other comments: ___________________________________________________________________________________________________________  Date: _______________ Time (from start to end): ____________________ Location of the headache: _________________________  · Intensity of the headache: ____________________ Description of the headache: ______________________________________________________________  · Hours of sleep the night before the headache: __________  · Food or drinks before the headache started: ______________________________________________________________________________________  · Events before the headache started: ____________________________________________________________________________________________  · Symptoms before the headache started: _________________________________________________________________________________________  · Symptoms during the headache: _______________________________________________________________________________________________  · Treatment:  ________________________________________________________________________________________________________________  · Effect of treatment: _________________________________________________________________________________________________________  · Other comments: ___________________________________________________________________________________________________________  Date: _______________ Time (from start to end): ____________________ Location of the headache: _________________________  · Intensity of the headache: ____________________ Description of the headache: ______________________________________________________________  · Hours of sleep the night before the headache: _________  · Food or drinks before the headache started: ______________________________________________________________________________________  · Events before the headache started: ____________________________________________________________________________________________  · Symptoms before the headache started: _________________________________________________________________________________________  · Symptoms during the headache: _______________________________________________________________________________________________  · Treatment: ________________________________________________________________________________________________________________  · Effect of treatment: _________________________________________________________________________________________________________  · Other comments: ___________________________________________________________________________________________________________  Date: _______________ Time (from start to end): ____________________ Location of the headache: _________________________  · Intensity of the headache: ____________________ Description of the headache: ______________________________________________________________  · Hours of sleep the night before the headache: _________  · Food or drinks  before the headache started: ______________________________________________________________________________________  · Events before the headache started: ____________________________________________________________________________________________  · Symptoms before the headache started: _________________________________________________________________________________________  · Symptoms during the headache: _______________________________________________________________________________________________  · Treatment: ________________________________________________________________________________________________________________  · Effect of treatment: _________________________________________________________________________________________________________  · Other comments: ___________________________________________________________________________________________________________  This information is not intended to replace advice given to you by your health care provider. Make sure you discuss any questions you have with your health care provider.  Document Revised: 06/26/2018 Document Reviewed: 06/26/2018  Elsevier Patient Education © 2021 [employer]

## 2020-10-31 ENCOUNTER — Ambulatory Visit: Payer: Medicare Other | Admitting: Licensed Clinical Social Worker

## 2020-10-31 ENCOUNTER — Telehealth: Payer: Self-pay

## 2020-10-31 ENCOUNTER — Other Ambulatory Visit: Payer: Self-pay

## 2020-10-31 NOTE — Progress Notes (Signed)
appt cancelled due to pt having migraine headache.

## 2020-10-31 NOTE — Telephone Encounter (Signed)
Ok for them to evaluate her for headaches

## 2020-10-31 NOTE — Telephone Encounter (Signed)
Copied from CRM [id number]. Topic: General - Other >> Oct 31, 2020  8:58 AM Gaetana Michaelis A wrote: Reason for CRM: David Stall with [facility] has made contact requesting approval from [facility]. Baity to see patient for headaches  Patient has recently been hospitalized at [facility] for concerns related to headaches and has contacted [facility] for further care  Patient was scheduled to be seen 11/07/20 but could be seen today 10/31/20 with approval from [place]. Baity  Please contact to further advise when possible >> Oct 31, 2020  9:08 AM Gwenlyn Fudge wrote: Pt called stating that she is needing to have this referral. She states that her appt with neurology is on 11/07/20. Pt states that she is in a lot of pain and is very upset that this has not already been done. Please advise.

## 2020-11-03 ENCOUNTER — Ambulatory Visit
Admission: RE | Admit: 2020-11-03 | Discharge: 2020-11-03 | Disposition: A | Discharge status: Home / Self Care | Payer: Medicare Other | Source: Ambulatory Visit | Attending: Internal Medicine | Admitting: Internal Medicine

## 2020-11-03 ENCOUNTER — Encounter: Payer: Self-pay | Admitting: Internal Medicine

## 2020-11-03 ENCOUNTER — Telehealth: Payer: Self-pay | Admitting: Licensed Clinical Social Worker

## 2020-11-03 ENCOUNTER — Other Ambulatory Visit (HOSPITAL_COMMUNITY)
Admission: RE | Admit: 2020-11-03 | Discharge: 2020-11-03 | Disposition: A | Discharge status: Home / Self Care | Payer: Medicare Other | Source: Ambulatory Visit | Attending: Internal Medicine | Admitting: Internal Medicine

## 2020-11-03 ENCOUNTER — Ambulatory Visit
Admission: RE | Admit: 2020-11-03 | Discharge: 2020-11-03 | Disposition: A | Discharge status: Home / Self Care | Payer: Medicare Other | Attending: Internal Medicine | Admitting: Internal Medicine

## 2020-11-03 ENCOUNTER — Other Ambulatory Visit: Payer: Self-pay

## 2020-11-03 ENCOUNTER — Ambulatory Visit (INDEPENDENT_AMBULATORY_CARE_PROVIDER_SITE_OTHER): Payer: Medicare Other | Admitting: Internal Medicine

## 2020-11-03 VITALS — BP 114/65 | HR 73 | Temp 97.7°F | Wt 172.0 lb

## 2020-11-03 DIAGNOSIS — N898 Other specified noninflammatory disorders of vagina: Secondary | ICD-10-CM | POA: Insufficient documentation

## 2020-11-03 DIAGNOSIS — G8929 Other chronic pain: Secondary | ICD-10-CM

## 2020-11-03 DIAGNOSIS — M25562 Pain in left knee: Secondary | ICD-10-CM | POA: Insufficient documentation

## 2020-11-03 DIAGNOSIS — G43009 Migraine without aura, not intractable, without status migrainosus: Secondary | ICD-10-CM

## 2020-11-03 DIAGNOSIS — E6609 Other obesity due to excess calories: Secondary | ICD-10-CM

## 2020-11-03 DIAGNOSIS — M542 Cervicalgia: Secondary | ICD-10-CM | POA: Diagnosis not present

## 2020-11-03 DIAGNOSIS — Z6833 Body mass index (BMI) 33.0-33.9, adult: Secondary | ICD-10-CM

## 2020-11-03 MED ORDER — CYCLOBENZAPRINE HCL 10 MG PO TABS: 10.0000 mg | ORAL_TABLET | Freq: Three times a day (TID) | ORAL | 0 refills | Status: DC | PRN

## 2020-11-03 MED ORDER — NAPROXEN 500 MG PO TABS: 500.0000 mg | ORAL_TABLET | Freq: Two times a day (BID) | ORAL | 0 refills | Status: DC

## 2020-11-03 MED ORDER — BUTALBITAL-APAP-CAFFEINE 50-325-40 MG PO TABS: 1.0000 | ORAL_TABLET | Freq: Four times a day (QID) | ORAL | 0 refills | Status: DC | PRN
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
## 2020-11-03 NOTE — Telephone Encounter (Signed)
Pt on call list--scheduled appt 11/03/2020.  Weyman Pedro, MSW, LCSW Outpatient Therapist/Triage Specialist

## 2020-11-03 NOTE — Assessment & Plan Note (Signed)
Uncontrolled Will obtain xray cervical spine to see if neck pain is a contributing factor Fioricet refilled She has an appt with neurology scheduled She is keeping a headache diary

## 2020-11-03 NOTE — Patient Instructions (Signed)
Vaginitis  Vaginitis is irritation and swelling of the vagina. Treatment will depend on the cause. What are the causes? It can be caused by:  Bacteria.  Yeast.  A parasite.  A virus.  Low hormone levels.  Bubble baths, scented tampons, and feminine sprays. Other things can change the balance of the yeast and bacteria that live in the vagina. These include:  Antibiotic medicines.  Not being clean enough.  Some birth control methods.  Sex.  Infection.  Diabetes.  A weakened body defense system (immune system). What increases the risk?  Smoking or being around someone who smokes.  Using washes (douches), scented tampons, or scented pads.  Wearing tight pants or thong underwear.  Using birth control pills or an IUD.  Having sex without a condom or having a lot of partners.  Having an STI.  Using a certain product to kill sperm (nonoxynol-9).  Eating foods that are high in sugar.  Having diabetes.  Having low levels of a female hormone.  Having a weakened body defense system.  Being pregnant or breastfeeding. What are the signs or symptoms?  Fluid coming from the vagina that is not normal.  A bad smell.  Itching, pain, or swelling.  Pain with sex.  Pain or burning when you pee (urinate). Sometimes there are no symptoms. How is this treated? Treatment may include:  Antibiotic creams or pills.  Antifungal medicines.  Medicines to ease symptoms if you have a virus. Your sex partner should also be treated.  Estrogen medicines.  Avoiding scented soaps, sprays, or douches.  Stopping use of products that caused irritation and then using a cream to treat symptoms. Follow these instructions at home: Lifestyle  Keep the area around your vagina clean and dry. ? Avoid using soap. ? Rinse the area with water.  Until your doctor says it is okay: ? Do not use washes for the vagina. ? Do not use tampons. ? Do not have sex.  Wipe from front to  back after going to the bathroom.  When your doctor says it is okay, practice safe sex and use condoms. General instructions  Take over-the-counter and prescription medicines only as told by your doctor.  If you were prescribed an antibiotic medicine, take or use it as told by your doctor. Do not stop taking or using it even if you start to feel better.  Keep all follow-up visits. How is this prevented?  Do not use things that can irritate the vagina, such as fabric softeners. Avoid these products if they are scented: ? Sprays. ? Detergents. ? Tampons. ? Products for cleaning the vagina. ? Soaps or bubble baths.  Let air reach your vagina. To do this: ? Wear cotton underwear. ? Do not wear:  Underwear while you sleep.  Tight pants.  Thong underwear.  Underwear or nylons without a cotton panel. ? Take off any wet clothing, such as bathing suits, as soon as you can. ? Practice safe sex and use condoms. Contact a doctor if:  You have pain in your belly or in the area between your hips.  You have a fever or chills.  Your symptoms last for more than 2-3 days. Get help right away if:  You have a fever and your symptoms get worse all of a sudden. Summary  Vaginitis is irritation and swelling of the vagina.  Treatment will depend on the cause of the condition.  Do not use washes or tampons or have sex until your doctor says it   is okay. This information is not intended to replace advice given to you by your health care provider. Make sure you discuss any questions you have with your health care provider. Document Revised: 12/06/2019 Document Reviewed: 12/06/2019 Elsevier Patient Education  2021 Elsevier Inc.  

## 2020-11-04 ENCOUNTER — Ambulatory Visit (INDEPENDENT_AMBULATORY_CARE_PROVIDER_SITE_OTHER): Payer: Medicare Other | Admitting: Licensed Clinical Social Worker

## 2020-11-04 DIAGNOSIS — F319 Bipolar disorder, unspecified: Secondary | ICD-10-CM | POA: Diagnosis not present

## 2020-11-04 NOTE — Telephone Encounter (Signed)
I called and left a message on [place] and left a verbal on voicemail given permission to see the patient for headaches per [place].

## 2020-11-04 NOTE — Progress Notes (Signed)
Virtual Visit via Video Note  I connected with Ellen Kaiser on 11/04/20 at  2:00 PM EDT by a video enabled telemedicine application and verified that I am speaking with the correct person using two identifiers.  Location: Patient: home Provider: remote office [place], [place])   I discussed the limitations of evaluation and management by telemedicine and the availability of in person appointments. The patient expressed understanding and agreed to proceed.  I discussed the assessment and treatment plan with the patient. The patient was provided an opportunity to ask questions and all were answered. The patient agreed with the plan and demonstrated an understanding of the instructions.   The patient was advised to call back or seek an in-person evaluation if the symptoms worsen or if the condition fails to improve as anticipated.  I provided 45 minutes of non-face-to-face time during this encounter.   Augusta Hilbert R Nihaal Friesen, LCSW   THERAPIST PROGRESS NOTE  Session Time: 2-245p  Participation Level: Active  Behavioral Response: Guarded and NeatAlertAnxious and Irritable  Type of Therapy: Individual Therapy  Treatment Goals addressed: Anxiety and Coping  Interventions: Solution Focused and Strength-based  Summary: Ellen Kaiser is a 30 y.o. female who presents with continuing symptoms related to mood disorder. Pt does report mood fluctuations depending on situation. Pt reports anxiety/stress fluctuations depending on external stressors.   Allowed pt safe space to explore thoughts and feelings about recent external stressors. Pt reports that she has some irritability about not getting a job--discussed the interview that she had going in. Also discussed her decision to leave a job that she had "I worked there for three shifts". Discussed the incident in where her supervisor shared with her "the eye in the sky caught you being not-so-kind to others".  Pt explained the  situation and how she felt that she was in the right and ended up walking out.  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 Pt is currently attending classes for summer session and is happy about this decision.  Pts relationship with mother still rocky--pt is working hard to set boundaries with mother and sister. Pt feels that setting the boundaries makes her feel more empowered and that she is not engrossed in a toxic situation.  Continued recommendations are as follows: self care behaviors, positive social engagements, focusing on overall work/home/life balance, and focusing on positive physical and emotional wellness.   Suicidal/Homicidal: No  Therapist Response: Ellen Kaiser is able to identify triggers and utilize coping mechanisms to manage anxiety in the moment. Ellen Kaiser is able to implement appropriate relaxation and diversion activities to decrease overall levels of anxiety. Ellen Kaiser is continuing to develop skills that will allow her to engage socially more with peers. These behaviors are reflective of personal growth and progress. Treatment to continue  Plan: Return again in 4 weeks.  Diagnosis: Axis I: Bipolar, mixed    Axis II: No diagnosis    Ernest Haber Hilding Quintanar, LCSW 11/04/2020

## 2020-11-05 LAB — CERVICOVAGINAL ANCILLARY ONLY
Chlamydia: NEGATIVE
Comment: NEGATIVE
Comment: NORMAL
Neisseria Gonorrhea: NEGATIVE

## 2020-11-28 ENCOUNTER — Ambulatory Visit: Payer: Medicare Other | Admitting: Licensed Clinical Social Worker

## 2020-12-12 ENCOUNTER — Ambulatory Visit (INDEPENDENT_AMBULATORY_CARE_PROVIDER_SITE_OTHER): Payer: Medicare Other | Admitting: Licensed Clinical Social Worker

## 2020-12-12 ENCOUNTER — Other Ambulatory Visit: Payer: Self-pay

## 2020-12-12 DIAGNOSIS — F319 Bipolar disorder, unspecified: Secondary | ICD-10-CM | POA: Diagnosis not present

## 2020-12-12 NOTE — Progress Notes (Signed)
Virtual Visit via Video Note  I connected with Ellen Kaiser on 12/12/20 at 11:00 AM EDT by a video enabled telemedicine application and verified that I am speaking with the correct person using two identifiers.  Location: Patient: home Provider: remote office [place], [place])   I discussed the limitations of evaluation and management by telemedicine and the availability of in person appointments. The patient expressed understanding and agreed to proceed.   I discussed the assessment and treatment plan with the patient. The patient was provided an opportunity to ask questions and all were answered. The patient agreed with the plan and demonstrated an understanding of the instructions.   The patient was advised to call back or seek an in-person evaluation if the symptoms worsen or if the condition fails to improve as anticipated.  I provided 30 minutes of non-face-to-face time during this encounter.   Abrielle Finck R Bevin Das, LCSW   THERAPIST PROGRESS NOTE  Session Time: 11-11:30a  Participation Level: Active  Behavioral Response: Neat and Well GroomedAlertAnxious  Type of Therapy: Individual Therapy  Treatment Goals addressed: Coping  Interventions: Strength-based and Supportive  Summary: Ellen Kaiser is a 30 y.o. female who presents with improving symptoms related to bipolar disorder. Patient reports that she is doing well. Patient feels her overall mood is stable and that she is managing stressful situations better. Discussed emotion regulation, and patient feels like she is able to regulate her emotions better. Patient reports good quality and quantity of sleep.   Patient was excited that she in roommate we're going to move at one point, but the move fell through and they decided to stay where they are. Patient was disappointed at first, but figures that it just wasn't meant to be at this point in time.    Discussed patients relationships with friends and  family, and patient reports that her relationships are going well. Patient is continuing with school this summer, and is struggling in the Spanish class. Patient is continuing to do well in public speaking class, and is ready to be done in July.   Reviewed importance of self care and life balance and patient feels that she's doing a good job with this and dedicates every Sunday for herself care. Patient does not report any significant external stressors at time of session, and the goal is to maintain current levels of progress.   Continued recommendations are as follows: self care behaviors, positive social engagements, focusing on overall work/home/life balance, and focusing on positive physical and emotional wellness.     Suicidal/Homicidal: No  Therapist Response: Ellen Kaiser is able to identify triggers and utilize coping mechanisms to manage anxiety in the moment. Ellen Kaiser is able to implement appropriate relaxation and diversion activities to decrease overall levels of anxiety. Ellen Kaiser is continuing to develop skills that will allow her to engage socially more with peers. These behaviors are reflective of personal growth and progress. Treatment to continue  Plan: Return again in 4 weeks.  Diagnosis: Axis I: Bipolar 1 disorder    Axis II: No diagnosis    Ernest Haber Myer Bohlman, LCSW 12/12/2020

## 2020-12-29 ENCOUNTER — Encounter: Payer: Self-pay | Admitting: Internal Medicine

## 2020-12-29 ENCOUNTER — Ambulatory Visit (INDEPENDENT_AMBULATORY_CARE_PROVIDER_SITE_OTHER): Payer: Medicare Other | Admitting: Internal Medicine

## 2020-12-29 ENCOUNTER — Other Ambulatory Visit: Payer: Self-pay

## 2020-12-29 ENCOUNTER — Other Ambulatory Visit (HOSPITAL_COMMUNITY)
Admission: RE | Admit: 2020-12-29 | Discharge: 2020-12-29 | Disposition: A | Discharge status: Home / Self Care | Payer: Medicare Other | Source: Ambulatory Visit | Attending: Internal Medicine | Admitting: Internal Medicine

## 2020-12-29 VITALS — BP 99/57 | HR 57 | Temp 98.4°F | Resp 17 | Ht 60.0 in | Wt 174.1 lb

## 2020-12-29 DIAGNOSIS — Z113 Encounter for screening for infections with a predominantly sexual mode of transmission: Secondary | ICD-10-CM | POA: Insufficient documentation

## 2020-12-29 DIAGNOSIS — L0231 Cutaneous abscess of buttock: Secondary | ICD-10-CM | POA: Diagnosis not present

## 2020-12-29 DIAGNOSIS — Z7251 High risk heterosexual behavior: Secondary | ICD-10-CM | POA: Diagnosis not present

## 2020-12-29 DIAGNOSIS — Z6834 Body mass index (BMI) 34.0-34.9, adult: Secondary | ICD-10-CM | POA: Diagnosis not present

## 2020-12-29 DIAGNOSIS — E6609 Other obesity due to excess calories: Secondary | ICD-10-CM

## 2020-12-29 DIAGNOSIS — Z112 Encounter for screening for other bacterial diseases: Secondary | ICD-10-CM | POA: Insufficient documentation

## 2020-12-29 DIAGNOSIS — Z202 Contact with and (suspected) exposure to infections with a predominantly sexual mode of transmission: Secondary | ICD-10-CM | POA: Diagnosis present

## 2020-12-29 DIAGNOSIS — Z975 Presence of (intrauterine) contraceptive device: Secondary | ICD-10-CM | POA: Diagnosis not present

## 2020-12-29 MED ORDER — CEPHALEXIN 500 MG PO CAPS: 500.0000 mg | ORAL_CAPSULE | Freq: Three times a day (TID) | ORAL | 0 refills | Status: DC

## 2020-12-29 NOTE — Patient Instructions (Signed)
Skin Abscess ?A skin abscess is an infected area of your skin that contains pus and other material. An abscess can happen in any part of your body. Some abscesses break open (rupture) on their own. Most continue to get worse unless they are treated. The infection can spread deeper into the body and into your blood, which can make you feel sick. ?A skin abscess is caused by germs that enter the skin through a cut or scrape. It can also be caused by blocked oil and sweat glands or infected hair follicles. ?This condition is usually treated by: ?Draining the pus. ?Taking antibiotic medicines. ?Placing a warm, wet washcloth over the abscess. ?Follow these instructions at home: ?Medicines ? ?Take over-the-counter and prescription medicines only as told by your doctor. ?If you were prescribed an antibiotic medicine, take it as told by your doctor. Do not stop taking the antibiotic even if you start to feel better. ?Abscess care ? ?If you have an abscess that has not drained, place a warm, clean, wet washcloth over the abscess several times a day. Do this as told by your doctor. ?Follow instructions from your doctor about how to take care of your abscess. Make sure you: ?Cover the abscess with a bandage (dressing). ?Change your bandage or gauze as told by your doctor. ?Wash your hands with soap and water before you change the bandage or gauze. If you cannot use soap and water, use hand sanitizer. ?Check your abscess every day for signs that the infection is getting worse. Check for: ?More redness, swelling, or pain. ?More fluid or blood. ?Warmth. ?More pus or a bad smell. ?General instructions ?To avoid spreading the infection: ?Do not share personal care items, towels, or hot tubs with others. ?Avoid making skin-to-skin contact with other people. ?Keep all follow-up visits as told by your doctor. This is important. ?Contact a doctor if: ?You have more redness, swelling, or pain around your abscess. ?You have more fluid or  blood coming from your abscess. ?Your abscess feels warm when you touch it. ?You have more pus or a bad smell coming from your abscess. ?You have a fever. ?Your muscles ache. ?You have chills. ?You feel sick. ?Get help right away if: ?You have very bad (severe) pain. ?You see red streaks on your skin spreading away from the abscess. ?Summary ?A skin abscess is an infected area of your skin that contains pus and other material. ?The abscess is caused by germs that enter the skin through a cut or scrape. It can also be caused by blocked oil and sweat glands or infected hair follicles. ?Follow your doctor's instructions on caring for your abscess, taking medicines, preventing infections, and keeping follow-up visits. ?This information is not intended to replace advice given to you by your health care provider. Make sure you discuss any questions you have with your health care provider. ?Document Revised: 01/11/2019 Document Reviewed: 07/21/2017 ?Elsevier Patient Education ? 2022 Elsevier Inc. ? ?

## 2020-12-29 NOTE — Progress Notes (Signed)
Subjective:    Patient ID: Ellen Kaiser, female    DOB: 22-Jul-1990, 30 y.o.   MRN: [id number]  HPI  Pt presents to the clinic today with c/o bumps on her buttocks. She noticed this about 1 week ago. She reports the bumps were painful but now they are less painful, more itchy. She denies redness, warmth or drainage from the area. She has not tried anything OTC for this. She reports she has had this in the past, she thinks they are ingrown hairs.  She also would like screening for STD. She reports her partner told her he had a "slip up". She is not having any symptoms.  Review of Systems     Past Medical History:  Diagnosis Date   Allergy    Anxiety    Asthma    Bipolar 1 disorder (HCC)    BV (bacterial vaginosis)    Depression    HSV-2 seropositive     Current Outpatient Medications  Medication Sig Dispense Refill   ABILIFY MAINTENA 300 MG PRSY prefilled syringe SMARTSIG:1 Syringe(s) IM Every 4 Weeks     busPIRone (BUSPAR) 10 MG tablet TAKE 1 TABLET BY MOUTH 2 TO 3 TIMES DIALY AS NEEDED FOR ANXIETY     butalbital-acetaminophen-caffeine (FIORICET) 50-325-40 MG tablet Take 1 tablet by mouth every 6 (six) hours as needed for headache. 14 tablet 0   clindamycin (CLEOCIN-T) 1 % lotion Apply topically in the morning and at bedtime. 60 mL 1   clonazePAM (KLONOPIN) 1 MG tablet Take 1 tablet by mouth 2 (two) times daily.     cyclobenzaprine (FLEXERIL) 10 MG tablet Take 1 tablet (10 mg total) by mouth 3 (three) times daily as needed for muscle spasms. 30 tablet 0   hydrOXYzine (ATARAX/VISTARIL) 50 MG tablet Take 100 mg by mouth daily.      levonorgestrel (MIRENA) 20 MCG/24HR IUD 1 each by Intrauterine route once.     naproxen (NAPROSYN) 500 MG tablet Take 1 tablet (500 mg total) by mouth 2 (two) times daily with a meal. 30 tablet 0   No current facility-administered medications for this visit.    Allergies  Allergen Reactions   Fish Allergy Anaphylaxis and Swelling     Family History  Problem Relation Age of Onset   Depression Mother    Alcohol abuse Mother    Bipolar disorder Mother     Social History   Socioeconomic History   Marital status: Single    Spouse name: Not on file   Number of children: Not on file   Years of education: Not on file   Highest education level: Not on file  Occupational History   Occupation: disabled    Comment: PTSD  Tobacco Use   Smoking status: Every Day    Packs/day: 0.50    Years: 10.00    Pack years: 5.00    Types: Cigarettes   Smokeless tobacco: Never  Vaping Use   Vaping Use: Former   Substances: THC  Substance and Sexual Activity   Alcohol use: Yes    Alcohol/week: 3.0 standard drinks    Types: 3 Cans of beer per week    Comment: 3 beers weekly   Drug use: Yes    Types: Marijuana   Sexual activity: Yes    Partners: Male    Birth control/protection: I.U.D.    Comment: Mirena  Other Topics Concern   Not on file  Social History Narrative   Not on file   Social Determinants  of Health   Financial Resource Strain: Not on file  Food Insecurity: Not on file  Transportation Needs: Not on file  Physical Activity: Not on file  Stress: Not on file  Social Connections: Not on file  Intimate Partner Violence: Not on file     Constitutional: Denies fever, malaise, fatigue, headache or abrupt weight changes.  Respiratory: Denies difficulty breathing, shortness of breath, cough or sputum production.   Cardiovascular: Denies chest pain, chest tightness, palpitations or swelling in the hands or feet.  Gastrointestinal: Denies abdominal pain, bloating, constipation, diarrhea or blood in the stool.  GU: Denies urgency, frequency, pain with urination, burning sensation, blood in urine, odor or discharge. Skin: Pt reports bumps on buttocks. Denies or ulcercations.   No other specific complaints in a complete review of systems (except as listed in HPI above).  Objective:   Physical Exam  BP (!)  99/57 (BP Location: Left Arm, Patient Position: Sitting, Cuff Size: Normal)   Pulse (!) 57   Temp 98.4 F (36.9 C) (Temporal)   Resp 17   Ht 5' (1.524 m)   Wt 174 lb 1.4 oz (79 kg)   SpO2 100%   BMI 34.00 kg/m   Wt Readings from Last 3 Encounters:  11/03/20 172 lb (78 kg)  10/28/20 175 lb 12.8 oz (79.7 kg)  10/27/20 174 lb (78.9 kg)    General: Appears her stated age, obese,in NAD. Skin: 1 cm non fluctuant abscess noted on left and right gluteal fold. Cardiovascular: Normal rate. Pulmonary/Chest: Normal effort. Neurological: Alert and oriented.   BMET    Component Value Date/Time   NA 139 05/26/2020 0947   K 4.3 05/26/2020 0947   CL 107 05/26/2020 0947   CO2 22 05/26/2020 0947   GLUCOSE 81 05/26/2020 0947   BUN 11 05/26/2020 0947   CREATININE 0.95 05/26/2020 0947   CALCIUM 9.8 05/26/2020 0947   GFRNONAA 81 05/26/2020 0947   GFRAA 94 05/26/2020 0947    Lipid Panel  No results found for: CHOL, TRIG, HDL, CHOLHDL, VLDL, LDLCALC  CBC    Component Value Date/Time   WBC 4.9 05/26/2020 0947   RBC 4.28 05/26/2020 0947   HGB 14.6 05/26/2020 0947   HCT 43.2 05/26/2020 0947   PLT 258 05/26/2020 0947   MCV 100.9 (H) 05/26/2020 0947   MCH 34.1 (H) 05/26/2020 0947   MCHC 33.8 05/26/2020 0947   RDW 12.1 05/26/2020 0947   LYMPHSABS 2,141 05/26/2020 0947   EOSABS 20 05/26/2020 0947   BASOSABS 29 05/26/2020 0947    Hgb A1C No results found for: HGBA1C          Assessment & Plan:   Abscess of Buttocks:  Encouraged warm compresses RX for Keflex 500 mg TID x 10 days  Screen for STD:  Will check g/c, trich, BV and yeast Will check HIV, RPR and Hep C  Return precautions discussed Nicki Reaper, NP This visit occurred during the SARS-CoV-2 public health emergency.  Safety protocols were in place, including screening questions prior to the visit, additional usage of staff PPE, and extensive cleaning of exam room while observing appropriate contact time as  indicated for disinfecting solutions.

## 2020-12-30 LAB — HEPATITIS C ANTIBODY
Hepatitis C Ab: NONREACTIVE
SIGNAL TO CUT-OFF: 0.01 (ref <1.00)

## 2020-12-30 LAB — HIV ANTIBODY (ROUTINE TESTING W REFLEX): HIV 1&2 Ab, 4th Generation: NONREACTIVE

## 2020-12-30 LAB — RPR: RPR Ser Ql: NONREACTIVE

## 2020-12-31 LAB — CERVICOVAGINAL ANCILLARY ONLY
Bacterial Vaginitis (gardnerella): NEGATIVE
Candida Glabrata: NEGATIVE
Candida Vaginitis: NEGATIVE
Chlamydia: NEGATIVE
Comment: NEGATIVE
Comment: NEGATIVE
Comment: NEGATIVE
Comment: NEGATIVE
Comment: NEGATIVE
Comment: NORMAL
Neisseria Gonorrhea: NEGATIVE
Trichomonas: NEGATIVE

## 2021-01-23 ENCOUNTER — Ambulatory Visit (INDEPENDENT_AMBULATORY_CARE_PROVIDER_SITE_OTHER): Payer: Medicare Other | Admitting: Licensed Clinical Social Worker

## 2021-01-23 ENCOUNTER — Other Ambulatory Visit: Payer: Self-pay

## 2021-01-23 DIAGNOSIS — F319 Bipolar disorder, unspecified: Secondary | ICD-10-CM | POA: Diagnosis not present

## 2021-01-23 NOTE — Progress Notes (Signed)
Virtual Visit via Video Note  I connected with Taylia Shanequa Frizell on 01/23/21 at 11:00 AM EDT by a video enabled telemedicine application and verified that I am speaking with the correct person using two identifiers.  Location: Patient: home Provider: remote office [facility], [place])=  I discussed the limitations of evaluation and management by telemedicine and the availability of in person appointments. The patient expressed understanding and agreed to proceed.   I discussed the assessment and treatment plan with the patient. The patient was provided an opportunity to ask questions and all were answered. The patient agreed with the plan and demonstrated an understanding of the instructions.   The patient was advised to call back or seek an in-person evaluation if the symptoms worsen or if the condition fails to improve as anticipated.  I provided 40 minutes of non-face-to-face time during this encounter.   Makella Buckingham R Laiah Pouncey, LCSW   THERAPIST PROGRESS NOTE  Session Time: 11-11:40a  Participation Level: Active  Behavioral Response: GuardedAlertAnxious and Irritable  Type of Therapy: Individual Therapy  Treatment Goals addressed: Anxiety and Coping  Interventions: CBT, Supportive, and Reframing  Summary: Cynde Menard Taussig is a 30 y.o. female who presents with increasing amounts of anxiety today compared to last session. Patient feeling overwhelmed because she has a lot going on right now, school work, work study job, and Special educational needs teacher life. Patient reports that overall mood has been fluctuating depending on situational triggers. Patient is feeling rather indifferent and unmotivated with the start of her classes soon. Reviewed coping mechanisms, and patient reports that her primary coping mechanism for managing anxiety is smoking marijuana. "I take Klonipin too". Reviewed alternative coping mechanisms, including physical activity in relaxation strategies. Patient reports  that she often will take a nap, and that does make her feel refreshed and less stressed. Patient reports that she is actively trying to lose weight, and is trying to make healthier choices with foods. Patient reports that she continues to stress about her overall anxiety, financial concerns, and worries about the future. Allowed patient to discuss all of these things, and identify things that she can control in steps that she's already taking to help manage these concerns.Continued recommendations are as follows: self care behaviors, positive social engagements, focusing on overall work/home/life balance, and focusing on positive physical and emotional wellness. .   Suicidal/Homicidal: No  Therapist Response: Carena is able to identify triggers and utilize coping mechanisms to manage anxiety in the moment. Lynetta is able to implement appropriate relaxation and diversion activities to decrease overall levels of anxiety. Nevaya is continuing to develop skills that will allow her to engage socially more with peers. These behaviors are reflective of personal growth and progress. Treatment to continue  Plan: Return again in 6 weeks.  Diagnosis: Axis I: Bipolar I     Axis II: No diagnosis    Ernest Haber Mataeo Ingwersen, LCSW 01/23/2021

## 2021-02-06 ENCOUNTER — Other Ambulatory Visit: Payer: Self-pay

## 2021-02-06 ENCOUNTER — Ambulatory Visit
Admission: RE | Admit: 2021-02-06 | Discharge: 2021-02-06 | Disposition: A | Discharge status: Home / Self Care | Payer: Medicare Other | Source: Ambulatory Visit | Attending: Emergency Medicine | Admitting: Emergency Medicine

## 2021-02-06 VITALS — BP 107/77 | HR 72 | Temp 98.1°F | Resp 18

## 2021-02-06 DIAGNOSIS — L02214 Cutaneous abscess of groin: Secondary | ICD-10-CM

## 2021-02-06 MED ORDER — SULFAMETHOXAZOLE-TRIMETHOPRIM 800-160 MG PO TABS: 1.0000 | ORAL_TABLET | Freq: Two times a day (BID) | ORAL | 0 refills | Status: AC

## 2021-02-06 NOTE — ED Triage Notes (Signed)
Pt here with abscess to left groin. Started to drain, but stopped and is now painful.

## 2021-02-06 NOTE — ED Provider Notes (Signed)
Ellen Kaiser    CSN: [id number] Arrival date & time: 02/06/21  1531      History   Chief Complaint Chief Complaint  Patient presents with   Abscess    HPI Ellen Kaiser is a 30 y.o. female.  Patient presents with 1 week history of an abscess in her left groin.  She states it was draining pus yesterday but stopped.  She denies fever, chills, redness, numbness, weakness, paresthesias, abdominal pain, dysuria, vaginal discharge, pelvic pain, or other symptoms.  No treatment attempted at home.  She has history of hidradenitis suppurativa.    The history is provided by the patient and medical records.   Past Medical History:  Diagnosis Date   Allergy    Anxiety    Asthma    Bipolar 1 disorder (HCC)    BV (bacterial vaginosis)    Depression    HSV-2 seropositive     Patient Active Problem List   Diagnosis Date Noted   Hidradenitis suppurativa 12/28/2019   Anxiety and depression 10/08/2019   GERD (gastroesophageal reflux disease) 10/08/2019   HSV-2 seropositive 05/23/2019   Bipolar 1 disorder (HCC) 04/02/2019   Migraine without aura and without status migrainosus, not intractable 04/02/2019   Mild intermittent asthma without complication 03/21/2015    History reviewed. No pertinent surgical history.  OB History     Gravida  0   Para  0   Term  0   Preterm  0   AB  0   Living  0      SAB  0   IAB  0   Ectopic  0   Multiple  0   Live Births  0            Home Medications    Prior to Admission medications   Medication Sig Start Date End Date Taking? Authorizing Provider  sulfamethoxazole-trimethoprim (BACTRIM DS) 800-160 MG tablet Take 1 tablet by mouth 2 (two) times daily for 7 days. 02/06/21 02/13/21 Yes Mickie Bail, NP  ABILIFY MAINTENA 300 MG PRSY prefilled syringe SMARTSIG:1 Syringe(s) IM Every 4 Weeks 10/15/20   [provider]  busPIRone (BUSPAR) 10 MG tablet TAKE 1 TABLET BY MOUTH 2 TO 3 TIMES DIALY AS NEEDED  FOR ANXIETY 08/27/20   [provider]  butalbital-acetaminophen-caffeine (FIORICET) 50-325-40 MG tablet Take 1 tablet by mouth every 6 (six) hours as needed for headache. 11/03/20   Lorre Munroe, NP  cephALEXin (KEFLEX) 500 MG capsule Take 1 capsule (500 mg total) by mouth 3 (three) times daily. 12/29/20   Lorre Munroe, NP  clindamycin (CLEOCIN-T) 1 % lotion Apply topically in the morning and at bedtime. Patient not taking: Reported on 12/29/2020 07/16/20 07/16/21  Deirdre Evener, MD  clonazePAM (KLONOPIN) 1 MG tablet Take 1 tablet by mouth 2 (two) times daily. 02/28/19   [provider]  cyclobenzaprine (FLEXERIL) 10 MG tablet Take 1 tablet (10 mg total) by mouth 3 (three) times daily as needed for muscle spasms. 11/03/20   Lorre Munroe, NP  hydrOXYzine (ATARAX/VISTARIL) 50 MG tablet Take 100 mg by mouth daily.  03/30/19   [provider]  levonorgestrel (MIRENA) 20 MCG/24HR IUD 1 each by Intrauterine route once.    [provider]  naproxen (NAPROSYN) 500 MG tablet Take 1 tablet (500 mg total) by mouth 2 (two) times daily with a meal. 11/03/20   Baity, Salvadore Oxford, NP    Family History Family History  Problem Relation  Age of Onset   Depression Mother    Alcohol abuse Mother    Bipolar disorder Mother     Social History Social History   Tobacco Use   Smoking status: Every Day    Packs/day: 0.50    Years: 10.00    Pack years: 5.00    Types: Cigarettes   Smokeless tobacco: Never  Vaping Use   Vaping Use: Former   Substances: THC  Substance Use Topics   Alcohol use: Yes    Alcohol/week: 3.0 standard drinks    Types: 3 Cans of beer per week    Comment: 3 beers weekly   Drug use: Yes    Types: Marijuana     Allergies   Fish allergy   Review of Systems Review of Systems  Constitutional:  Negative for chills and fever.  Respiratory:  Negative for cough and shortness of breath.   Cardiovascular:  Negative for chest pain and palpitations.   Gastrointestinal:  Negative for abdominal pain and vomiting.  Genitourinary:  Negative for dysuria and hematuria.  Skin:  Positive for wound. Negative for color change.  Neurological:  Negative for weakness and numbness.  All other systems reviewed and are negative.   Physical Exam Triage Vital Signs ED Triage Vitals  Enc Vitals Group     BP 02/06/21 1539 107/77     Pulse Rate 02/06/21 1539 72     Resp 02/06/21 1539 18     Temp 02/06/21 1539 98.1 F (36.7 C)     Temp src --      SpO2 02/06/21 1539 98 %     Weight --      Height --      Head Circumference --      Peak Flow --      Pain Score 02/06/21 1540 10     Pain Loc --      Pain Edu? --      Excl. in GC? --    No data found.  Updated Vital Signs BP 107/77   Pulse 72   Temp 98.1 F (36.7 C)   Resp 18   SpO2 98%   Visual Acuity Right Eye Distance:   Left Eye Distance:   Bilateral Distance:    Right Eye Near:   Left Eye Near:    Bilateral Near:     Physical Exam Vitals and nursing note reviewed.  Constitutional:      General: She is not in acute distress.    Appearance: She is well-developed. She is not ill-appearing.  HENT:     Head: Normocephalic and atraumatic.     Mouth/Throat:     Mouth: Mucous membranes are moist.  Eyes:     Conjunctiva/sclera: Conjunctivae normal.  Cardiovascular:     Rate and Rhythm: Normal rate and regular rhythm.     Heart sounds: Normal heart sounds.  Pulmonary:     Effort: Pulmonary effort is normal. No respiratory distress.     Breath sounds: Normal breath sounds.  Abdominal:     Palpations: Abdomen is soft.     Tenderness: There is no abdominal tenderness. There is no guarding or rebound.  Musculoskeletal:        General: Normal range of motion.     Cervical back: Neck supple.  Skin:    General: Skin is warm and dry.     Findings: Lesion present. No bruising or erythema.     Comments: 3 cm tender, firm, nonfluctuant area of induration in left  groin.  No open  wounds, erythema, drainage.  Neurological:     General: No focal deficit present.     Mental Status: She is alert and oriented to person, place, and time.     Gait: Gait normal.  Psychiatric:        Mood and Affect: Mood normal.        Behavior: Behavior normal.     UC Treatments / Results  Labs (all labs ordered are listed, but only abnormal results are displayed) Labs Reviewed - No data to display  EKG   Radiology No results found.  Procedures Procedures (including critical care time)  Medications Ordered in UC Medications - No data to display  Initial Impression / Assessment and Plan / UC Course  I have reviewed the triage vital signs and the nursing notes.  Pertinent labs & imaging results that were available during my care of the patient were reviewed by me and considered in my medical decision making (see chart for details).  Abscess of left groin.  Needle aspiration with only blood returned.  No open wounds or purulent drainage.  The abscess is firm and nonfluctuant.  Treating with Bactrim DS.  Education provided on skin abscesses.  Instructed patient to follow-up with her PCP if her symptoms are not improving.  She agrees to plan of care.   Final Clinical Impressions(s) / UC Diagnoses   Final diagnoses:  Abscess of groin, left     Discharge Instructions      Take the antibiotic as directed.  See the attached information on skin abscesses.  Follow up with your primary care provider if your symptoms are not improving.         ED Prescriptions     Medication Sig Dispense Auth. Provider   sulfamethoxazole-trimethoprim (BACTRIM DS) 800-160 MG tablet Take 1 tablet by mouth 2 (two) times daily for 7 days. 14 tablet Mickie Bail, NP      PDMP not reviewed this encounter.   Mickie Bail, NP 02/06/21 [phone]

## 2021-02-06 NOTE — Discharge Instructions (Addendum)
Take the antibiotic as directed.  See the attached information on skin abscesses.  Follow up with your primary care provider if your symptoms are not improving.

## 2021-03-06 ENCOUNTER — Other Ambulatory Visit: Payer: Self-pay

## 2021-03-06 ENCOUNTER — Ambulatory Visit: Payer: Medicare Other | Admitting: Licensed Clinical Social Worker

## 2021-03-06 NOTE — Progress Notes (Signed)
[place] answered video session scheduled at 9am. Pt wanted to reschedule appointment due to needing an emergency repair on her vehicle.   Will place pt on call list for appointment

## 2021-03-12 ENCOUNTER — Ambulatory Visit
Admission: EM | Admit: 2021-03-12 | Discharge: 2021-03-12 | Disposition: A | Discharge status: Home / Self Care | Payer: Medicare Other | Attending: Emergency Medicine | Admitting: Emergency Medicine

## 2021-03-12 ENCOUNTER — Other Ambulatory Visit: Payer: Self-pay

## 2021-03-12 DIAGNOSIS — Z113 Encounter for screening for infections with a predominantly sexual mode of transmission: Secondary | ICD-10-CM | POA: Diagnosis not present

## 2021-03-12 NOTE — Discharge Instructions (Signed)
Your testing has been sent to the lab.  If your testing results as positive, you will be notified via phone and we will initiate treatment at that time.  If you do not receive a phone call from [place] within the next 2 to 3 days, check your MyChart for updated health information. Do not engage in sex until you know your results. If you test positive you will need to abstain from sex for 7 days and notify all partners.

## 2021-03-12 NOTE — ED Provider Notes (Addendum)
SUBJECTIVE:  Ellen Kaiser is a very pleasant 30 y.o. Female presents with need for STD screening. No symptoms or exposures. Wants swab and blood work.  ROS: General/Constitutional: No fever, chills, or sweats GI: No abdominal pain, nausea/vomiting or diarrhea GU: No urinary frequency or dysuria Genitalia: As above Lymph: No swelling, red streaks or swollen lymph nodes Skin: No rashes or skin lesions   OBJECTIVE: Vitals:   03/12/21 1137  BP: 103/71  Pulse: 70  Resp: 16  Temp: 98.1 F (36.7 C)  SpO2: 98%     General: Appears well-developed and well-nourished. No acute distress.  Cardiovascular: Normal rate.  Regular rhythm, no gallops or murmurs.  No rubs. Pulm/Chest: No respiratory distress.  CTA bilateral upper and lower lobes. Neurological: Alert and oriented to person, place, and time.  Skin: Skin is warm and dry.  Psychiatric: Normal mood, affect, behavior, and thought content.  GU:  Deferred secondary to self collect specimen   Laboratory:  Orders Placed This Encounter  Procedures   HIV Antibody (routine testing w rflx)   RPR   No results found for any visits on 03/12/21.   Assessment: 1. Screening for STDs (sexually transmitted diseases) - Cervicovaginal ancillary only; Standing - HIV Antibody (routine testing w rflx); Standing - HIV4GL Save Tube; Standing - Cervicovaginal ancillary only - HIV Antibody (routine testing w rflx) - HIV4GL Save Tube - RPR; Standing - RPR   Plan:  MDM: Patient presents with need for STD screening. No symptoms or exposures. Wants swab and blood work.  Ordered an APTIMA swab today along with HIV and syphilis testing.  Advised the patient that these tests will be back within about the next 2 or 3 days.  Advised that we would call with any positive results that require treatment.  Patient verbalized understanding and agreed with plan.  Patient stable upon discharge.  Return as needed.  Instructions:    Discharge  Instructions      Your testing has been sent to the lab.  If your testing results as positive, you will be notified via phone and we will initiate treatment at that time.  If you do not receive a phone call from [place] within the next 2 to 3 days, check your MyChart for updated health information. Do not engage in sex until you know your results. If you test positive you will need to abstain from sex for 7 days and notify all partners.          Amalia Greenhouse, FNP 03/12/21 1200    Amalia Greenhouse, FNP 03/12/21 1200

## 2021-03-12 NOTE — ED Triage Notes (Signed)
Patient presents to Urgent Care with complaints of STD/HIV testing. She reports new sexual encounter a month ago. Denies any symptoms.

## 2021-03-13 LAB — RPR: RPR Ser Ql: NONREACTIVE

## 2021-03-13 LAB — CERVICOVAGINAL ANCILLARY ONLY
Bacterial Vaginitis (gardnerella): NEGATIVE
Candida Glabrata: NEGATIVE
Candida Vaginitis: NEGATIVE
Chlamydia: NEGATIVE
Comment: NEGATIVE
Comment: NEGATIVE
Comment: NEGATIVE
Comment: NEGATIVE
Comment: NEGATIVE
Comment: NORMAL
Neisseria Gonorrhea: NEGATIVE
Trichomonas: NEGATIVE

## 2021-03-13 LAB — HIV ANTIBODY (ROUTINE TESTING W REFLEX): HIV Screen 4th Generation wRfx: NONREACTIVE

## 2021-04-13 ENCOUNTER — Other Ambulatory Visit: Payer: Self-pay

## 2021-04-13 ENCOUNTER — Ambulatory Visit (INDEPENDENT_AMBULATORY_CARE_PROVIDER_SITE_OTHER): Payer: Medicare Other | Admitting: Internal Medicine

## 2021-04-13 ENCOUNTER — Encounter: Payer: Self-pay | Admitting: Internal Medicine

## 2021-04-13 VITALS — BP 126/79 | HR 70 | Resp 17 | Ht 60.0 in | Wt 177.0 lb

## 2021-04-13 DIAGNOSIS — J4520 Mild intermittent asthma, uncomplicated: Secondary | ICD-10-CM | POA: Diagnosis not present

## 2021-04-13 DIAGNOSIS — E66812 Obesity, class 2: Secondary | ICD-10-CM | POA: Insufficient documentation

## 2021-04-13 DIAGNOSIS — E6609 Other obesity due to excess calories: Secondary | ICD-10-CM | POA: Insufficient documentation

## 2021-04-13 DIAGNOSIS — Z6836 Body mass index (BMI) 36.0-36.9, adult: Secondary | ICD-10-CM | POA: Insufficient documentation

## 2021-04-13 DIAGNOSIS — Z23 Encounter for immunization: Secondary | ICD-10-CM

## 2021-04-13 DIAGNOSIS — Z6834 Body mass index (BMI) 34.0-34.9, adult: Secondary | ICD-10-CM | POA: Diagnosis not present

## 2021-04-13 MED ORDER — BUPROPION HCL ER (SR) 150 MG PO TB12: ORAL_TABLET | ORAL | 0 refills | Status: DC

## 2021-04-13 MED ORDER — ALBUTEROL SULFATE HFA 108 (90 BASE) MCG/ACT IN AERS: 1.0000 | INHALATION_SPRAY | Freq: Four times a day (QID) | RESPIRATORY_TRACT | 0 refills | Status: DC | PRN

## 2021-04-13 NOTE — Assessment & Plan Note (Signed)
Encourage diet and exercise for weight loss 

## 2021-04-13 NOTE — Progress Notes (Signed)
Subjective:    Patient ID: Ellen Kaiser, female    DOB: 1991-06-09, 30 y.o.   MRN: [id number]  HPI  Pt presents to the clinic today to discuss smoking cessation. She has been smoking for 15 years.  She is currently smoking 5 to 10 cigarettes/day.  She is experiencing some shortness of breath but also has a history of asthma.  She thinks she has a history of lung cancer in her family.  She is using Albuterol as needed for her asthma and would like a refill of this today.  Review of Systems     Past Medical History:  Diagnosis Date   Allergy    Anxiety    Asthma    Bipolar 1 disorder (HCC)    BV (bacterial vaginosis)    Depression    HSV-2 seropositive     Current Outpatient Medications  Medication Sig Dispense Refill   ABILIFY MAINTENA 300 MG PRSY prefilled syringe SMARTSIG:1 Syringe(s) IM Every 4 Weeks     albuterol (VENTOLIN HFA) 108 (90 Base) MCG/ACT inhaler Inhale into the lungs every 6 (six) hours as needed for wheezing or shortness of breath.     busPIRone (BUSPAR) 10 MG tablet TAKE 1 TABLET BY MOUTH 2 TO 3 TIMES DIALY AS NEEDED FOR ANXIETY     butalbital-acetaminophen-caffeine (FIORICET) 50-325-40 MG tablet Take 1 tablet by mouth every 6 (six) hours as needed for headache. 14 tablet 0   clonazePAM (KLONOPIN) 1 MG tablet Take 1 tablet by mouth 2 (two) times daily.     hydrOXYzine (ATARAX/VISTARIL) 50 MG tablet Take 100 mg by mouth daily.      levonorgestrel (MIRENA) 20 MCG/24HR IUD 1 each by Intrauterine route once.     naproxen (NAPROSYN) 500 MG tablet Take 1 tablet (500 mg total) by mouth 2 (two) times daily with a meal. 30 tablet 0   clindamycin (CLEOCIN-T) 1 % lotion Apply topically in the morning and at bedtime. (Patient not taking: No sig reported) 60 mL 1   cyclobenzaprine (FLEXERIL) 10 MG tablet Take 1 tablet (10 mg total) by mouth 3 (three) times daily as needed for muscle spasms. (Patient not taking: Reported on 04/13/2021) 30 tablet 0   No current  facility-administered medications for this visit.    Allergies  Allergen Reactions   Fish Allergy Anaphylaxis and Swelling    Family History  Problem Relation Age of Onset   Depression Mother    Alcohol abuse Mother    Bipolar disorder Mother     Social History   Socioeconomic History   Marital status: Single    Spouse name: Not on file   Number of children: Not on file   Years of education: Not on file   Highest education level: Not on file  Occupational History   Occupation: disabled    Comment: PTSD  Tobacco Use   Smoking status: Every Day    Packs/day: 0.50    Years: 10.00    Pack years: 5.00    Types: Cigarettes   Smokeless tobacco: Never  Vaping Use   Vaping Use: Former   Substances: THC  Substance and Sexual Activity   Alcohol use: Yes    Alcohol/week: 3.0 standard drinks    Types: 3 Cans of beer per week    Comment: 3 beers weekly   Drug use: Yes    Types: Marijuana   Sexual activity: Yes    Partners: Male    Birth control/protection: I.U.D.    Comment: Mirena  Other Topics Concern   Not on file  Social History Narrative   Not on file   Social Determinants of Health   Financial Resource Strain: Not on file  Food Insecurity: Not on file  Transportation Needs: Not on file  Physical Activity: Not on file  Stress: Not on file  Social Connections: Not on file  Intimate Partner Violence: Not on file     Constitutional: Denies fever, malaise, fatigue, headache or abrupt weight changes.  HEENT: Denies eye pain, eye redness, ear pain, ringing in the ears, wax buildup, runny nose, nasal congestion, bloody nose, or sore throat. Respiratory: Patient reports intermittent shortness of breath.  Denies difficulty breathing, cough or sputum production.   Cardiovascular: Denies chest pain, chest tightness, palpitations or swelling in the hands or feet.   No other specific complaints in a complete review of systems (except as listed in HPI  above).  Objective:   Physical Exam   BP 126/79 (BP Location: Right Arm, Patient Position: Sitting, Cuff Size: Normal)   Pulse 70   Resp 17   Ht 5' (1.524 m)   Wt 177 lb (80.3 kg)   SpO2 100%   BMI 34.57 kg/m  Wt Readings from Last 3 Encounters:  04/13/21 177 lb (80.3 kg)  12/29/20 174 lb 1.4 oz (79 kg)  11/03/20 172 lb (78 kg)    General: Appears her stated age, obese, in NAD. HEENT: Head: normal shape and size; Eyes:  EOMs intact;  Cardiovascular: Normal rate and rhythm. S1,S2 noted.  No murmur, rubs or gallops noted.  Pulmonary/Chest: Normal effort and positive vesicular breath sounds. No respiratory distress. No wheezes, rales or ronchi noted.  Neurological: Alert and oriented.   BMET    Component Value Date/Time   NA 139 05/26/2020 0947   K 4.3 05/26/2020 0947   CL 107 05/26/2020 0947   CO2 22 05/26/2020 0947   GLUCOSE 81 05/26/2020 0947   BUN 11 05/26/2020 0947   CREATININE 0.95 05/26/2020 0947   CALCIUM 9.8 05/26/2020 0947   GFRNONAA 81 05/26/2020 0947   GFRAA 94 05/26/2020 0947    Lipid Panel  No results found for: CHOL, TRIG, HDL, CHOLHDL, VLDL, LDLCALC  CBC    Component Value Date/Time   WBC 4.9 05/26/2020 0947   RBC 4.28 05/26/2020 0947   HGB 14.6 05/26/2020 0947   HCT 43.2 05/26/2020 0947   PLT 258 05/26/2020 0947   MCV 100.9 (H) 05/26/2020 0947   MCH 34.1 (H) 05/26/2020 0947   MCHC 33.8 05/26/2020 0947   RDW 12.1 05/26/2020 0947   LYMPHSABS 2,141 05/26/2020 0947   EOSABS 20 05/26/2020 0947   BASOSABS 29 05/26/2020 0947    Hgb A1C No results found for: HGBA1C         Assessment & Plan:   Encounter for Smoking Cessation:  Discussed use of gums, patches, Wellbutrin or Chantix Encouraged her to wean her cigarette use, pick a quit date and stick to it Rx for Wellbutrin SR 150 mg twice daily, advised her when she is ready to stop this she needs to let me know so that we can wean off this medication Support offered  Nicki Reaper,  NP This visit occurred during the SARS-CoV-2 public health emergency.  Safety protocols were in place, including screening questions prior to the visit, additional usage of staff PPE, and extensive cleaning of exam room while observing appropriate contact time as indicated for disinfecting solutions.

## 2021-04-13 NOTE — Assessment & Plan Note (Signed)
Albuterol refilled today Flu and Pneumovax today

## 2021-04-13 NOTE — Patient Instructions (Signed)
Managing the Challenge of Quitting Smoking Quitting smoking is a physical and mental challenge. You will face cravings, withdrawal symptoms, and temptation. Before quitting, work with your health care provider to make a plan that can help you manage quitting. Preparation canhelp you quit and keep you from giving in. How to manage lifestyle changes Managing stress Stress can make you want to smoke, and wanting to smoke may cause stress. It is important to find ways to manage your stress. You might try some of the following: Practice relaxation techniques. Breathe slowly and deeply, in through your nose and out through your mouth. Listen to music. Soak in a bath or take a shower. Imagine a peaceful place or vacation. Get some support. Talk with family or friends about your stress. Join a support group. Talk with a counselor or therapist. Get some physical activity. Go for a walk, run, or bike ride. Play a favorite sport. Practice yoga.  Medicines Talk with your health care provider about medicines that might help you dealwith cravings and make quitting easier for you. Relationships Social situations can be difficult when you are quitting smoking. To manage this, you can: Avoid parties and other social situations where people might be smoking. Avoid alcohol. Leave right away if you have the urge to smoke. Explain to your family and friends that you are quitting smoking. Ask for support and let them know you might be a bit grumpy. Plan activities where smoking is not an option. General instructions Be aware that many people gain weight after they quit smoking. However, not everyone does. To keep from gaining weight, have a plan in place before you quit and stick to the plan after you quit. Your plan should include: Having healthy snacks. When you have a craving, it may help to: Eat popcorn, carrots, celery, or other cut vegetables. Chew sugar-free gum. Changing how you eat. Eat small  portion sizes at meals. Eat 4-6 small meals throughout the day instead of 1-2 large meals a day. Be mindful when you eat. Do not watch television or do other things that might distract you as you eat. Exercising regularly. Make time to exercise each day. If you do not have time for a long workout, do short bouts of exercise for 5-10 minutes several times a day. Do some form of strengthening exercise, such as weight lifting. Do some exercise that gets your heart beating and causes you to breathe deeply, such as walking fast, running, swimming, or biking. This is very important. Drinking plenty of water or other low-calorie or no-calorie drinks. Drink 6-8 glasses of water daily.  How to recognize withdrawal symptoms Your body and mind may experience discomfort as you try to get used to not having nicotine in your system. These effects are called withdrawal symptoms. They may include: Feeling hungrier than normal. Having trouble concentrating. Feeling irritable or restless. Having trouble sleeping. Feeling depressed. Craving a cigarette. To manage withdrawal symptoms: Avoid places, people, and activities that trigger your cravings. Remember why you want to quit. Get plenty of sleep. Avoid coffee and other caffeinated drinks. These may worsen some of your symptoms. These symptoms may surprise you. But be assured that they are normal to havewhen quitting smoking. How to manage cravings Come up with a plan for how to deal with your cravings. The plan should include the following: A definition of the specific situation you want to deal with. An alternative action you will take. A clear idea for how this action will help. The   name of someone who might help you with this. Cravings usually last for 5-10 minutes. Consider taking the following actions to help you with your plan to deal with cravings: Keep your mouth busy. Chew sugar-free gum. Suck on hard candies or a straw. Brush your  teeth. Keep your hands and body busy. Change to a different activity right away. Squeeze or play with a ball. Do an activity or a hobby, such as making bead jewelry, practicing needlepoint, or working with wood. Mix up your normal routine. Take a short exercise break. Go for a quick walk or run up and down stairs. Focus on doing something kind or helpful for someone else. Call a friend or family member to talk during a craving. Join a support group. Contact a quitline. Where to find support To get help or find a support group: Call the National Cancer Institute's Smoking Quitline: 1-800-QUIT NOW (784-8669) Visit the website of the Substance Abuse and Mental Health Services Administration: www.samhsa.gov Text QUIT to SmokefreeTXT: 478848 Where to find more information Visit these websites to find more information on quitting smoking: National Cancer Institute: www.smokefree.gov American Lung Association: www.lung.org American Cancer Society: www.cancer.org Centers for Disease Control and Prevention: www.cdc.gov American Heart Association: www.heart.org Contact a health care provider if: You want to change your plan for quitting. The medicines you are taking are not helping. Your eating feels out of control or you cannot sleep. Get help right away if: You feel depressed or become very anxious. Summary Quitting smoking is a physical and mental challenge. You will face cravings, withdrawal symptoms, and temptation to smoke again. Preparation can help you as you go through these challenges. Try different techniques to manage stress, handle social situations, and prevent weight gain. You can deal with cravings by keeping your mouth busy (such as by chewing gum), keeping your hands and body busy, calling family or friends, or contacting a quitline for people who want to quit smoking. You can deal with withdrawal symptoms by avoiding places where people smoke, getting plenty of rest, and  avoiding drinks with caffeine. This information is not intended to replace advice given to you by your health care provider. Make sure you discuss any questions you have with your healthcare provider. Document Revised: 03/27/2019 Document Reviewed: 03/27/2019 Elsevier Patient Education  2022 Elsevier Inc.  

## 2021-04-23 ENCOUNTER — Ambulatory Visit: Payer: Medicare Other | Admitting: Licensed Clinical Social Worker

## 2021-04-27 ENCOUNTER — Other Ambulatory Visit (HOSPITAL_COMMUNITY)
Admission: RE | Admit: 2021-04-27 | Discharge: 2021-04-27 | Disposition: A | Discharge status: Home / Self Care | Payer: Medicare Other | Source: Ambulatory Visit | Attending: Internal Medicine | Admitting: Internal Medicine

## 2021-04-27 ENCOUNTER — Encounter: Payer: Self-pay | Admitting: Internal Medicine

## 2021-04-27 ENCOUNTER — Other Ambulatory Visit: Payer: Self-pay

## 2021-04-27 ENCOUNTER — Ambulatory Visit (INDEPENDENT_AMBULATORY_CARE_PROVIDER_SITE_OTHER): Payer: Medicare Other | Admitting: Internal Medicine

## 2021-04-27 VITALS — BP 106/72 | HR 72 | Temp 98.4°F | Resp 17 | Ht 60.0 in | Wt 177.2 lb

## 2021-04-27 DIAGNOSIS — N898 Other specified noninflammatory disorders of vagina: Secondary | ICD-10-CM | POA: Diagnosis present

## 2021-04-27 DIAGNOSIS — Z113 Encounter for screening for infections with a predominantly sexual mode of transmission: Secondary | ICD-10-CM

## 2021-04-27 MED ORDER — NAPROXEN 500 MG PO TABS: 500.0000 mg | ORAL_TABLET | Freq: Two times a day (BID) | ORAL | 0 refills | Status: DC

## 2021-04-27 NOTE — Patient Instructions (Signed)
Vaginitis Vaginitis is irritation and swelling of the vagina. Treatment will depend on the cause. What are the causes? It can be caused by: Bacteria. Yeast. A parasite. A virus. Low hormone levels. Bubble baths, scented tampons, and feminine sprays. Other things can change the balance of the yeast and bacteria that live in the vagina. These include: Antibiotic medicines. Not being clean enough. Some birth control methods. Sex. Infection. Diabetes. A weakened body defense system (immune system). What increases the risk? Smoking or being around someone who smokes. Using washes (douches), scented tampons, or scented pads. Wearing tight pants or thong underwear. Using birth control pills or an IUD. Having sex without a condom or having a lot of partners. Having an STI. Using a certain product to kill sperm (nonoxynol-9). Eating foods that are high in sugar. Having diabetes. Having low levels of a female hormone. Having a weakened body defense system. Being pregnant or breastfeeding. What are the signs or symptoms? Fluid coming from the vagina that is not normal. A bad smell. Itching, pain, or swelling. Pain with sex. Pain or burning when you pee (urinate). Sometimes there are no symptoms. How is this treated? Treatment may include: Antibiotic creams or pills. Antifungal medicines. Medicines to ease symptoms if you have a virus. Your sex partner should also be treated. Estrogen medicines. Avoiding scented soaps, sprays, or douches. Stopping use of products that caused irritation and then using a cream to treat symptoms. Follow these instructions at home: Lifestyle Keep the area around your vagina clean and dry. Avoid using soap. Rinse the area with water. Until your doctor says it is okay: Do not use washes for the vagina. Do not use tampons. Do not have sex. Wipe from front to back after going to the bathroom. When your doctor says it is okay, practice safe sex and  use condoms. General instructions Take over-the-counter and prescription medicines only as told by your doctor. If you were prescribed an antibiotic medicine, take or use it as told by your doctor. Do not stop taking or using it even if you start to feel better. Keep all follow-up visits. How is this prevented? Do not use things that can irritate the vagina, such as fabric softeners. Avoid these products if they are scented: Sprays. Detergents. Tampons. Products for cleaning the vagina. Soaps or bubble baths. Let air reach your vagina. To do this: Wear cotton underwear. Do not wear: Underwear while you sleep. Tight pants. Thong underwear. Underwear or nylons without a cotton panel. Take off any wet clothing, such as bathing suits, as soon as you can. Practice safe sex and use condoms. Contact a doctor if: You have pain in your belly or in the area between your hips. You have a fever or chills. Your symptoms last for more than 2-3 days. Get help right away if: You have a fever and your symptoms get worse all of a sudden. Summary Vaginitis is irritation and swelling of the vagina. Treatment will depend on the cause of the condition. Do not use washes or tampons or have sex until your doctor says it is okay. This information is not intended to replace advice given to you by your health care provider. Make sure you discuss any questions you have with your health care provider. Document Revised: 12/06/2019 Document Reviewed: 12/06/2019 Elsevier Patient Education  2022 Elsevier Inc.  

## 2021-04-27 NOTE — Progress Notes (Signed)
Subjective:    Patient ID: Ellen Kaiser, female    DOB: 1990/11/22, 30 y.o.   MRN: [id number]  HPI  Patient presents the clinic today with complaint of vaginal discharge, itching and odor.  This started 1 week ago. She reports the discharge is creamy, yellow in color.  She reports that has a fishy odor.  She denies pelvic pain, dysuria, abnormal vaginal bleeding, fever, chills or nausea.  She has had BV in the past and reports this seems similar.  She is sexually active.  She has not tried anything OTC for this.  Review of Systems     Past Medical History:  Diagnosis Date   Allergy    Anxiety    Asthma    Bipolar 1 disorder (HCC)    BV (bacterial vaginosis)    Depression    HSV-2 seropositive     Current Outpatient Medications  Medication Sig Dispense Refill   ABILIFY MAINTENA 300 MG PRSY prefilled syringe SMARTSIG:1 Syringe(s) IM Every 4 Weeks     albuterol (VENTOLIN HFA) 108 (90 Base) MCG/ACT inhaler Inhale 1 puff into the lungs every 6 (six) hours as needed for wheezing or shortness of breath. 18 g 0   buPROPion (WELLBUTRIN SR) 150 MG 12 hr tablet Take 1 tab PO daily in am x 3 days then increase to 1 tab PO BID thereafter 180 tablet 0   busPIRone (BUSPAR) 10 MG tablet TAKE 1 TABLET BY MOUTH 2 TO 3 TIMES DIALY AS NEEDED FOR ANXIETY     butalbital-acetaminophen-caffeine (FIORICET) 50-325-40 MG tablet Take 1 tablet by mouth every 6 (six) hours as needed for headache. 14 tablet 0   clonazePAM (KLONOPIN) 1 MG tablet Take 1 tablet by mouth 2 (two) times daily.     hydrOXYzine (ATARAX/VISTARIL) 50 MG tablet Take 100 mg by mouth daily.      levonorgestrel (MIRENA) 20 MCG/24HR IUD 1 each by Intrauterine route once.     naproxen (NAPROSYN) 500 MG tablet Take 1 tablet (500 mg total) by mouth 2 (two) times daily with a meal. 30 tablet 0   No current facility-administered medications for this visit.    Allergies  Allergen Reactions   Fish Allergy Anaphylaxis and Swelling     Family History  Problem Relation Age of Onset   Depression Mother    Alcohol abuse Mother    Bipolar disorder Mother     Social History   Socioeconomic History   Marital status: Single    Spouse name: Not on file   Number of children: Not on file   Years of education: Not on file   Highest education level: Not on file  Occupational History   Occupation: disabled    Comment: PTSD  Tobacco Use   Smoking status: Every Day    Packs/day: 0.50    Years: 10.00    Pack years: 5.00    Types: Cigarettes   Smokeless tobacco: Never  Vaping Use   Vaping Use: Former   Substances: THC  Substance and Sexual Activity   Alcohol use: Yes    Alcohol/week: 3.0 standard drinks    Types: 3 Cans of beer per week    Comment: 3 beers weekly   Drug use: Yes    Types: Marijuana   Sexual activity: Yes    Partners: Male    Birth control/protection: I.U.D.    Comment: Mirena  Other Topics Concern   Not on file  Social History Narrative   Not on file  Social Determinants of Health   Financial Resource Strain: Not on file  Food Insecurity: Not on file  Transportation Needs: Not on file  Physical Activity: Not on file  Stress: Not on file  Social Connections: Not on file  Intimate Partner Violence: Not on file     Constitutional: Denies fever, malaise, fatigue, headache or abrupt weight changes.  Respiratory: Denies difficulty breathing, shortness of breath, cough or sputum production.   Cardiovascular: Denies chest pain, chest tightness, palpitations or swelling in the hands or feet.  Gastrointestinal: Denies abdominal pain, bloating, constipation, diarrhea or blood in the stool.  GU: Patient reports vaginal discharge, itching and odor.  Denies urgency, frequency, pain with urination, burning sensation, blood in urine. Skin: Denies redness, rashes, lesions or ulcercations.   No other specific complaints in a complete review of systems (except as listed in HPI  above).  Objective:   Physical Exam  There were no vitals taken for this visit.BP 106/72 (BP Location: Right Arm, Patient Position: Sitting, Cuff Size: Normal)   Pulse 72   Temp 98.4 F (36.9 C) (Temporal)   Resp 17   Ht 5' (1.524 m)   Wt 177 lb 3.2 oz (80.4 kg)   SpO2 100%   BMI 34.61 kg/m     General: Appears her stated age, niece, in NAD. Skin: Warm, dry and intact. No rashes, lesions or ulcerations noted. Cardiovascular: Normal rate..  Pulmonary/Chest: Normal effort. Abdomen: Soft and nontender.  Pelvic: Self swab Neurological: Alert and oriented.    BMET    Component Value Date/Time   NA 139 05/26/2020 0947   K 4.3 05/26/2020 0947   CL 107 05/26/2020 0947   CO2 22 05/26/2020 0947   GLUCOSE 81 05/26/2020 0947   BUN 11 05/26/2020 0947   CREATININE 0.95 05/26/2020 0947   CALCIUM 9.8 05/26/2020 0947   GFRNONAA 81 05/26/2020 0947   GFRAA 94 05/26/2020 0947    Lipid Panel  No results found for: CHOL, TRIG, HDL, CHOLHDL, VLDL, LDLCALC  CBC    Component Value Date/Time   WBC 4.9 05/26/2020 0947   RBC 4.28 05/26/2020 0947   HGB 14.6 05/26/2020 0947   HCT 43.2 05/26/2020 0947   PLT 258 05/26/2020 0947   MCV 100.9 (H) 05/26/2020 0947   MCH 34.1 (H) 05/26/2020 0947   MCHC 33.8 05/26/2020 0947   RDW 12.1 05/26/2020 0947   LYMPHSABS 2,141 05/26/2020 0947   EOSABS 20 05/26/2020 0947   BASOSABS 29 05/26/2020 0947    Hgb A1C No results found for: HGBA1C          Assessment & Plan:   Vaginal Discharge, Itching and Odor:  Self swab for gonorrhea, chlamydia, trichomoniasis, BV and yeast Will check HIV, RPR, Hep C and HSV 1&2 She uses boric acid suppositories intermittently  Will follow up after labs with further recommendations and treatment plan  Nicki Reaper, NP This visit occurred during the SARS-CoV-2 public health emergency.  Safety protocols were in place, including screening questions prior to the visit, additional usage of staff PPE, and  extensive cleaning of exam room while observing appropriate contact time as indicated for disinfecting solutions.

## 2021-04-28 ENCOUNTER — Encounter: Payer: Self-pay | Admitting: Internal Medicine

## 2021-04-29 LAB — CERVICOVAGINAL ANCILLARY ONLY
Bacterial Vaginitis (gardnerella): POSITIVE — AB
Candida Glabrata: NEGATIVE
Candida Vaginitis: POSITIVE — AB
Chlamydia: NEGATIVE
Comment: NEGATIVE
Comment: NEGATIVE
Comment: NEGATIVE
Comment: NEGATIVE
Comment: NEGATIVE
Comment: NORMAL
Neisseria Gonorrhea: NEGATIVE
Trichomonas: NEGATIVE

## 2021-04-30 LAB — HSV(HERPES SIMPLEX VRS) I + II AB-IGG
HAV 1 IGG,TYPE SPECIFIC AB: <0.9 index
HSV 2 IGG,TYPE SPECIFIC AB: 4.74 index — ABNORMAL HIGH

## 2021-04-30 LAB — HEPATITIS C ANTIBODY
Hepatitis C Ab: NONREACTIVE
SIGNAL TO CUT-OFF: 0.18 (ref <1.00)

## 2021-04-30 LAB — HSV 1/2 AB (IGM), IFA W/RFLX TITER
HSV 1 IgM Screen: NEGATIVE
HSV 2 IgM Screen: NEGATIVE

## 2021-04-30 LAB — RPR: RPR Ser Ql: NONREACTIVE

## 2021-04-30 LAB — HIV ANTIBODY (ROUTINE TESTING W REFLEX): HIV 1&2 Ab, 4th Generation: NONREACTIVE

## 2021-04-30 MED ORDER — METRONIDAZOLE 500 MG PO TABS: 500.0000 mg | ORAL_TABLET | Freq: Two times a day (BID) | ORAL | 0 refills | Status: DC

## 2021-04-30 MED ORDER — FLUCONAZOLE 150 MG PO TABS: 150.0000 mg | ORAL_TABLET | Freq: Once | ORAL | 0 refills | Status: AC

## 2021-04-30 NOTE — Addendum Note (Signed)
Addended by: Lorre Munroe on: 04/30/2021 07:50 AM   Modules accepted: Orders

## 2021-05-11 ENCOUNTER — Other Ambulatory Visit: Payer: Self-pay | Admitting: Internal Medicine

## 2021-05-11 NOTE — Telephone Encounter (Signed)
Requested medications are due for refill today yes (given two week rx)  Requested medications are on the active medication list yes  Last refill 04/27/21  Last visit 04/27/21  Future visit scheduled 10/14/21  Notes to clinic Unclear if pt was to only take for two weeks, please assess. Requested Prescriptions  Pending Prescriptions Disp Refills   naproxen (NAPROSYN) 500 MG tablet [Pharmacy Med Name: NAPROXEN 500 MG TABLET] 30 tablet 0    Sig: TAKE 1 TABLET BY MOUTH 2 TIMES DAILY WITH A MEAL.     Analgesics:  NSAIDS Passed - 05/11/2021 11:36 AM      Passed - Cr in normal range and within 360 days    Creat  Date Value Ref Range Status  05/26/2020 0.95 0.50 - 1.10 mg/dL Final          Passed - HGB in normal range and within 360 days    Hemoglobin  Date Value Ref Range Status  05/26/2020 14.6 11.7 - 15.5 g/dL Final          Passed - Patient is not pregnant      Passed - Valid encounter within last 12 months    Recent Outpatient Visits           2 weeks ago Vaginal discharge   [facility], Salvadore Oxford, NP   4 weeks ago Need for immunization against influenza   [facility] [place], [place] W, NP   4 months ago Abscess of multiple sites of buttock   [facility] [place], [place] W, NP   6 months ago Migraine without aura and without status migrainosus, not intractable   [facility] [place], [place] W, NP   6 months ago Migraine without aura and without status migrainosus, not intractable   [facility] [place], Salvadore Oxford, NP       Future Appointments             In 5 months Baity, Salvadore Oxford, NP [facility], [facility]            Signed Prescriptions Disp Refills   albuterol (VENTOLIN HFA) 108 (90 Base) MCG/ACT inhaler 8.5 each 1    Sig: INHALE 1 PUFF INTO THE LUNGS EVERY 6 HOURS AS NEEDED FOR WHEEZING OR SHORTNESS OF BREATH.     Pulmonology:  Beta Agonists Failed - 05/11/2021 11:36 AM       Failed - One inhaler should last at least one month. If the patient is requesting refills earlier, contact the patient to check for uncontrolled symptoms.      Passed - Valid encounter within last 12 months    Recent Outpatient Visits           2 weeks ago Vaginal discharge   [facility] [place], Salvadore Oxford, NP   4 weeks ago Need for immunization against influenza   [facility] [place], [place], NP   4 months ago Abscess of multiple sites of buttock   [facility] [place], [place] W, NP   6 months ago Migraine without aura and without status migrainosus, not intractable   [facility] [place], [place] W, NP   6 months ago Migraine without aura and without status migrainosus, not intractable   [facility] [place], Salvadore Oxford, NP       Future Appointments             In 5  months Lorre Munroe, NP [facility]

## 2021-05-11 NOTE — Telephone Encounter (Signed)
Requested Prescriptions  Pending Prescriptions Disp Refills  . albuterol (VENTOLIN HFA) 108 (90 Base) MCG/ACT inhaler [Pharmacy Med Name: ALBUTEROL HFA (PROAIR) INHALER] 8.5 each 1    Sig: INHALE 1 PUFF INTO THE LUNGS EVERY 6 HOURS AS NEEDED FOR WHEEZING OR SHORTNESS OF BREATH.     Pulmonology:  Beta Agonists Failed - 05/11/2021 11:36 AM      Failed - One inhaler should last at least one month. If the patient is requesting refills earlier, contact the patient to check for uncontrolled symptoms.      Passed - Valid encounter within last 12 months    Recent Outpatient Visits          2 weeks ago Vaginal discharge   [facility] [place], Salvadore Oxford, NP   4 weeks ago Need for immunization against influenza   [facility] [place], [place], NP   4 months ago Abscess of multiple sites of buttock   [facility] [place], [place], NP   6 months ago Migraine without aura and without status migrainosus, not intractable   [facility], [place], NP   6 months ago Migraine without aura and without status migrainosus, not intractable   [facility] [place], Salvadore Oxford, NP      Future Appointments            In 5 months Baity, Salvadore Oxford, NP [facility]           . naproxen (NAPROSYN) 500 MG tablet [Pharmacy Med Name: NAPROXEN 500 MG TABLET] 30 tablet 0    Sig: TAKE 1 TABLET BY MOUTH 2 TIMES DAILY WITH A MEAL.     Analgesics:  NSAIDS Passed - 05/11/2021 11:36 AM      Passed - Cr in normal range and within 360 days    Creat  Date Value Ref Range Status  05/26/2020 0.95 0.50 - 1.10 mg/dL Final         Passed - HGB in normal range and within 360 days    Hemoglobin  Date Value Ref Range Status  05/26/2020 14.6 11.7 - 15.5 g/dL Final         Passed - Patient is not pregnant      Passed - Valid encounter within last 12 months    Recent Outpatient Visits          2 weeks ago Vaginal discharge    [facility] [place], Salvadore Oxford, NP   4 weeks ago Need for immunization against influenza   [facility] [place], [place] W, NP   4 months ago Abscess of multiple sites of buttock   [facility] [place], [place] W, NP   6 months ago Migraine without aura and without status migrainosus, not intractable   [facility] [place], [place], NP   6 months ago Migraine without aura and without status migrainosus, not intractable   [facility] [place], Salvadore Oxford, NP      Future Appointments            In 5 months Baity, Salvadore Oxford, NP [facility], [facility]

## 2021-07-01 ENCOUNTER — Telehealth: Payer: Self-pay | Admitting: Internal Medicine

## 2021-07-01 ENCOUNTER — Other Ambulatory Visit: Payer: Self-pay | Admitting: Internal Medicine

## 2021-07-01 DIAGNOSIS — Z975 Presence of (intrauterine) contraceptive device: Secondary | ICD-10-CM

## 2021-07-01 NOTE — Telephone Encounter (Signed)
Patient needs appt for iud to be removed

## 2021-07-02 ENCOUNTER — Ambulatory Visit: Payer: Medicare Other | Admitting: Internal Medicine

## 2021-07-02 ENCOUNTER — Telehealth: Payer: Self-pay

## 2021-07-02 ENCOUNTER — Other Ambulatory Visit: Payer: Self-pay | Admitting: Internal Medicine

## 2021-07-02 NOTE — Telephone Encounter (Signed)
Requested Prescriptions  Pending Prescriptions Disp Refills   buPROPion (WELLBUTRIN SR) 150 MG 12 hr tablet [Pharmacy Med Name: BUPROPION HCL SR 150 MG TABLET] 180 tablet 0    Sig: TAKE 1 TAB BY MOUTH DAILY IN AM X 3 DAYS THEN INCREASE TO 1 TAB BY MOUTH TWICE A DAY THEREAFTER     Psychiatry: Antidepressants - bupropion Passed - 07/02/2021 11:28 AM      Passed - Last BP in normal range    BP Readings from Last 1 Encounters:  04/27/21 106/72         Passed - Valid encounter within last 6 months    Recent Outpatient Visits          2 months ago Vaginal discharge   [facility] [place], Salvadore Oxford, NP   2 months ago Need for immunization against influenza   [facility] [place], [place], NP   6 months ago Abscess of multiple sites of buttock   [facility] [place], [place], NP   8 months ago Migraine without aura and without status migrainosus, not intractable   [facility] [place], [place] W, NP   8 months ago Migraine without aura and without status migrainosus, not intractable   [facility] [place], Salvadore Oxford, NP      Future Appointments            In 3 months Baity, Salvadore Oxford, NP [facility], [facility]

## 2021-07-02 NOTE — Telephone Encounter (Signed)
Pt is scheduled with ABC on 1/17 at 9:35.

## 2021-07-02 NOTE — Telephone Encounter (Signed)
SGM referring for IUD removal. Called and left voicemail for patient to call back to be scheduled.

## 2021-07-02 NOTE — Progress Notes (Signed)
[name], NP   Chief Complaint  Patient presents with   IUD removal    Feels depressed, fatigue, weight gain, pain during intercourse at times, possible new BC    HPI:      [name] is a 31 y.o. G0P0000 whose LMP was No LMP recorded. (Menstrual status: IUD)., presents today for IUD removal and [facility] consult. Mirena placed ~10/2017. Pt has rare light bleeding for 1-2 days with mild dysmen, improved with NSAIDs. She is sex active, no bleeding. Has occas RLQ pain with intercourse (same sx as when seen 12/20 with IUD in correct location on 12/20 u/s with 3 cm LTO complex cyst (f/u imaging not needed per guidelines). Pt would like to change to OCPs. Hx of migraines with aura last yr; no hx of HTN, DVTs. Did OCPs in distant past. Due for pap smear. Hx of abn paps in 20s that were just repeated, no tx.  Hx of recurrent BV; last tx 11/22.    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   Bipolar 1 disorder (Fate) 04/02/2019   Migraine without aura and without status migrainosus, not intractable 04/02/2019   Mild intermittent asthma without complication 0000000    Past Surgical History:  Procedure Laterality Date   WISDOM TOOTH EXTRACTION      Family History  Problem Relation Age of Onset   Depression Mother    Alcohol abuse Mother    Bipolar disorder Mother     Social History   Socioeconomic History   Marital status: Single    Spouse name: Not on file   Number of children: Not on file   Years of education: Not on file   Highest education level: Not on file  Occupational History   Occupation: disabled    Comment: PTSD  Tobacco Use   Smoking  status: Every Day    Packs/day: 0.50    Years: 10.00    Pack years: 5.00    Types: Cigarettes   Smokeless tobacco: Never  Vaping Use   Vaping Use: Former   Substances: THC  Substance and Sexual Activity   Alcohol use: Yes    Alcohol/week: 3.0 standard drinks    Types: 3 Cans of beer per week    Comment: 3 beers weekly   Drug use: Yes    Types: Marijuana   Sexual activity: Yes    Partners: Male    Birth control/protection: I.U.D.    Comment: Mirena  Other Topics Concern   Not on file  Social History Narrative   Not on file   Social Determinants of Health   Financial Resource Strain: Not on file  Food Insecurity: Not on file  Transportation Needs: Not on file  Physical Activity: Not on file  Stress: Not on file  Social Connections: Not on file  Intimate Partner Violence: Not on file    Outpatient Medications Prior to Visit  Medication Sig Dispense Refill   albuterol (VENTOLIN HFA) 108 (90 Base) MCG/ACT inhaler INHALE 1 PUFF INTO THE LUNGS EVERY 6 HOURS AS NEEDED FOR WHEEZING OR SHORTNESS OF BREATH. 8.5 each 1   busPIRone (BUSPAR) 10 MG tablet TAKE 1 TABLET BY MOUTH  2 TO 3 TIMES DIALY AS NEEDED FOR ANXIETY     clonazePAM (KLONOPIN) 1 MG tablet Take 1 tablet by mouth 2 (two) times daily.     hydrOXYzine (ATARAX/VISTARIL) 50 MG tablet Take 100 mg by mouth daily.      levonorgestrel (MIRENA) 20 MCG/24HR IUD 1 each by Intrauterine route once.     naproxen (NAPROSYN) 500 MG tablet TAKE 1 TABLET BY MOUTH 2 TIMES DAILY WITH A MEAL. 30 tablet 0   ABILIFY MAINTENA 300 MG PRSY prefilled syringe SMARTSIG:1 Syringe(s) IM Every 4 Weeks     buPROPion (WELLBUTRIN SR) 150 MG 12 hr tablet TAKE 1 TAB BY MOUTH DAILY IN AM X 3 DAYS THEN INCREASE TO 1 TAB BY MOUTH TWICE A DAY THEREAFTER 180 tablet 0   metroNIDAZOLE (FLAGYL) 500 MG tablet Take 1 tablet (500 mg total) by mouth 2 (two) times daily. One po bid x 7 days 14 tablet 0   No facility-administered medications prior to visit.       ROS:  Review of Systems  Constitutional:  Negative for fever.  Gastrointestinal:  Negative for blood in stool, constipation, diarrhea, nausea and vomiting.  Genitourinary:  Negative for dyspareunia, dysuria, flank pain, frequency, hematuria, urgency, vaginal bleeding, vaginal discharge and vaginal pain.  Musculoskeletal:  Negative for back pain.  Skin:  Negative for rash.  BREAST: No symptoms   OBJECTIVE:   Vitals:  BP 120/80    Ht 5' (1.524 m)    Wt 177 lb (80.3 kg)    BMI 34.57 kg/m   Physical Exam Vitals reviewed.  Constitutional:      Appearance: She is well-developed.  Pulmonary:     Effort: Pulmonary effort is normal.  Genitourinary:    General: Normal vulva.     Pubic Area: No rash.      Labia:        Right: No rash, tenderness or lesion.        Left: No rash, tenderness or lesion.      Vagina: Normal. No vaginal discharge, erythema or tenderness.     Cervix: Normal.     Uterus: Normal. Not enlarged and not tender.      Adnexa: Right adnexa normal and left adnexa normal.       Right: No mass or tenderness.         Left: No mass or tenderness.       Comments: IUD STRINGS NOT IN CX OS Musculoskeletal:        General: Normal range of motion.     Cervical back: Normal range of motion.  Skin:    General: Skin is warm and dry.  Neurological:     General: No focal deficit present.     Mental Status: She is alert and oriented to person, place, and time.  Psychiatric:        Mood and Affect: Mood normal.        Behavior: Behavior normal.        Thought Content: Thought content normal.        Judgment: Judgment normal.   IUD Removal Strings of IUD identified and grasped.  IUD removed without problem with ring forceps.  Pt tolerated this well.  IUD noted to be intact.  Assessment/Plan: Encounter for initial prescription of contraceptive pills - Plan: norethindrone (MICRONOR) 0.35 MG tablet; prog only BC options discussed. Rx camila, start today, condoms for  1 wk. Pt aware of need to use condoms if more than 3 hrs late  with pills. Rx eRxd. F/u prn.   Cervical cancer screening - Plan: Cytology - PAP  Screening for HPV (human papillomavirus) - Plan: Cytology - PAP  Encounter for IUD removal--tolerated well.    Meds ordered this encounter  Medications   norethindrone (MICRONOR) 0.35 MG tablet    Sig: Take 1 tablet (0.35 mg total) by mouth daily.    Dispense:  84 tablet    Refill:  3    Order Specific Question:   Supervising Provider    Answer:   Gae Dry [id number]      Return if symptoms worsen or fail to improve.  Ellen Emme B. Daiwik [place], PA-C 07/07/2021 10:15 AM

## 2021-07-07 ENCOUNTER — Encounter: Payer: Self-pay | Admitting: Obstetrics and Gynecology

## 2021-07-07 ENCOUNTER — Ambulatory Visit (INDEPENDENT_AMBULATORY_CARE_PROVIDER_SITE_OTHER): Payer: Medicare Other | Admitting: Obstetrics and Gynecology

## 2021-07-07 ENCOUNTER — Other Ambulatory Visit: Payer: Self-pay | Admitting: Internal Medicine

## 2021-07-07 ENCOUNTER — Other Ambulatory Visit (HOSPITAL_COMMUNITY)
Admission: RE | Admit: 2021-07-07 | Discharge: 2021-07-07 | Disposition: A | Discharge status: Home / Self Care | Payer: Medicare Other | Source: Ambulatory Visit | Attending: Obstetrics and Gynecology | Admitting: Obstetrics and Gynecology

## 2021-07-07 ENCOUNTER — Other Ambulatory Visit: Payer: Self-pay

## 2021-07-07 VITALS — BP 120/80 | Ht 60.0 in | Wt 177.0 lb

## 2021-07-07 DIAGNOSIS — Z1151 Encounter for screening for human papillomavirus (HPV): Secondary | ICD-10-CM

## 2021-07-07 DIAGNOSIS — Z01419 Encounter for gynecological examination (general) (routine) without abnormal findings: Secondary | ICD-10-CM | POA: Insufficient documentation

## 2021-07-07 DIAGNOSIS — Z124 Encounter for screening for malignant neoplasm of cervix: Secondary | ICD-10-CM | POA: Insufficient documentation

## 2021-07-07 DIAGNOSIS — Z30432 Encounter for removal of intrauterine contraceptive device: Secondary | ICD-10-CM | POA: Diagnosis not present

## 2021-07-07 DIAGNOSIS — Z30011 Encounter for initial prescription of contraceptive pills: Secondary | ICD-10-CM

## 2021-07-07 MED ORDER — NORETHINDRONE 0.35 MG PO TABS: 1.0000 | ORAL_TABLET | Freq: Every day | ORAL | 3 refills | Status: DC

## 2021-07-07 NOTE — Telephone Encounter (Signed)
Requested by interface surescripts. Discontinued medication 04/13/21 Requested Prescriptions  Refused Prescriptions Disp Refills   cyclobenzaprine (FLEXERIL) 10 MG tablet [Pharmacy Med Name: CYCLOBENZAPRINE 10 MG TABLET] 30 tablet 0    Sig: TAKE 1 TABLET BY MOUTH THREE TIMES A DAY AS NEEDED FOR MUSCLE SPASMS     Not Delegated - Analgesics:  Muscle Relaxants Failed - 07/07/2021 10:15 AM      Failed - This refill cannot be delegated      Passed - Valid encounter within last 6 months    Recent Outpatient Visits          2 months ago Vaginal discharge   [facility] [place], Salvadore Oxford, NP   2 months ago Need for immunization against influenza   [facility] [place], [place], NP   6 months ago Abscess of multiple sites of buttock   [facility] [place], [place] W, NP   8 months ago Migraine without aura and without status migrainosus, not intractable   [facility] [place], [place] W, NP   8 months ago Migraine without aura and without status migrainosus, not intractable   [facility] [place], Salvadore Oxford, NP      Future Appointments            In 3 months Baity, Salvadore Oxford, NP [facility], [facility]

## 2021-07-07 NOTE — Patient Instructions (Signed)
I value your feedback and you entrusting us with your care. If you get a [facility] patient survey, I would appreciate you taking the time to let us know about your experience today. Thank you! ? ? ?

## 2021-07-08 LAB — CYTOLOGY - PAP
Comment: NEGATIVE
Diagnosis: NEGATIVE
High risk HPV: NEGATIVE

## 2021-08-05 ENCOUNTER — Ambulatory Visit (INDEPENDENT_AMBULATORY_CARE_PROVIDER_SITE_OTHER): Payer: Medicare Other | Admitting: Internal Medicine

## 2021-08-05 ENCOUNTER — Other Ambulatory Visit (HOSPITAL_COMMUNITY)
Admission: RE | Admit: 2021-08-05 | Discharge: 2021-08-05 | Disposition: A | Discharge status: Home / Self Care | Payer: Medicare Other | Source: Ambulatory Visit | Attending: Internal Medicine | Admitting: Internal Medicine

## 2021-08-05 ENCOUNTER — Encounter: Payer: Self-pay | Admitting: Internal Medicine

## 2021-08-05 ENCOUNTER — Other Ambulatory Visit: Payer: Self-pay

## 2021-08-05 VITALS — BP 110/69 | HR 54 | Temp 97.5°F | Wt 178.0 lb

## 2021-08-05 DIAGNOSIS — N898 Other specified noninflammatory disorders of vagina: Secondary | ICD-10-CM | POA: Insufficient documentation

## 2021-08-05 DIAGNOSIS — Z113 Encounter for screening for infections with a predominantly sexual mode of transmission: Secondary | ICD-10-CM

## 2021-08-05 NOTE — Patient Instructions (Signed)
Vaginitis Vaginitis is irritation and swelling of the vagina. Treatment will depend on the cause. What are the causes? It can be caused by: Bacteria. Yeast. A parasite. A virus. Low hormone levels. Bubble baths, scented tampons, and feminine sprays. Other things can change the balance of the yeast and bacteria that live in the vagina. These include: Antibiotic medicines. Not being clean enough. Some birth control methods. Sex. Infection. Diabetes. A weakened body defense system (immune system). What increases the risk? Smoking or being around someone who smokes. Using washes (douches), scented tampons, or scented pads. Wearing tight pants or thong underwear. Using birth control pills or an IUD. Having sex without a condom or having a lot of partners. Having an STI. Using a certain product to kill sperm (nonoxynol-9). Eating foods that are high in sugar. Having diabetes. Having low levels of a female hormone. Having a weakened body defense system. Being pregnant or breastfeeding. What are the signs or symptoms? Fluid coming from the vagina that is not normal. A bad smell. Itching, pain, or swelling. Pain with sex. Pain or burning when you pee (urinate). Sometimes there are no symptoms. How is this treated? Treatment may include: Antibiotic creams or pills. Antifungal medicines. Medicines to ease symptoms if you have a virus. Your sex partner should also be treated. Estrogen medicines. Avoiding scented soaps, sprays, or douches. Stopping use of products that caused irritation and then using a cream to treat symptoms. Follow these instructions at home: Lifestyle Keep the area around your vagina clean and dry. Avoid using soap. Rinse the area with water. Until your doctor says it is okay: Do not use washes for the vagina. Do not use tampons. Do not have sex. Wipe from front to back after going to the bathroom. When your doctor says it is okay, practice safe sex and  use condoms. General instructions Take over-the-counter and prescription medicines only as told by your doctor. If you were prescribed an antibiotic medicine, take or use it as told by your doctor. Do not stop taking or using it even if you start to feel better. Keep all follow-up visits. How is this prevented? Do not use things that can irritate the vagina, such as fabric softeners. Avoid these products if they are scented: Sprays. Detergents. Tampons. Products for cleaning the vagina. Soaps or bubble baths. Let air reach your vagina. To do this: Wear cotton underwear. Do not wear: Underwear while you sleep. Tight pants. Thong underwear. Underwear or nylons without a cotton panel. Take off any wet clothing, such as bathing suits, as soon as you can. Practice safe sex and use condoms. Contact a doctor if: You have pain in your belly or in the area between your hips. You have a fever or chills. Your symptoms last for more than 2-3 days. Get help right away if: You have a fever and your symptoms get worse all of a sudden. Summary Vaginitis is irritation and swelling of the vagina. Treatment will depend on the cause of the condition. Do not use washes or tampons or have sex until your doctor says it is okay. This information is not intended to replace advice given to you by your health care provider. Make sure you discuss any questions you have with your health care provider. Document Revised: 12/06/2019 Document Reviewed: 12/06/2019 Elsevier Patient Education  2022 Elsevier Inc.  

## 2021-08-05 NOTE — Progress Notes (Signed)
Subjective:    Patient ID: Ellen Kaiser, female    DOB: 29-Nov-1990, 31 y.o.   MRN: [id number]  HPI  Patient presents the clinic today with complaint of vaginal discharge. She noticed this 1 week ago. The discharge is clear in color, but she reports it has a chemical smell.  She does have some associated itching but denies pelvic pain, abnormal uterine bleeding, urinary urgency, frequency, dysuria or blood in her urine.  She reports she frequently gets BV.  She tried a boric acid suppository with minimal relief of symptoms.  She does not think she has an STD but would like screening today.  Review of Systems     Past Medical History:  Diagnosis Date   Allergy    Anxiety    Asthma    Bipolar 1 disorder (HCC)    BV (bacterial vaginosis)    Depression    HSV-2 seropositive     Current Outpatient Medications  Medication Sig Dispense Refill   albuterol (VENTOLIN HFA) 108 (90 Base) MCG/ACT inhaler INHALE 1 PUFF INTO THE LUNGS EVERY 6 HOURS AS NEEDED FOR WHEEZING OR SHORTNESS OF BREATH. 8.5 each 1   busPIRone (BUSPAR) 10 MG tablet TAKE 1 TABLET BY MOUTH 2 TO 3 TIMES DIALY AS NEEDED FOR ANXIETY     clonazePAM (KLONOPIN) 1 MG tablet Take 1 tablet by mouth 2 (two) times daily.     hydrOXYzine (ATARAX/VISTARIL) 50 MG tablet Take 100 mg by mouth daily.      levonorgestrel (MIRENA) 20 MCG/24HR IUD 1 each by Intrauterine route once.     naproxen (NAPROSYN) 500 MG tablet TAKE 1 TABLET BY MOUTH 2 TIMES DAILY WITH A MEAL. 30 tablet 0   norethindrone (MICRONOR) 0.35 MG tablet Take 1 tablet (0.35 mg total) by mouth daily. 84 tablet 3   No current facility-administered medications for this visit.    Allergies  Allergen Reactions   Fish Allergy Anaphylaxis and Swelling    Family History  Problem Relation Age of Onset   Depression Mother    Alcohol abuse Mother    Bipolar disorder Mother     Social History   Socioeconomic History   Marital status: Single    Spouse name: Not on  file   Number of children: Not on file   Years of education: Not on file   Highest education level: Not on file  Occupational History   Occupation: disabled    Comment: PTSD  Tobacco Use   Smoking status: Every Day    Packs/day: 0.50    Years: 10.00    Pack years: 5.00    Types: Cigarettes   Smokeless tobacco: Never  Vaping Use   Vaping Use: Former   Substances: THC  Substance and Sexual Activity   Alcohol use: Yes    Alcohol/week: 3.0 standard drinks    Types: 3 Cans of beer per week    Comment: 3 beers weekly   Drug use: Yes    Types: Marijuana   Sexual activity: Yes    Partners: Male    Birth control/protection: I.U.D.    Comment: Mirena  Other Topics Concern   Not on file  Social History Narrative   Not on file   Social Determinants of Health   Financial Resource Strain: Not on file  Food Insecurity: Not on file  Transportation Needs: Not on file  Physical Activity: Not on file  Stress: Not on file  Social Connections: Not on file  Intimate Partner Violence: Not  on file     Constitutional: Denies fever, malaise, fatigue, headache or abrupt weight changes.  Respiratory: Denies difficulty breathing, shortness of breath, cough or sputum production.   Cardiovascular: Denies chest pain, chest tightness, palpitations or swelling in the hands or feet.  Gastrointestinal: Denies abdominal pain, bloating, constipation, diarrhea or blood in the stool.  GU: Patient reports vaginal discharge and other.  Denies urgency, frequency, pain with urination, burning sensation, blood in urine.  No other specific complaints in a complete review of systems (except as listed in HPI above).  Objective:   Physical Exam  BP 110/69 (BP Location: Right Arm, Patient Position: Sitting, Cuff Size: Large)    Pulse (!) 54    Temp (!) 97.5 F (36.4 C) (Temporal)    Wt 178 lb (80.7 kg)    SpO2 100%    BMI 34.76 kg/m   Wt Readings from Last 3 Encounters:  07/07/21 177 lb (80.3 kg)   04/27/21 177 lb 3.2 oz (80.4 kg)  04/13/21 177 lb (80.3 kg)    General: Appears her stated age, obese, in NAD. Cardiovascular: Bradycardic with normal rhythm. S1,S2 noted.  No murmur, rubs or gallops noted.  Pulmonary/Chest: Normal effort and positive vesicular breath sounds. No respiratory distress. No wheezes, rales or ronchi noted.  Abdomen: Soft and nontender.  Musculoskeletal:  No difficulty with gait.  Neurological: Alert and oriented.   BMET    Component Value Date/Time   NA 139 05/26/2020 0947   K 4.3 05/26/2020 0947   CL 107 05/26/2020 0947   CO2 22 05/26/2020 0947   GLUCOSE 81 05/26/2020 0947   BUN 11 05/26/2020 0947   CREATININE 0.95 05/26/2020 0947   CALCIUM 9.8 05/26/2020 0947   GFRNONAA 81 05/26/2020 0947   GFRAA 94 05/26/2020 0947    Lipid Panel  No results found for: CHOL, TRIG, HDL, CHOLHDL, VLDL, LDLCALC  CBC    Component Value Date/Time   WBC 4.9 05/26/2020 0947   RBC 4.28 05/26/2020 0947   HGB 14.6 05/26/2020 0947   HCT 43.2 05/26/2020 0947   PLT 258 05/26/2020 0947   MCV 100.9 (H) 05/26/2020 0947   MCH 34.1 (H) 05/26/2020 0947   MCHC 33.8 05/26/2020 0947   RDW 12.1 05/26/2020 0947   LYMPHSABS 2,141 05/26/2020 0947   EOSABS 20 05/26/2020 0947   BASOSABS 29 05/26/2020 0947    Hgb A1C No results found for: HGBA1C           Assessment & Plan:   Vaginal Discharge, Odor, Screen for STD:  Urinalysis: Normal Self swab, will check for gonorrhea, chlamydia, trichomonas, BV and yeast We will check HIV, RPR and hep C today  We will follow-up after labs with further recommendation and treatment plan Nicki Reaper, NP This visit occurred during the SARS-CoV-2 public health emergency.  Safety protocols were in place, including screening questions prior to the visit, additional usage of staff PPE, and extensive cleaning of exam room while observing appropriate contact time as indicated for disinfecting solutions.

## 2021-08-06 LAB — HIV ANTIBODY (ROUTINE TESTING W REFLEX): HIV 1&2 Ab, 4th Generation: NONREACTIVE

## 2021-08-06 LAB — CERVICOVAGINAL ANCILLARY ONLY
Bacterial Vaginitis (gardnerella): NEGATIVE
Candida Glabrata: NEGATIVE
Candida Vaginitis: POSITIVE — AB
Chlamydia: NEGATIVE
Comment: NEGATIVE
Comment: NEGATIVE
Comment: NEGATIVE
Comment: NEGATIVE
Comment: NEGATIVE
Comment: NORMAL
Neisseria Gonorrhea: NEGATIVE
Trichomonas: NEGATIVE

## 2021-08-06 LAB — HEPATITIS C ANTIBODY
Hepatitis C Ab: NONREACTIVE
SIGNAL TO CUT-OFF: 0.1 (ref <1.00)

## 2021-08-06 LAB — RPR: RPR Ser Ql: NONREACTIVE

## 2021-08-06 MED ORDER — FLUCONAZOLE 150 MG PO TABS: 150.0000 mg | ORAL_TABLET | Freq: Once | ORAL | 0 refills | Status: AC

## 2021-08-06 NOTE — Addendum Note (Signed)
Addended by: Lorre Munroe on: 08/06/2021 11:59 AM   Modules accepted: Orders

## 2021-08-23 ENCOUNTER — Other Ambulatory Visit: Payer: Self-pay | Admitting: Internal Medicine

## 2021-08-24 NOTE — Telephone Encounter (Signed)
Requested Prescriptions  ?Pending Prescriptions Disp Refills  ?? albuterol (VENTOLIN HFA) 108 (90 Base) MCG/ACT inhaler [Pharmacy Med Name: ALBUTEROL HFA (PROAIR) INHALER] 8.5 each 1  ?  Sig: INHALE 1 PUFF INTO THE LUNGS EVERY 6 HOURS AS NEEDED FOR WHEEZING OR SHORTNESS OF BREATH.  ?  ? Pulmonology:  Beta Agonists 2 Passed - 08/23/2021  1:12 AM  ?  ?  Passed - Last BP in normal range  ?  BP Readings from Last 1 Encounters:  ?08/05/21 110/69  ?   ?  ?  Passed - Last Heart Rate in normal range  ?  Pulse Readings from Last 1 Encounters:  ?08/05/21 (!) 54  ?   ?  ?  Passed - Valid encounter within last 12 months  ?  Recent Outpatient Visits   ?      ? 2 weeks ago Vaginal discharge  ? [facility] [place], Coralie Keens, NP  ? 3 months ago Vaginal discharge  ? [facility] [place], [place] W, NP  ? 4 months ago Need for immunization against influenza  ? [facility] [place], [place] W, NP  ? 7 months ago Abscess of multiple sites of buttock  ? [facility] [place], [place] W, NP  ? 9 months ago Migraine without aura and without status migrainosus, not intractable  ? [facility] [place], Coralie Keens, NP  ?  ?  ?Future Appointments   ?        ? In 1 month Baity, Coralie Keens, NP [facility], [place]  ?  ? ?  ?  ?  ? ?

## 2021-09-02 ENCOUNTER — Other Ambulatory Visit: Payer: Self-pay

## 2021-09-02 ENCOUNTER — Encounter: Payer: Self-pay | Admitting: Internal Medicine

## 2021-09-02 ENCOUNTER — Ambulatory Visit (INDEPENDENT_AMBULATORY_CARE_PROVIDER_SITE_OTHER): Payer: Medicare Other | Admitting: Internal Medicine

## 2021-09-02 VITALS — BP 102/68 | HR 60 | Temp 97.3°F | Wt 176.0 lb

## 2021-09-02 DIAGNOSIS — L739 Follicular disorder, unspecified: Secondary | ICD-10-CM

## 2021-09-02 DIAGNOSIS — L732 Hidradenitis suppurativa: Secondary | ICD-10-CM | POA: Diagnosis not present

## 2021-09-02 MED ORDER — DOXYCYCLINE HYCLATE 100 MG PO TABS: 100.0000 mg | ORAL_TABLET | Freq: Two times a day (BID) | ORAL | 0 refills | Status: DC

## 2021-09-02 NOTE — Progress Notes (Signed)
? ?Subjective:  ? ? Patient ID: Ellen Kaiser, female    DOB: 08-25-1990, 31 y.o.   MRN: [id number] ? ?HPI ? ?Pt presents to the clinic today with c/o a bump in her vaginal area. She noticed this 1 week ago. She reports the area has drained pus and blood. The area is tender to touch. She denies fever, chills, nausea or vomiting She does have a history of Hidradenitis Suppurativa. ? ? ? ?Review of Systems ? ?   ?Past Medical History:  ?Diagnosis Date  ? Allergy   ? Anxiety   ? Asthma   ? Bipolar 1 disorder (HCC)   ? BV (bacterial vaginosis)   ? Depression   ? HSV-2 seropositive   ? ? ?Current Outpatient Medications  ?Medication Sig Dispense Refill  ? albuterol (VENTOLIN HFA) 108 (90 Base) MCG/ACT inhaler INHALE 1 PUFF INTO THE LUNGS EVERY 6 HOURS AS NEEDED FOR WHEEZING OR SHORTNESS OF BREATH. 8.5 each 1  ? busPIRone (BUSPAR) 10 MG tablet TAKE 1 TABLET BY MOUTH 2 TO 3 TIMES DIALY AS NEEDED FOR ANXIETY    ? clonazePAM (KLONOPIN) 1 MG tablet Take 1 tablet by mouth 2 (two) times daily.    ? hydrOXYzine (ATARAX/VISTARIL) 50 MG tablet Take 100 mg by mouth daily.     ? naproxen (NAPROSYN) 500 MG tablet TAKE 1 TABLET BY MOUTH 2 TIMES DAILY WITH A MEAL. 30 tablet 0  ? norethindrone (MICRONOR) 0.35 MG tablet Take 1 tablet (0.35 mg total) by mouth daily. (Patient not taking: Reported on 08/05/2021) 84 tablet 3  ? ?No current facility-administered medications for this visit.  ? ? ?Allergies  ?Allergen Reactions  ? Fish Allergy Anaphylaxis and Swelling  ? ? ?Family History  ?Problem Relation Age of Onset  ? Depression Mother   ? Alcohol abuse Mother   ? Bipolar disorder Mother   ? ? ?Social History  ? ?Socioeconomic History  ? Marital status: Single  ?  Spouse name: Not on file  ? Number of children: Not on file  ? Years of education: Not on file  ? Highest education level: Not on file  ?Occupational History  ? Occupation: disabled  ?  Comment: PTSD  ?Tobacco Use  ? Smoking status: Every Day  ?  Packs/day: 0.50  ?  Years:  10.00  ?  Pack years: 5.00  ?  Types: Cigarettes  ? Smokeless tobacco: Never  ?Vaping Use  ? Vaping Use: Former  ? Substances: THC  ?Substance and Sexual Activity  ? Alcohol use: Yes  ?  Alcohol/week: 3.0 standard drinks  ?  Types: 3 Cans of beer per week  ?  Comment: 3 beers weekly  ? Drug use: Yes  ?  Types: Marijuana  ? Sexual activity: Yes  ?  Partners: Male  ?  Birth control/protection: I.U.D.  ?  Comment: Mirena  ?Other Topics Concern  ? Not on file  ?Social History Narrative  ? Not on file  ? ?Social Determinants of Health  ? ?Financial Resource Strain: Not on file  ?Food Insecurity: Not on file  ?Transportation Needs: Not on file  ?Physical Activity: Not on file  ?Stress: Not on file  ?Social Connections: Not on file  ?Intimate Partner Violence: Not on file  ? ? ? ?Constitutional: Denies fever, malaise, fatigue, headache or abrupt weight changes.  ?Respiratory: Denies difficulty breathing, shortness of breath, cough or sputum production.   ?Cardiovascular: Denies chest pain, chest tightness, palpitations or swelling in the hands or  feet.  ?Skin: Pt reports bump in her vaginal area. Denies redness, rashes, or ulcercations.  ? ?No other specific complaints in a complete review of systems (except as listed in HPI above). ? ?Objective:  ? Physical Exam ? ?BP 102/68 (BP Location: Left Arm, Patient Position: Sitting, Cuff Size: Large)   Pulse 60   Temp (!) 97.3 ?F (36.3 ?C) (Temporal)   Wt 176 lb (79.8 kg)   SpO2 99%   BMI 34.37 kg/m?  ? ?Wt Readings from Last 3 Encounters:  ?08/05/21 178 lb (80.7 kg)  ?07/07/21 177 lb (80.3 kg)  ?04/27/21 177 lb 3.2 oz (80.4 kg)  ? ? ?General: Appears her stated age, obese, in NAD. ?Skin: Folliculitis noted in the suprapubic area.noted.  ?Cardiovascular: Normal rat. ?Pulmonary/Chest: Normal effort.  ?Neurological: Alert and oriented.  ? ?BMET ?   ?Component Value Date/Time  ? NA 139 05/26/2020 0947  ? K 4.3 05/26/2020 0947  ? CL 107 05/26/2020 0947  ? CO2 22 05/26/2020 0947   ? GLUCOSE 81 05/26/2020 0947  ? BUN 11 05/26/2020 0947  ? CREATININE 0.95 05/26/2020 0947  ? CALCIUM 9.8 05/26/2020 0947  ? GFRNONAA 81 05/26/2020 0947  ? GFRAA 94 05/26/2020 0947  ? ? ?Lipid Panel  ?No results found for: CHOL, TRIG, HDL, CHOLHDL, VLDL, LDLCALC ? ?CBC ?   ?Component Value Date/Time  ? WBC 4.9 05/26/2020 0947  ? RBC 4.28 05/26/2020 0947  ? HGB 14.6 05/26/2020 0947  ? HCT 43.2 05/26/2020 0947  ? PLT 258 05/26/2020 0947  ? MCV 100.9 (H) 05/26/2020 0947  ? MCH 34.1 (H) 05/26/2020 0947  ? MCHC 33.8 05/26/2020 0947  ? RDW 12.1 05/26/2020 0947  ? LYMPHSABS 2,141 05/26/2020 0947  ? EOSABS 20 05/26/2020 0947  ? BASOSABS 29 05/26/2020 0947  ? ? ?Hgb A1C ?No results found for: HGBA1C ? ? ? ? ? ?   ?Assessment & Plan:  ? ?Folliculitis/Hidradenitis: ? ?RX for Doxycyline 100 mg BID x 7 days ?Avoid shaving or waxing for now ?Warm compresses 3 x day ? ?Return precautions discussed ? ? ?Nicki Reaper, NP ?This visit occurred during the SARS-CoV-2 public health emergency.  Safety protocols were in place, including screening questions prior to the visit, additional usage of staff PPE, and extensive cleaning of exam room while observing appropriate contact time as indicated for disinfecting solutions.  ? ?

## 2021-09-03 ENCOUNTER — Encounter: Payer: Self-pay | Admitting: Internal Medicine

## 2021-09-03 NOTE — Patient Instructions (Signed)
Folliculitis Folliculitis is inflammation of the hair follicles. Folliculitis most commonly occurs on the scalp, thighs, legs, back, and buttocks. However, it can occur anywhere on the body. What are the causes? This condition may be caused by: A bacterial infection (common). A fungal infection. A viral infection. Contact with certain chemicals, especially oils and tars. Shaving or waxing. Greasy ointments or creams applied to the skin. Long-lasting folliculitis and folliculitis that keeps coming back may be caused by bacteria. This bacteria can live anywhere on your skin and is often found in the nostrils. What increases the risk? You are more likely to develop this condition if you have: A weakened immune system. Diabetes. Obesity. What are the signs or symptoms? Symptoms of this condition include: Redness. Soreness. Swelling. Itching. Small white or yellow, pus-filled, itchy spots (pustules) that appear over a reddened area. If there is an infection that goes deep into the follicle, these may develop into a boil (furuncle). A group of closely packed boils (carbuncle). These tend to form in hairy, sweaty areas of the body. How is this diagnosed? This condition is diagnosed with a skin exam. To find what is causing the condition, your health care provider may take a sample of one of the pustules or boils for testing in a lab. How is this treated? This condition may be treated by: Applying warm compresses to the affected areas. Taking an antibiotic medicine or applying an antibiotic medicine to the skin. Applying or bathing with an antiseptic solution. Taking an over-the-counter medicine to help with itching. Having a procedure to drain any pustules or boils. This may be done if a pustule or boil contains a lot of pus or fluid. Having laser hair removal. This may be done to treat long-lasting folliculitis. Follow these instructions at home: Managing pain and swelling  If  directed, apply heat to the affected area as often as told by your health care provider. Use the heat source that your health care provider recommends, such as a moist heat pack or a heating pad. Place a towel between your skin and the heat source. Leave the heat on for 20-30 minutes. Remove the heat if your skin turns bright red. This is especially important if you are unable to feel pain, heat, or cold. You may have a greater risk of getting burned. General instructions If you were prescribed an antibiotic medicine, take it or apply it as told by your health care provider. Do not stop using the antibiotic even if your condition improves. Check the irritated area every day for signs of infection. Check for: Redness, swelling, or pain. Fluid or blood. Warmth. Pus or a bad smell. Do not shave irritated skin. Take over-the-counter and prescription medicines only as told by your health care provider. Keep all follow-up visits as told by your health care provider. This is important. Get help right away if: You have more redness, swelling, or pain in the affected area. Red streaks are spreading from the affected area. You have a fever. Summary Folliculitis is inflammation of the hair follicles. Folliculitis most commonly occurs on the scalp, thighs, legs, back, and buttocks. This condition may be treated by taking an antibiotic medicine or applying an antibiotic medicine to the skin, and applying or bathing with an antiseptic solution. If you were prescribed an antibiotic medicine, take it or apply it as told by your health care provider. Do not stop using the antibiotic even if your condition improves. Get help right away if you have new or   worsening symptoms. Keep all follow-up visits as told by your health care provider. This is important. This information is not intended to replace advice given to you by your health care provider. Make sure you discuss any questions you have with your health  care provider. Document Revised: 01/06/2018 Document Reviewed: 01/14/2018 Elsevier Patient Education  2022 [employer]  

## 2021-09-07 ENCOUNTER — Other Ambulatory Visit: Payer: Self-pay

## 2021-09-07 ENCOUNTER — Ambulatory Visit (INDEPENDENT_AMBULATORY_CARE_PROVIDER_SITE_OTHER): Payer: Medicare Other

## 2021-09-07 ENCOUNTER — Telehealth: Payer: Self-pay

## 2021-09-07 VITALS — Ht 60.0 in

## 2021-09-07 DIAGNOSIS — Z0000 Encounter for general adult medical examination without abnormal findings: Secondary | ICD-10-CM

## 2021-09-07 NOTE — Patient Instructions (Signed)

## 2021-09-07 NOTE — Progress Notes (Signed)
?Virtual Visit via Telephone Note ? ?I connected with Ellen Kaiser on 09/07/21 at  1:20 PM EDT by telephone and verified that I am speaking with the correct person using two identifiers. ? ?Location: ?Patient: Home  ?[facility]  ?  ?I discussed the limitations, risks, security and privacy concerns of performing an evaluation and management service by telephone and the availability of in person appointments. I also discussed with the patient that there may be a patient responsible charge related to this service. The patient expressed understanding and agreed to proceed. ? ? ?  ?I discussed the assessment and treatment plan with the patient. The patient was provided an opportunity to ask questions and all were answered. The patient agreed with the plan and demonstrated an understanding of the instructions. ?  ?The patient was advised to call back or seek an in-person evaluation if the symptoms worsen or if the condition fails to improve as anticipated. ? ?I provided 30 minutes of non-face-to-face time during this encounter. ? ? ?Lonna CobbKelita L Odis Turck, CMA  ?Subjective:  ? ? Ellen Kaiser is a 31 y.o. female who presents for Medicare Annual/Subsequent preventive examination. ? ?Preventive Screening-Counseling & Management ? ?Tobacco ?Social History  ? ?Tobacco Use  ?Smoking Status Every Day  ? Packs/day: 0.50  ? Years: 10.00  ? Pack years: 5.00  ? Types: Cigarettes  ?Smokeless Tobacco Never  ?  ? ? ? ?Current Problems (verified) ?Patient Active Problem List  ? Diagnosis Date Noted  ? Class 1 obesity due to excess calories without serious comorbidity with body mass index (BMI) of 34.0 to 34.9 in adult 04/13/2021  ? Hidradenitis suppurativa 12/28/2019  ? GAD (generalized anxiety disorder) 10/08/2019  ? GERD (gastroesophageal reflux disease) 10/08/2019  ? 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 Bipolar 1 disorder (HCC) 04/02/2019  ? Migraine without aura and without status migrainosus, not  intractable 04/02/2019  ? Mild intermittent asthma without complication 03/21/2015  ? ? ?Medications Prior to Visit ?Current Outpatient Medications on File Prior to Visit  ?Medication Sig Dispense Refill  ? ABILIFY MAINTENA 400 MG PRSY prefilled syringe Inject into the muscle.    ? albuterol (VENTOLIN HFA) 108 (90 Base) MCG/ACT inhaler INHALE 1 PUFF INTO THE LUNGS EVERY 6 HOURS AS NEEDED FOR WHEEZING OR SHORTNESS OF BREATH. 8.5 each 1  ? busPIRone (BUSPAR) 10 MG tablet TAKE 1 TABLET BY MOUTH 2 TO 3 TIMES DIALY AS NEEDED FOR ANXIETY    ? clonazePAM (KLONOPIN) 1 MG tablet Take 1 tablet by mouth 2 (two) times daily.    ? doxycycline (VIBRA-TABS) 100 MG tablet Take 1 tablet (100 mg total) by mouth 2 (two) times daily. 14 tablet 0  ? hydrOXYzine (ATARAX/VISTARIL) 50 MG tablet Take 100 mg by mouth daily.     ? norethindrone (MICRONOR) 0.35 MG tablet Take 1 tablet (0.35 mg total) by mouth daily. 84 tablet 3  ? naproxen (NAPROSYN) 500 MG tablet TAKE 1 TABLET BY MOUTH 2 TIMES DAILY WITH A MEAL. (Patient not taking: Reported on 09/07/2021) 30 tablet 0  ? ?No current facility-administered medications on file prior to visit.  ? ? ?Current Medications (verified) ?Current Outpatient Medications  ?Medication Sig Dispense Refill  ? ABILIFY MAINTENA 400 MG PRSY prefilled syringe Inject into the muscle.    ? albuterol (VENTOLIN HFA) 108 (90 Base) MCG/ACT inhaler INHALE 1 PUFF INTO THE LUNGS EVERY 6 HOURS AS NEEDED FOR WHEEZING OR SHORTNESS OF BREATH. 8.5 each 1  ? busPIRone (BUSPAR) 10  MG tablet TAKE 1 TABLET BY MOUTH 2 TO 3 TIMES DIALY AS NEEDED FOR ANXIETY    ? clonazePAM (KLONOPIN) 1 MG tablet Take 1 tablet by mouth 2 (two) times daily.    ? doxycycline (VIBRA-TABS) 100 MG tablet Take 1 tablet (100 mg total) by mouth 2 (two) times daily. 14 tablet 0  ? hydrOXYzine (ATARAX/VISTARIL) 50 MG tablet Take 100 mg by mouth daily.     ? norethindrone (MICRONOR) 0.35 MG tablet Take 1 tablet (0.35 mg total) by mouth daily. 84 tablet 3  ?  naproxen (NAPROSYN) 500 MG tablet TAKE 1 TABLET BY MOUTH 2 TIMES DAILY WITH A MEAL. (Patient not taking: Reported on 09/07/2021) 30 tablet 0  ? ?No current facility-administered medications for this visit.  ?  ? ?Allergies (verified) ?Fish allergy  ? ?PAST HISTORY ? ?Family History ?Family History  ?Problem Relation Age of Onset  ? Depression Mother   ? Alcohol abuse Mother   ? Bipolar disorder Mother   ? ? ?Social History ?Social History  ? ?Tobacco Use  ? Smoking status: Every Day  ?  Packs/day: 0.50  ?  Years: 10.00  ?  Pack years: 5.00  ?  Types: Cigarettes  ? Smokeless tobacco: Never  ?Substance Use Topics  ? Alcohol use: Yes  ?  Alcohol/week: 3.0 standard drinks  ?  Types: 3 Cans of beer per week  ?  Comment: 3 beers weekly  ?  ? ?Are there smokers in your home (other than you)? Yes ? ?Risk Factors ?Current exercise habits: Gym/ health club routine includes treadmill.  ?Dietary issues discussed: Yes  ? ?Cardiac risk factors: smoking/ tobacco exposure. ? ?Depression Screen ?(Note: if answer to either of the following is "Yes", a more complete depression screening is indicated)  ? Over the past two weeks, have you felt down, depressed or hopeless? No ? Over the past two weeks, have you felt little interest or pleasure in doing things? No ? Have you lost interest or pleasure in daily life? No ? Do you often feel hopeless? No ? Do you cry easily over simple problems? No ? ?Activities of Daily Living ?In your present state of health, do you have any difficulty performing the following activities?:  ?Driving? No ?Managing money?  No ?Feeding yourself? No ?Getting from bed to chair? NoNo exam performed today,  telephone wellness visit  . ?Climbing a flight of stairs? No ?Preparing food and eating?: No ?Bathing or showering? No ?Getting dressed: No ?Getting to the toilet? No ?Using the toilet:No ?Moving around from place to place: No ?In the past year have you fallen or had a near fall?:No ? ? Are you sexually active?   Yes ? Do you have more than one partner?  No ? ?Hearing Difficulties: No ?Do you often ask people to speak up or repeat themselves? No ?Do you experience ringing or noises in your ears? No ?Do you have difficulty understanding soft or whispered voices? No ? ? Do you feel that you have a problem with memory? No ? Do you often misplace items? No ? Do you feel safe at home?  Yes ? ?Cognitive Testing ? Alert? Yes   ? Oriented to person? Yes  Place? Yes  ? Time? Yes ? Recall of three objects?  Yes ? Can perform simple calculations? Yes ? Displays appropriate judgment?Yes ?  ? ? Advanced Directives have been discussed with the patient? No ? ?List the Names of Other Physician/Practitioners you currently use: ?Primary Care:  Nicki Reaper, NP ?[facility]: Althea Grimmer, PA ?Behavioral Health: Christina Hussami, LCSW ?Periodontic: Larita Fife [facility]  ? ?-Indicate any recent Medical Services you may have received from other than [facility] providers in the past year OB-GYN Appt on 07/07/2021 with Helmut Muster Copland, PA.  ? ?-No Hospitalization in the past 12 mths. ? ?Immunization History  ?Administered Date(s) Administered  ? HPV 9-valent 08/15/2015, 12/16/2015  ? HPV Quadrivalent 04/19/2014  ? Influenza,inj,Quad PF,6+ Mos 04/13/2021  ? Influenza-Unspecified 02/21/2019, 06/05/2020  ? PFIZER(Purple Top)SARS-COV-2 Vaccination 12/04/2019, 12/27/2019, 07/09/2020  ? PPD Test 07/30/2015, 08/18/2016, 11/15/2017  ? Pneumococcal Polysaccharide-23 03/21/2015, 04/13/2021  ? Tdap 08/15/2015, 07/09/2020  ? ? ?Screening Tests ?Health Maintenance  ?Topic Date Due  ? COVID-19 Vaccine (4 - Booster for Pfizer series) 09/03/2020  ? TETANUS/TDAP  07/09/2030  ? INFLUENZA VACCINE  Completed  ? HPV VACCINES  Completed  ? Hepatitis C Screening  Completed  ? HIV Screening  Completed  ? PAP SMEAR-Modifier  Discontinued  ? ?Health Maintenance: ? ?HPV vaccine: 12/16/15, 08/15/15, 04/19/14 ?Influenza vaccine: 04/13/21 ?Pfizer: 07/09/20, 12/27/19,  12/04/19 ?Pneumococcal 23: 04/13/21 ?TDAP: 07/09/2020 ? ?Pap Smear: 07/07/2021  ?HIV screening: 08/05/21 ?Hepatitis C screening: 08/05/21 ? ?All answers were reviewed with the patient and necessary referrals were made:

## 2021-09-07 NOTE — Telephone Encounter (Signed)
I attempted to contact the patient, no answer. Left message for patient to call back and schedule the Medicare Annual Wellness Visit (AWV) virtually, telephone or face to face. ? ? ?Laurel Dimmer, CMA ?([phone] ?

## 2021-09-21 ENCOUNTER — Ambulatory Visit
Admission: RE | Admit: 2021-09-21 | Discharge: 2021-09-21 | Disposition: A | Discharge status: Home / Self Care | Payer: Medicare Other | Source: Ambulatory Visit | Attending: Emergency Medicine | Admitting: Emergency Medicine

## 2021-09-21 VITALS — BP 118/76 | HR 77 | Temp 98.2°F | Resp 18

## 2021-09-21 DIAGNOSIS — Z202 Contact with and (suspected) exposure to infections with a predominantly sexual mode of transmission: Secondary | ICD-10-CM | POA: Insufficient documentation

## 2021-09-21 DIAGNOSIS — Z113 Encounter for screening for infections with a predominantly sexual mode of transmission: Secondary | ICD-10-CM | POA: Insufficient documentation

## 2021-09-21 LAB — POCT URINALYSIS DIP (MANUAL ENTRY)
Bilirubin, UA: NEGATIVE
Blood, UA: NEGATIVE
Glucose, UA: NEGATIVE mg/dL
Ketones, POC UA: NEGATIVE mg/dL
Leukocytes, UA: NEGATIVE
Nitrite, UA: NEGATIVE
Protein Ur, POC: NEGATIVE mg/dL
Spec Grav, UA: 1.015 (ref 1.010–1.025)
Urobilinogen, UA: 0.2 E.U./dL
pH, UA: 6 (ref 5.0–8.0)

## 2021-09-21 LAB — POCT URINE PREGNANCY: Preg Test, Ur: NEGATIVE

## 2021-09-21 NOTE — Discharge Instructions (Addendum)
Follow up with your primary care provider if your symptoms are not improving.     

## 2021-09-21 NOTE — ED Triage Notes (Signed)
Pt presents with STD testing. Pt denies any symptoms or exposure. ?

## 2021-09-21 NOTE — ED Provider Notes (Signed)
?[facility] ? ? ? ?CSN: [id number] ?Arrival date & time: 09/21/21  1040 ? ? ?  ? ?History   ?Chief Complaint ?Chief Complaint  ?Patient presents with  ? SEXUALLY TRANSMITTED DISEASE  ?  Testing - Entered by patient  ? ? ?HPI ?Ellen Kaiser is a 31 y.o. female.  Patient presents with request for STD testing.  She denies current symptoms but requests routine testing as she has a potential new sexual partner and will be going out of town this weekend.  She denies vaginal discharge, pelvic pain, abdominal pain, dysuria, rash, or other symptoms.  Her medical history includes asthma, allergies, HSV, bacterial vaginitis, bipolar 1 disorder, anxiety, depression. ? ?The history is provided by the patient and medical records.  ? ?Past Medical History:  ?Diagnosis Date  ? Allergy   ? Anxiety   ? Asthma   ? Bipolar 1 disorder (HCC)   ? BV (bacterial vaginosis)   ? Depression   ? HSV-2 seropositive   ? ? ?Patient Active Problem List  ? Diagnosis Date Noted  ? Class 1 obesity due to excess calories without serious comorbidity with body mass index (BMI) of 34.0 to 34.9 in adult 04/13/2021  ? Hidradenitis suppurativa 12/28/2019  ? GAD (generalized anxiety disorder) 10/08/2019  ? GERD (gastroesophageal reflux disease) 10/08/2019  ? 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 Bipolar 1 disorder (HCC) 04/02/2019  ? Migraine without aura and without status migrainosus, not intractable 04/02/2019  ? Mild intermittent asthma without complication 03/21/2015  ? ? ?Past Surgical History:  ?Procedure Laterality Date  ? WISDOM TOOTH EXTRACTION    ? ? ?OB History   ? ? Gravida  ?0  ? Para  ?0  ? Term  ?0  ? Preterm  ?0  ? AB  ?0  ? Living  ?0  ?  ? ? SAB  ?0  ? IAB  ?0  ? Ectopic  ?0  ? Multiple  ?0  ? Live Births  ?0  ?   ?  ?  ? ? ? ?Home Medications   ? ?Prior to Admission medications   ?Medication Sig Start Date End Date Taking? Authorizing Provider  ?ABILIFY MAINTENA 400 MG PRSY prefilled syringe Inject into the muscle. 08/04/21    [provider]  ?albuterol (VENTOLIN HFA) 108 (90 Base) MCG/ACT inhaler INHALE 1 PUFF INTO THE LUNGS EVERY 6 HOURS AS NEEDED FOR WHEEZING OR SHORTNESS OF BREATH. 08/24/21   Lorre MunroeBaity, Regina W, NP  ?busPIRone (BUSPAR) 10 MG tablet TAKE 1 TABLET BY MOUTH 2 TO 3 TIMES DIALY AS NEEDED FOR ANXIETY 08/27/20   [provider]  ?clonazePAM (KLONOPIN) 1 MG tablet Take 1 tablet by mouth 2 (two) times daily. 02/28/19   [provider]  ?doxycycline (VIBRA-TABS) 100 MG tablet Take 1 tablet (100 mg total) by mouth 2 (two) times daily. 09/02/21   Lorre MunroeBaity, Regina W, NP  ?hydrOXYzine (ATARAX/VISTARIL) 50 MG tablet Take 100 mg by mouth daily.  03/30/19   [provider]  ?naproxen (NAPROSYN) 500 MG tablet TAKE 1 TABLET BY MOUTH 2 TIMES DAILY WITH A MEAL. ?Patient not taking: Reported on 09/07/2021 05/12/21   Lorre MunroeBaity, Regina W, NP  ?norethindrone (MICRONOR) 0.35 MG tablet Take 1 tablet (0.35 mg total) by mouth daily. 07/07/21   Copland, Ilona SorrelAlicia B, PA-C  ? ? ?Family History ?Family History  ?Problem Relation Age of Onset  ? Depression Mother   ? Alcohol abuse Mother   ? Bipolar disorder Mother   ? ? ?  Social History ?Social History  ? ?Tobacco Use  ? Smoking status: Every Day  ?  Packs/day: 0.50  ?  Years: 10.00  ?  Pack years: 5.00  ?  Types: Cigarettes  ? Smokeless tobacco: Never  ?Vaping Use  ? Vaping Use: Former  ? Substances: THC  ?Substance Use Topics  ? Alcohol use: Yes  ?  Alcohol/week: 3.0 standard drinks  ?  Types: 3 Cans of beer per week  ?  Comment: 3 beers weekly  ? Drug use: Yes  ?  Types: Marijuana  ? ? ? ?Allergies   ?Fish allergy ? ? ?Review of Systems ?Review of Systems  ?Constitutional:  Negative for chills and fever.  ?Gastrointestinal:  Negative for abdominal pain and vomiting.  ?Genitourinary:  Negative for dysuria, flank pain, hematuria, pelvic pain and vaginal discharge.  ?Skin:  Negative for color change and rash.  ?All other systems reviewed and are negative. ? ? ?Physical Exam ?Triage Vital  Signs ?ED Triage Vitals [09/21/21 1107]  ?Enc Vitals Group  ?   BP 118/76  ?   Pulse Rate 77  ?   Resp 18  ?   Temp 98.2 ?F (36.8 ?C)  ?   Temp src   ?   SpO2 99 %  ?   Weight   ?   Height   ?   Head Circumference   ?   Peak Flow   ?   Pain Score   ?   Pain Loc   ?   Pain Edu?   ?   Excl. in GC?   ? ?No data found. ? ?Updated Vital Signs ?BP 118/76   Pulse 77   Temp 98.2 ?F (36.8 ?C)   Resp 18   LMP 09/08/2021   SpO2 99%  ? ?Visual Acuity ?Right Eye Distance:   ?Left Eye Distance:   ?Bilateral Distance:   ? ?Right Eye Near:   ?Left Eye Near:    ?Bilateral Near:    ? ?Physical Exam ?Vitals and nursing note reviewed.  ?Constitutional:   ?   General: She is not in acute distress. ?   Appearance: She is well-developed. She is not ill-appearing.  ?HENT:  ?   Mouth/Throat:  ?   Mouth: Mucous membranes are moist.  ?Cardiovascular:  ?   Rate and Rhythm: Normal rate and regular rhythm.  ?   Heart sounds: Normal heart sounds.  ?Pulmonary:  ?   Effort: Pulmonary effort is normal. No respiratory distress.  ?   Breath sounds: Normal breath sounds.  ?Abdominal:  ?   General: Bowel sounds are normal.  ?   Palpations: Abdomen is soft.  ?   Tenderness: There is no abdominal tenderness. There is no right CVA tenderness, left CVA tenderness, guarding or rebound.  ?Musculoskeletal:  ?   Cervical back: Neck supple.  ?Skin: ?   General: Skin is warm and dry.  ?Neurological:  ?   Mental Status: She is alert.  ?Psychiatric:     ?   Mood and Affect: Mood normal.     ?   Behavior: Behavior normal.  ? ? ? ?UC Treatments / Results  ?Labs ?(all labs ordered are listed, but only abnormal results are displayed) ?Labs Reviewed  ?RPR  ?HIV ANTIBODY (ROUTINE TESTING W REFLEX)  ?POCT URINALYSIS DIP (MANUAL ENTRY)  ?POCT URINE PREGNANCY  ?CERVICOVAGINAL ANCILLARY ONLY  ? ? ?EKG ? ? ?Radiology ?No results found. ? ?Procedures ?Procedures (including critical care time) ? ?Medications  Ordered in UC ?Medications - No data to display ? ?Initial  Impression / Assessment and Plan / UC Course  ?I have reviewed the triage vital signs and the nursing notes. ? ?Pertinent labs & imaging results that were available during my care of the patient were reviewed by me and considered in my medical decision making (see chart for details). ? ?  ?Screening for STDs.  Patient obtained vaginal self swab for testing.  Blood obtained for HIV and syphilis testing.  Discussed that we will call if test results are positive.  Discussed that she may require treatment at that time.  Instructed patient to abstain from sexual activity until test results back.  Instructed her to follow-up with her PCP or gynecologist as needed.  Patient agrees to plan of care.  ? ?Final Clinical Impressions(s) / UC Diagnoses  ? ?Final diagnoses:  ?Screening for STD (sexually transmitted disease)  ? ? ? ?Discharge Instructions   ? ?  ?Follow up with your primary care provider if your symptoms are not improving.   ? ? ? ? ? ?ED Prescriptions   ?None ?  ? ?PDMP not reviewed this encounter. ?  ?Mickie Bail, NP ?09/21/21 1131 ? ?

## 2021-09-22 LAB — CERVICOVAGINAL ANCILLARY ONLY
Bacterial Vaginitis (gardnerella): NEGATIVE
Candida Glabrata: NEGATIVE
Candida Vaginitis: NEGATIVE
Chlamydia: NEGATIVE
Comment: NEGATIVE
Comment: NEGATIVE
Comment: NEGATIVE
Comment: NEGATIVE
Comment: NEGATIVE
Comment: NORMAL
Neisseria Gonorrhea: NEGATIVE
Trichomonas: NEGATIVE

## 2021-09-22 LAB — RPR: RPR Ser Ql: NONREACTIVE

## 2021-09-22 LAB — HIV ANTIBODY (ROUTINE TESTING W REFLEX): HIV Screen 4th Generation wRfx: NONREACTIVE

## 2021-09-24 ENCOUNTER — Ambulatory Visit: Payer: Medicare Other | Admitting: Internal Medicine

## 2021-10-02 ENCOUNTER — Other Ambulatory Visit: Payer: Self-pay | Admitting: Internal Medicine

## 2021-10-02 NOTE — Telephone Encounter (Signed)
Medication discontinued 07/07/21. ?Requested Prescriptions  ?Refused Prescriptions Disp Refills  ?? buPROPion (WELLBUTRIN SR) 150 MG 12 hr tablet [Pharmacy Med Name: BUPROPION HCL SR 150 MG TABLET] 180 tablet 0  ?  Sig: TAKE 1 TAB BY MOUTH DAILY IN AM X 3 DAYS THEN INCREASE TO 1 TAB BY MOUTH TWICE A DAY THEREAFTER  ?  ? Psychiatry: Antidepressants - bupropion Failed - 10/02/2021  2:25 AM  ?  ?  Failed - Cr in normal range and within 360 days  ?  Creat  ?Date Value Ref Range Status  ?05/26/2020 0.95 0.50 - 1.10 mg/dL Final  ?   ?  ?  Failed - AST in normal range and within 360 days  ?  AST  ?Date Value Ref Range Status  ?05/26/2020 16 10 - 30 U/L Final  ?   ?  ?  Failed - ALT in normal range and within 360 days  ?  ALT  ?Date Value Ref Range Status  ?05/26/2020 15 6 - 29 U/L Final  ?   ?  ?  Passed - Last BP in normal range  ?  BP Readings from Last 1 Encounters:  ?09/21/21 118/76  ?   ?  ?  Passed - Valid encounter within last 6 months  ?  Recent Outpatient Visits   ?      ? 1 month ago Hidradenitis  ? [facility] [place], Salvadore Oxford, NP  ? 1 month ago Vaginal discharge  ? [facility] [place], Salvadore Oxford, NP  ? 5 months ago Vaginal discharge  ? [facility] [place], [place] W, NP  ? 5 months ago Need for immunization against influenza  ? [facility] [place], [place] W, NP  ? 9 months ago Abscess of multiple sites of buttock  ? [facility], Salvadore Oxford, NP  ?  ?  ?Future Appointments   ?        ? In 1 week Baity, Salvadore Oxford, NP [facility]  ?  ? ?  ?  ?  ? ?

## 2021-10-14 ENCOUNTER — Ambulatory Visit (INDEPENDENT_AMBULATORY_CARE_PROVIDER_SITE_OTHER): Payer: Medicare Other | Admitting: Internal Medicine

## 2021-10-14 ENCOUNTER — Encounter: Payer: Self-pay | Admitting: Internal Medicine

## 2021-10-14 VITALS — BP 116/78 | HR 91 | Temp 97.5°F | Ht 60.0 in | Wt 179.0 lb

## 2021-10-14 DIAGNOSIS — L089 Local infection of the skin and subcutaneous tissue, unspecified: Secondary | ICD-10-CM

## 2021-10-14 DIAGNOSIS — Z0001 Encounter for general adult medical examination with abnormal findings: Secondary | ICD-10-CM

## 2021-10-14 DIAGNOSIS — E6609 Other obesity due to excess calories: Secondary | ICD-10-CM

## 2021-10-14 DIAGNOSIS — Z6834 Body mass index (BMI) 34.0-34.9, adult: Secondary | ICD-10-CM | POA: Diagnosis not present

## 2021-10-14 MED ORDER — CEPHALEXIN 500 MG PO CAPS: 500.0000 mg | ORAL_CAPSULE | Freq: Three times a day (TID) | ORAL | 0 refills | Status: DC

## 2021-10-14 NOTE — Patient Instructions (Signed)

## 2021-10-14 NOTE — Progress Notes (Signed)
? ?Subjective:  ? ? Patient ID: Ellen Kaiser, female    DOB: 02-Apr-1991, 31 y.o.   MRN: [id number] ? ?HPI ? ?Pt presents to the clinic today for her annual exam.  ? ?She has some concerns about pain and swelling around the dermal at her left temple.  She noticed this 2 days ago.  She has not noticed any drainage from the area. ? ?Flu: 03/2021 ?Tetanus: 06/2020 ?Covid: Pfizer x 3 ?Pap smear: 06/2021 ?Dentist: biannually ? ?Diet: She Does eat meat. She consumes more veggies than fruits. She does eat some fried foods. She drinks mostly water, beer. ?Exercise: intermittent ? ?Review of Systems ? ?   ?Past Medical History:  ?Diagnosis Date  ? Allergy   ? Anxiety   ? Asthma   ? Bipolar 1 disorder ([place])   ? BV (bacterial vaginosis)   ? Depression   ? HSV-2 seropositive   ? ? ?Current Outpatient Medications  ?Medication Sig Dispense Refill  ? ABILIFY MAINTENA 400 MG PRSY prefilled syringe Inject into the muscle.    ? albuterol (VENTOLIN HFA) 108 (90 Base) MCG/ACT inhaler INHALE 1 PUFF INTO THE LUNGS EVERY 6 HOURS AS NEEDED FOR WHEEZING OR SHORTNESS OF BREATH. 8.5 each 1  ? busPIRone (BUSPAR) 10 MG tablet TAKE 1 TABLET BY MOUTH 2 TO 3 TIMES DIALY AS NEEDED FOR ANXIETY    ? clonazePAM (KLONOPIN) 1 MG tablet Take 1 tablet by mouth 2 (two) times daily.    ? doxycycline (VIBRA-TABS) 100 MG tablet Take 1 tablet (100 mg total) by mouth 2 (two) times daily. 14 tablet 0  ? hydrOXYzine (ATARAX/VISTARIL) 50 MG tablet Take 100 mg by mouth daily.     ? naproxen (NAPROSYN) 500 MG tablet TAKE 1 TABLET BY MOUTH 2 TIMES DAILY WITH A MEAL. (Patient not taking: Reported on 09/07/2021) 30 tablet 0  ? norethindrone (MICRONOR) 0.35 MG tablet Take 1 tablet (0.35 mg total) by mouth daily. 84 tablet 3  ? ?No current facility-administered medications for this visit.  ? ? ?Allergies  ?Allergen Reactions  ? Fish Allergy Anaphylaxis and Swelling  ? ? ?Family History  ?Problem Relation Age of Onset  ? Depression Mother   ? Alcohol abuse Mother   ?  Bipolar disorder Mother   ? ? ?Social History  ? ?Socioeconomic History  ? Marital status: Single  ?  Spouse name: Not on file  ? Number of children: Not on file  ? Years of education: college  ? Highest education level: Not on file  ?Occupational History  ? Occupation: disabled  ?  Comment: PTSD  ?Tobacco Use  ? Smoking status: Every Day  ?  Packs/day: 0.50  ?  Years: 10.00  ?  Pack years: 5.00  ?  Types: Cigarettes  ? Smokeless tobacco: Never  ?Vaping Use  ? Vaping Use: Former  ? Substances: THC  ?Substance and Sexual Activity  ? Alcohol use: Yes  ?  Alcohol/week: 3.0 standard drinks  ?  Types: 3 Cans of beer per week  ?  Comment: 3 beers weekly  ? Drug use: Yes  ?  Types: Marijuana  ? Sexual activity: Yes  ?  Partners: Male  ?  Birth control/protection: Pill  ?  Comment: Mirena  ?Other Topics Concern  ? Not on file  ?Social History Narrative  ? Not on file  ? ?Social Determinants of Health  ? ?Financial Resource Strain: Low Risk   ? Difficulty of Paying Living Expenses: Not hard at all  ?  Food Insecurity: Not on file  ?Transportation Needs: No Transportation Needs  ? Lack of Transportation (Medical): No  ? Lack of Transportation (Non-Medical): No  ?Physical Activity: Insufficiently Active  ? Days of Exercise per Week: 1 day  ? Minutes of Exercise per Session: 40 min  ?Stress: Stress Concern Present  ? Feeling of Stress : To some extent  ?Social Connections: Socially Integrated  ? Frequency of Communication with Friends and Family: More than three times a week  ? Frequency of Social Gatherings with Friends and Family: More than three times a week  ? Attends Religious Services: More than 4 times per year  ? Active Member of Clubs or Organizations: Yes  ? Attends Archivist Meetings: Never  ? Marital Status: Living with partner  ?Intimate Partner Violence: Not on file  ? ? ? ?Constitutional: Patient reports intermittent headaches.  Denies fever, malaise, fatigue, or abrupt weight changes.  ?HEENT: Denies  eye pain, eye redness, ear pain, ringing in the ears, wax buildup, runny nose, nasal congestion, bloody nose, or sore throat. ?Respiratory: Denies difficulty breathing, shortness of breath, cough or sputum production.   ?Cardiovascular: Denies chest pain, chest tightness, palpitations or swelling in the hands or feet.  ?Gastrointestinal: Denies abdominal pain, bloating, constipation, diarrhea or blood in the stool.  ?GU: Denies urgency, frequency, pain with urination, burning sensation, blood in urine, odor or discharge. ?Musculoskeletal: Denies decrease in range of motion, difficulty with gait, muscle pain or joint pain and swelling.  ?Skin: Patient reports pain and swelling around the dermal of her left temple, patient reports recurrent boils in her groin area.  Denies rashes or ulcercations.  ?Neurological: Denies dizziness, difficulty with memory, difficulty with speech or problems with balance and coordination.  ?Psych: Patient has a history of anxiety and depression.  Denies SI/HI. ? ?No other specific complaints in a complete review of systems (except as listed in HPI above). ? ?Objective:  ? Physical Exam ? ?BP 116/78 (BP Location: Left Arm, Patient Position: Sitting, Cuff Size: Large)   Pulse 91   Temp (!) 97.5 ?F (36.4 ?C) (Temporal)   Ht 5' (1.524 m)   Wt 179 lb (81.2 kg)   SpO2 100%   BMI 34.96 kg/m?  ? ?Wt Readings from Last 3 Encounters:  ?09/02/21 176 lb (79.8 kg)  ?08/05/21 178 lb (80.7 kg)  ?07/07/21 177 lb (80.3 kg)  ? ? ?General: Appears her stated age, obese, in NAD. ?Skin: Warm, dry and intact.  Mild redness, swelling and tenderness noted around the dermal of the left temple.  No discharge noted. ?HEENT: Head: normal shape and size; Eyes: sclera white, no icterus, conjunctiva pink, PERRLA and EOMs intact;  ?Neck:  Neck supple, trachea midline. No masses, lumps or thyromegaly present.  ?Cardiovascular: Normal rate and rhythm. S1,S2 noted.  No murmur, rubs or gallops noted. No JVD or BLE  edema.  ?Pulmonary/Chest: Normal effort and positive vesicular breath sounds. No respiratory distress. No wheezes, rales or ronchi noted.  ?Abdomen: Normal bowel sounds. ?Musculoskeletal: Strength 5/5 BUE/BLE.  No difficulty with gait.  ?Neurological: Alert and oriented. Cranial nerves II-XII grossly intact. Coordination normal.  ?Psychiatric: Mood and affect normal. Behavior is normal. Judgment and thought content normal.  ? ? ?BMET ?   ?Component Value Date/Time  ? NA 139 05/26/2020 0947  ? K 4.3 05/26/2020 0947  ? CL 107 05/26/2020 0947  ? CO2 22 05/26/2020 0947  ? GLUCOSE 81 05/26/2020 0947  ? BUN 11 05/26/2020 0947  ?  CREATININE 0.95 05/26/2020 0947  ? CALCIUM 9.8 05/26/2020 0947  ? GFRNONAA 81 05/26/2020 0947  ? GFRAA 94 05/26/2020 0947  ? ? ?Lipid Panel  ?No results found for: CHOL, TRIG, HDL, CHOLHDL, VLDL, LDLCALC ? ?CBC ?   ?Component Value Date/Time  ? WBC 4.9 05/26/2020 0947  ? RBC 4.28 05/26/2020 0947  ? HGB 14.6 05/26/2020 0947  ? HCT 43.2 05/26/2020 0947  ? PLT 258 05/26/2020 0947  ? MCV 100.9 (H) 05/26/2020 0947  ? MCH 34.1 (H) 05/26/2020 0947  ? MCHC 33.8 05/26/2020 0947  ? RDW 12.1 05/26/2020 0947  ? LYMPHSABS 2,141 05/26/2020 0947  ? EOSABS 20 05/26/2020 0947  ? BASOSABS 29 05/26/2020 0947  ? ? ?Hgb A1C ?No results found for: HGBA1C ? ? ? ? ? ? ?   ?Assessment & Plan:  ? ?Preventative Health Maintenance: ? ?Encouraged her to get a flu shot in the fall ?Tetanus UTD ?Encouraged her to get her COVID booster ?Pap smear UTD ?Encouraged her to consume a balanced diet and exercise regimen ?Advised her to see a dentist annually ?We will check CBC, c-Met, lipid, A1c today ? ?Infected Piercing of Face: ? ?Rx for Keflex 500 mg 3 times daily for 5 days ? ?RTC in 6 months, follow-up chronic conditions ?Webb Silversmith, NP ? ?

## 2021-10-14 NOTE — Assessment & Plan Note (Signed)
Encourage diet and exercise for weight loss 

## 2021-10-15 LAB — COMPLETE METABOLIC PANEL WITH GFR
AG Ratio: 1.7 (calc) (ref 1.0–2.5)
ALT: 16 U/L (ref 6–29)
AST: 17 U/L (ref 10–30)
Albumin: 4.3 g/dL (ref 3.6–5.1)
Alkaline phosphatase (APISO): 58 U/L (ref 31–125)
BUN: 9 mg/dL (ref 7–25)
CO2: 23 mmol/L (ref 20–32)
Calcium: 9.7 mg/dL (ref 8.6–10.2)
Chloride: 106 mmol/L (ref 98–110)
Creat: 0.93 mg/dL (ref 0.50–0.97)
Globulin: 2.5 g/dL (calc) (ref 1.9–3.7)
Glucose, Bld: 89 mg/dL (ref 65–99)
Potassium: 3.9 mmol/L (ref 3.5–5.3)
Sodium: 140 mmol/L (ref 135–146)
Total Bilirubin: 0.5 mg/dL (ref 0.2–1.2)
Total Protein: 6.8 g/dL (ref 6.1–8.1)
eGFR: 84 mL/min/{1.73_m2} (ref >=60)

## 2021-10-15 LAB — HEMOGLOBIN A1C
Hgb A1c MFr Bld: 5.1 % of total Hgb (ref <5.7)
Mean Plasma Glucose: 100 mg/dL
eAG (mmol/L): 5.5 mmol/L

## 2021-10-15 LAB — CBC
HCT: 42.4 % (ref 35.0–45.0)
Hemoglobin: 13.9 g/dL (ref 11.7–15.5)
MCH: 33.2 pg — ABNORMAL HIGH (ref 27.0–33.0)
MCHC: 32.8 g/dL (ref 32.0–36.0)
MCV: 101.2 fL — ABNORMAL HIGH (ref 80.0–100.0)
MPV: 11 fL (ref 7.5–12.5)
Platelets: 249 10*3/uL (ref 140–400)
RBC: 4.19 10*6/uL (ref 3.80–5.10)
RDW: 12.5 % (ref 11.0–15.0)
WBC: 6.3 10*3/uL (ref 3.8–10.8)

## 2021-10-15 LAB — LIPID PANEL
Cholesterol: 179 mg/dL (ref <200)
HDL: 85 mg/dL (ref >=50)
LDL Cholesterol (Calc): 76 mg/dL (calc)
Non-HDL Cholesterol (Calc): 94 mg/dL (calc) (ref <130)
Total CHOL/HDL Ratio: 2.1 (calc) (ref <5.0)
Triglycerides: 93 mg/dL (ref <150)

## 2021-11-30 ENCOUNTER — Other Ambulatory Visit: Payer: Self-pay | Admitting: Internal Medicine

## 2021-12-01 NOTE — Telephone Encounter (Signed)
Requested Prescriptions  Pending Prescriptions Disp Refills  . albuterol (VENTOLIN HFA) 108 (90 Base) MCG/ACT inhaler [Pharmacy Med Name: ALBUTEROL HFA (PROAIR) INHALER] 8.5 each 5    Sig: INHALE 1 PUFF INTO THE LUNGS EVERY 6 HOURS AS NEEDED FOR WHEEZING OR SHORTNESS OF BREATH.     Pulmonology:  Beta Agonists 2 Passed - 11/30/2021  1:54 AM      Passed - Last BP in normal range    BP Readings from Last 1 Encounters:  10/14/21 116/78         Passed - Last Heart Rate in normal range    Pulse Readings from Last 1 Encounters:  10/14/21 91         Passed - Valid encounter within last 12 months    Recent Outpatient Visits          1 month ago Encounter for general adult medical examination with abnormal findings   [facility] [place], Coralie Keens, NP   3 months ago Hidradenitis   [facility] [place], Coralie Keens, NP   3 months ago Vaginal discharge   [place], Coralie Keens, NP   7 months ago Vaginal discharge   [place], NP   7 months ago Need for immunization against influenza   [place], NP      Future Appointments            In 4 months Baity, Coralie Keens, NP [facility]

## 2022-01-04 ENCOUNTER — Encounter: Payer: Self-pay | Admitting: Internal Medicine

## 2022-01-04 ENCOUNTER — Ambulatory Visit (INDEPENDENT_AMBULATORY_CARE_PROVIDER_SITE_OTHER): Payer: Medicare Other | Admitting: Internal Medicine

## 2022-01-04 ENCOUNTER — Other Ambulatory Visit (HOSPITAL_COMMUNITY)
Admission: RE | Admit: 2022-01-04 | Discharge: 2022-01-04 | Disposition: A | Discharge status: Home / Self Care | Payer: Medicare Other | Source: Ambulatory Visit | Attending: Internal Medicine | Admitting: Internal Medicine

## 2022-01-04 VITALS — BP 106/60 | HR 66 | Temp 97.3°F | Wt 177.0 lb

## 2022-01-04 DIAGNOSIS — Z113 Encounter for screening for infections with a predominantly sexual mode of transmission: Secondary | ICD-10-CM

## 2022-01-04 NOTE — Progress Notes (Signed)
Subjective:    Patient ID: Ellen Kaiser, female    DOB: 07-18-1990, 31 y.o.   MRN: [id number]  HPI  Pt presents to the clinic today requesting STD testing. She denies recent exposure or any symptoms at this time. She does have a history of genital herpes.  Review of Systems  Past Medical History:  Diagnosis Date   Allergy    Anxiety    Asthma    Bipolar 1 disorder (HCC)    BV (bacterial vaginosis)    Depression    HSV-2 seropositive     Current Outpatient Medications  Medication Sig Dispense Refill   ABILIFY MAINTENA 400 MG PRSY prefilled syringe Inject into the muscle.     albuterol (VENTOLIN HFA) 108 (90 Base) MCG/ACT inhaler INHALE 1 PUFF INTO THE LUNGS EVERY 6 HOURS AS NEEDED FOR WHEEZING OR SHORTNESS OF BREATH. 8.5 each 5   busPIRone (BUSPAR) 10 MG tablet TAKE 1 TABLET BY MOUTH 2 TO 3 TIMES DIALY AS NEEDED FOR ANXIETY     cephALEXin (KEFLEX) 500 MG capsule Take 1 capsule (500 mg total) by mouth 3 (three) times daily. 15 capsule 0   clonazePAM (KLONOPIN) 1 MG tablet Take 1 tablet by mouth 2 (two) times daily.     hydrOXYzine (ATARAX/VISTARIL) 50 MG tablet Take 100 mg by mouth daily.      No current facility-administered medications for this visit.    Allergies  Allergen Reactions   Fish Allergy Anaphylaxis and Swelling    Family History  Problem Relation Age of Onset   Depression Mother    Alcohol abuse Mother    Bipolar disorder Mother    Healthy Father    Healthy Sister    Osteoarthritis Maternal Grandmother    Dementia Paternal Grandmother     Social History   Socioeconomic History   Marital status: Single    Spouse name: Not on file   Number of children: Not on file   Years of education: college   Highest education level: Not on file  Occupational History   Occupation: disabled    Comment: PTSD  Tobacco Use   Smoking status: Every Day    Packs/day: 0.50    Years: 10.00    Total pack years: 5.00    Types: Cigarettes   Smokeless  tobacco: Never  Vaping Use   Vaping Use: Former   Substances: THC  Substance and Sexual Activity   Alcohol use: Yes    Alcohol/week: 3.0 standard drinks of alcohol    Types: 3 Cans of beer per week    Comment: 3 beers weekly   Drug use: Yes    Types: Marijuana   Sexual activity: Yes    Partners: Male    Birth control/protection: Pill    Comment: Mirena  Other Topics Concern   Not on file  Social History Narrative   Not on file   Social Determinants of Health   Financial Resource Strain: Low Risk  (09/07/2021)   Overall Financial Resource Strain (CARDIA)    Difficulty of Paying Living Expenses: Not hard at all  Food Insecurity: Not on file  Transportation Needs: No Transportation Needs (09/07/2021)   PRAPARE - Transportation    Lack of Transportation (Medical): No    Lack of Transportation (Non-Medical): No  Physical Activity: Insufficiently Active (09/07/2021)   Exercise Vital Sign    Days of Exercise per Week: 1 day    Minutes of Exercise per Session: 40 min  Stress: Stress Concern Present (09/07/2021)  [employer] of Occupational Health - Occupational Stress Questionnaire    Feeling of Stress : To some extent  Social Connections: Socially Integrated (09/07/2021)   Social Connection and Isolation Panel [NHANES]    Frequency of Communication with Friends and Family: More than three times a week    Frequency of Social Gatherings with Friends and Family: More than three times a week    Attends Religious Services: More than 4 times per year    Active Member of [employer] or Organizations: Yes    Attends Banker Meetings: Never    Marital Status: Living with partner  Intimate Partner Violence: Not on file     Constitutional: Denies fever, malaise, fatigue, headache or abrupt weight changes.  Respiratory: Denies difficulty breathing, shortness of breath, cough or sputum production.   Cardiovascular: Denies chest pain, chest tightness, palpitations or swelling  in the hands or feet.  Gastrointestinal: Denies abdominal pain, bloating, constipation, diarrhea or blood in the stool.  GU: Denies urgency, frequency, pain with urination, burning sensation, blood in urine, odor or discharge.  No other specific complaints in a complete review of systems (except as listed in HPI above).     Objective:   Physical Exam  BP 106/60 (BP Location: Left Arm, Patient Position: Sitting, Cuff Size: Normal)   Pulse 66   Temp (!) 97.3 F (36.3 C) (Temporal)   Wt 177 lb (80.3 kg)   SpO2 99%   BMI 34.57 kg/m   Wt Readings from Last 3 Encounters:  10/14/21 179 lb (81.2 kg)  09/02/21 176 lb (79.8 kg)  08/05/21 178 lb (80.7 kg)    General: Appears her stated age,obese, in NAD. Cardiovascular: Normal rate and rhythm.  Pulmonary/Chest: Normal effort and positive vesicular breath sounds.  Neurological: Alert and oriented.  BMET    Component Value Date/Time   NA 140 10/14/2021 0901   K 3.9 10/14/2021 0901   CL 106 10/14/2021 0901   CO2 23 10/14/2021 0901   GLUCOSE 89 10/14/2021 0901   BUN 9 10/14/2021 0901   CREATININE 0.93 10/14/2021 0901   CALCIUM 9.7 10/14/2021 0901   GFRNONAA 81 05/26/2020 0947   GFRAA 94 05/26/2020 0947    Lipid Panel     Component Value Date/Time   CHOL 179 10/14/2021 0901   TRIG 93 10/14/2021 0901   HDL 85 10/14/2021 0901   CHOLHDL 2.1 10/14/2021 0901   LDLCALC 76 10/14/2021 0901    CBC    Component Value Date/Time   WBC 6.3 10/14/2021 0901   RBC 4.19 10/14/2021 0901   HGB 13.9 10/14/2021 0901   HCT 42.4 10/14/2021 0901   PLT 249 10/14/2021 0901   MCV 101.2 (H) 10/14/2021 0901   MCH 33.2 (H) 10/14/2021 0901   MCHC 32.8 10/14/2021 0901   RDW 12.5 10/14/2021 0901   LYMPHSABS 2,141 05/26/2020 0947   EOSABS 20 05/26/2020 0947   BASOSABS 29 05/26/2020 0947    Hgb A1C Lab Results  Component Value Date   HGBA1C 5.1 10/14/2021           Assessment & Plan:   Screen for STD:  Will check urine gonorrhea,  chlamydia and trich Will check HIV, RPR, Hep C  RTC in 3 months, follow up chronic conditions Nicki Reaper, NP

## 2022-01-04 NOTE — Patient Instructions (Signed)
Safe Sex Practicing safe sex means taking steps before and during sex to reduce your risk of: Getting an STI (sexually transmitted infection). Giving your partner an STI. Unwanted or unplanned pregnancy. How to practice safe sex Ways you can practice safe sex  Limit your sexual partners to only one partner who is having sex with only you. Avoid using alcohol and drugs before having sex. Alcohol and drugs can affect your judgment. Before having sex with a new partner: Talk to your partner about past partners, past STIs, and drug use. Get screened for STIs and discuss the results with your partner. Ask your partner to get screened too. Check your body regularly for sores, blisters, rashes, or unusual discharge. If you notice any of these problems, visit your health care provider. Avoid sexual contact if you have symptoms of an infection or you are being treated for an STI. While having sex, use a condom. Make sure to: Use a condom every time you have vaginal, oral, or anal sex. Both females and males should wear condoms during oral sex. Keep condoms in place from the beginning to the end of sexual activity. Use a latex condom, if possible. Latex condoms offer the best protection. Use only water-based lubricants with a condom. Using petroleum-based lubricants or oils will weaken the condom and increase the chance that it will break. Ways your health care provider can help you practice safe sex  See your health care provider for regular screenings, exams, and tests for STIs. Talk with your health care provider about what kind of birth control (contraception) is best for you. Get vaccinated against hepatitis B and human papillomavirus (HPV). If you are at risk of being infected with HIV (human immunodeficiency virus), talk with your health care provider about taking a prescription medicine to prevent HIV infection. You are at risk for HIV if you: Are a man who has sex with other men. Are  sexually active with more than one partner. Take drugs by injection. Have a sex partner who has HIV. Have unprotected sex. Have sex with someone who has sex with both men and women. Have had an STI. Follow these instructions at home: Take over-the-counter and prescription medicines only as told by your health care provider. Keep all follow-up visits. This is important. Where to find more information Centers for Disease Control and Prevention: www.cdc.gov Planned Parenthood: www.plannedparenthood.org Office on Women's Health: www.womenshealth.gov Summary Practicing safe sex means taking steps before and during sex to reduce your risk getting an STI, giving your partner an STI, and having an unwanted or unplanned pregnancy. Before having sex with a new partner, talk to your partner about past partners, past STIs, and drug use. Use a condom every time you have vaginal, oral, or anal sex. Both females and males should wear condoms during oral sex. Check your body regularly for sores, blisters, rashes, or unusual discharge. If you notice any of these problems, visit your health care provider. See your health care provider for regular screenings, exams, and tests for STIs. This information is not intended to replace advice given to you by your health care provider. Make sure you discuss any questions you have with your health care provider. Document Revised: 11/12/2019 Document Reviewed: 11/12/2019 Elsevier Patient Education  2023 Elsevier Inc.  

## 2022-01-05 LAB — RPR: RPR Ser Ql: NONREACTIVE

## 2022-01-05 LAB — HEPATITIS C ANTIBODY: Hepatitis C Ab: NONREACTIVE

## 2022-01-05 LAB — HIV ANTIBODY (ROUTINE TESTING W REFLEX): HIV 1&2 Ab, 4th Generation: NONREACTIVE

## 2022-01-06 LAB — URINE CYTOLOGY ANCILLARY ONLY
Chlamydia: NEGATIVE
Comment: NEGATIVE
Comment: NEGATIVE
Comment: NORMAL
Neisseria Gonorrhea: NEGATIVE
Trichomonas: NEGATIVE

## 2022-01-11 ENCOUNTER — Ambulatory Visit: Payer: Medicare Other | Admitting: Internal Medicine

## 2022-02-25 ENCOUNTER — Encounter: Payer: Self-pay | Admitting: Internal Medicine

## 2022-02-25 ENCOUNTER — Ambulatory Visit (INDEPENDENT_AMBULATORY_CARE_PROVIDER_SITE_OTHER): Payer: Medicare Other | Admitting: Internal Medicine

## 2022-02-25 VITALS — BP 118/76 | HR 57 | Temp 97.3°F | Wt 180.0 lb

## 2022-02-25 DIAGNOSIS — J029 Acute pharyngitis, unspecified: Secondary | ICD-10-CM

## 2022-02-25 DIAGNOSIS — Z23 Encounter for immunization: Secondary | ICD-10-CM | POA: Diagnosis not present

## 2022-02-25 DIAGNOSIS — J4520 Mild intermittent asthma, uncomplicated: Secondary | ICD-10-CM | POA: Diagnosis not present

## 2022-02-25 LAB — POCT RAPID STREP A (OFFICE): Rapid Strep A Screen: NEGATIVE

## 2022-02-25 MED ORDER — METHYLPREDNISOLONE ACETATE 80 MG/ML IJ SUSP
80.0000 mg | Freq: Once | INTRAMUSCULAR | Status: AC
  Administered 2022-02-25: 80 mg via INTRAMUSCULAR

## 2022-02-25 NOTE — Patient Instructions (Signed)

## 2022-02-25 NOTE — Progress Notes (Signed)
Subjective:    Patient ID: Ellen Kaiser, female    DOB: 1990-07-30, 31 y.o.   MRN: [id number]  HPI  Patient presents to clinic today with complaint of sore throat, cough and wheezing.  This started 4 days ago. She is having some difficulty swallowing.  The cough is nonproductive.  She denies headache, runny nose, nasal congestion, ear pain.  She denies fever, chills or body aches.  She has not had sick contacts diagnosed with strep.  She has tried Ibuprofen OTC with minimal relief of symptoms.  She does have a history of allergies and asthma.  She does smoke.  Review of Systems     Past Medical History:  Diagnosis Date   Allergy    Anxiety    Asthma    Bipolar 1 disorder (HCC)    BV (bacterial vaginosis)    Depression    HSV-2 seropositive     Current Outpatient Medications  Medication Sig Dispense Refill   ABILIFY MAINTENA 400 MG PRSY prefilled syringe Inject into the muscle.     albuterol (VENTOLIN HFA) 108 (90 Base) MCG/ACT inhaler INHALE 1 PUFF INTO THE LUNGS EVERY 6 HOURS AS NEEDED FOR WHEEZING OR SHORTNESS OF BREATH. 8.5 each 5   busPIRone (BUSPAR) 10 MG tablet TAKE 1 TABLET BY MOUTH 2 TO 3 TIMES DIALY AS NEEDED FOR ANXIETY     clonazePAM (KLONOPIN) 1 MG tablet Take 1 tablet by mouth 2 (two) times daily.     hydrOXYzine (ATARAX/VISTARIL) 50 MG tablet Take 100 mg by mouth daily.      No current facility-administered medications for this visit.    Allergies  Allergen Reactions   Fish Allergy Anaphylaxis and Swelling    Family History  Problem Relation Age of Onset   Depression Mother    Alcohol abuse Mother    Bipolar disorder Mother    Healthy Father    Healthy Sister    Osteoarthritis Maternal Grandmother    Dementia Paternal Grandmother     Social History   Socioeconomic History   Marital status: Single    Spouse name: Not on file   Number of children: Not on file   Years of education: college   Highest education level: Not on file   Occupational History   Occupation: disabled    Comment: PTSD  Tobacco Use   Smoking status: Every Day    Packs/day: 0.50    Years: 10.00    Total pack years: 5.00    Types: Cigarettes   Smokeless tobacco: Never  Vaping Use   Vaping Use: Former   Substances: THC  Substance and Sexual Activity   Alcohol use: Yes    Alcohol/week: 3.0 standard drinks of alcohol    Types: 3 Cans of beer per week    Comment: 3 beers weekly   Drug use: Yes    Types: Marijuana   Sexual activity: Yes    Partners: Male    Birth control/protection: Pill    Comment: Mirena  Other Topics Concern   Not on file  Social History Narrative   Not on file   Social Determinants of Health   Financial Resource Strain: Low Risk  (09/07/2021)   Overall Financial Resource Strain (CARDIA)    Difficulty of Paying Living Expenses: Not hard at all  Food Insecurity: Not on file  Transportation Needs: No Transportation Needs (09/07/2021)   PRAPARE - Transportation    Lack of Transportation (Medical): No    Lack of Transportation (Non-Medical): No  Physical Activity: Insufficiently Active (09/07/2021)   Exercise Vital Sign    Days of Exercise per Week: 1 day    Minutes of Exercise per Session: 40 min  Stress: Stress Concern Present (09/07/2021)   [employer] of Occupational Health - Occupational Stress Questionnaire    Feeling of Stress : To some extent  Social Connections: Socially Integrated (09/07/2021)   Social Connection and Isolation Panel [NHANES]    Frequency of Communication with Friends and Family: More than three times a week    Frequency of Social Gatherings with Friends and Family: More than three times a week    Attends Religious Services: More than 4 times per year    Active Member of [employer] or Organizations: Yes    Attends Banker Meetings: Never    Marital Status: Living with partner  Intimate Partner Violence: Not on file     Constitutional: Denies fever, malaise, fatigue,  headache or abrupt weight changes.  HEENT: Patient reports sore throat.  Denies eye pain, eye redness, ear pain, ringing in the ears, wax buildup, runny nose, nasal congestion, bloody nose. Respiratory: Pt reports cough and wheezing. Denies difficulty breathing, shortness of breath, or sputum production.   Cardiovascular: Denies chest pain, chest tightness, palpitations or swelling in the hands or feet.  Gastrointestinal: Denies abdominal pain, bloating, constipation, diarrhea or blood in the stool.   No other specific complaints in a complete review of systems (except as listed in HPI above).  Objective:   Physical Exam   BP 118/76 (BP Location: Left Arm, Patient Position: Sitting, Cuff Size: Normal)   Pulse (!) 57   Temp (!) 97.3 F (36.3 C) (Temporal)   Wt 180 lb (81.6 kg)   SpO2 99%   BMI 35.15 kg/m   Wt Readings from Last 3 Encounters:  01/04/22 177 lb (80.3 kg)  10/14/21 179 lb (81.2 kg)  09/02/21 176 lb (79.8 kg)    General: Appears her stated age, obese, in NAD. Skin: Warm, dry and intact. No rashes noted. HEENT: Head: normal shape and size; Eyes: sclera white, no icterus, conjunctiva pink, PERRLA and EOMs intact;  Throat/Mouth: Teeth present, mucosa erythematous and moist, no exudate, lesions or ulcerations noted.  Tonsilliths noted on the left tonsil. Neck: Anterior cervical adenopathy noted on the right. Cardiovascular: Bradycardic with normal rhythm. S1,S2 noted.  No murmur, rubs or gallops noted Pulmonary/Chest: Normal effort and positive vesicular breath sounds. No respiratory distress. No wheezes, rales or ronchi noted.  Neurological: Alert and oriented.   BMET    Component Value Date/Time   NA 140 10/14/2021 0901   K 3.9 10/14/2021 0901   CL 106 10/14/2021 0901   CO2 23 10/14/2021 0901   GLUCOSE 89 10/14/2021 0901   BUN 9 10/14/2021 0901   CREATININE 0.93 10/14/2021 0901   CALCIUM 9.7 10/14/2021 0901   GFRNONAA 81 05/26/2020 0947   GFRAA 94 05/26/2020  0947    Lipid Panel     Component Value Date/Time   CHOL 179 10/14/2021 0901   TRIG 93 10/14/2021 0901   HDL 85 10/14/2021 0901   CHOLHDL 2.1 10/14/2021 0901   LDLCALC 76 10/14/2021 0901    CBC    Component Value Date/Time   WBC 6.3 10/14/2021 0901   RBC 4.19 10/14/2021 0901   HGB 13.9 10/14/2021 0901   HCT 42.4 10/14/2021 0901   PLT 249 10/14/2021 0901   MCV 101.2 (H) 10/14/2021 0901   MCH 33.2 (H) 10/14/2021 0901   MCHC 32.8  10/14/2021 0901   RDW 12.5 10/14/2021 0901   LYMPHSABS 2,141 05/26/2020 0947   EOSABS 20 05/26/2020 0947   BASOSABS 29 05/26/2020 0947    Hgb A1C Lab Results  Component Value Date   HGBA1C 5.1 10/14/2021          Assessment & Plan:   Sore Throat, Cough, History of Allergies and Asthma:  Rapid strep test: Negative Likely viral in nature 80 mg Depo-Medrol IM x1 Encourage salt water gargles for comfort  RTC in 1 month for follow-up of chronic conditions Nicki Reaper, NP

## 2022-04-15 ENCOUNTER — Other Ambulatory Visit (HOSPITAL_COMMUNITY)
Admission: RE | Admit: 2022-04-15 | Discharge: 2022-04-15 | Disposition: A | Discharge status: Home / Self Care | Payer: Medicare Other | Source: Ambulatory Visit | Attending: Internal Medicine | Admitting: Internal Medicine

## 2022-04-15 ENCOUNTER — Encounter: Payer: Self-pay | Admitting: Internal Medicine

## 2022-04-15 ENCOUNTER — Ambulatory Visit (INDEPENDENT_AMBULATORY_CARE_PROVIDER_SITE_OTHER): Payer: Medicare Other | Admitting: Internal Medicine

## 2022-04-15 VITALS — BP 118/80 | HR 65 | Temp 96.8°F | Wt 180.0 lb

## 2022-04-15 DIAGNOSIS — Z6835 Body mass index (BMI) 35.0-35.9, adult: Secondary | ICD-10-CM

## 2022-04-15 DIAGNOSIS — Z113 Encounter for screening for infections with a predominantly sexual mode of transmission: Secondary | ICD-10-CM | POA: Diagnosis present

## 2022-04-15 DIAGNOSIS — J4520 Mild intermittent asthma, uncomplicated: Secondary | ICD-10-CM

## 2022-04-15 DIAGNOSIS — F319 Bipolar disorder, unspecified: Secondary | ICD-10-CM

## 2022-04-15 DIAGNOSIS — R768 Other specified abnormal immunological findings in serum: Secondary | ICD-10-CM

## 2022-04-15 DIAGNOSIS — G43009 Migraine without aura, not intractable, without status migrainosus: Secondary | ICD-10-CM | POA: Diagnosis not present

## 2022-04-15 DIAGNOSIS — L732 Hidradenitis suppurativa: Secondary | ICD-10-CM

## 2022-04-15 DIAGNOSIS — F411 Generalized anxiety disorder: Secondary | ICD-10-CM

## 2022-04-15 DIAGNOSIS — K219 Gastro-esophageal reflux disease without esophagitis: Secondary | ICD-10-CM

## 2022-04-15 NOTE — Assessment & Plan Note (Signed)
No recent flare.  

## 2022-04-15 NOTE — Assessment & Plan Note (Signed)
Continue hydroxyzine, clonazepam, buspirone and Abilify as prescribed by psychiatry

## 2022-04-15 NOTE — Assessment & Plan Note (Signed)
Continue albuterol as needed 

## 2022-04-15 NOTE — Assessment & Plan Note (Signed)
Try to identify triggers and avoid them Tylenol as needed

## 2022-04-15 NOTE — Progress Notes (Signed)
Subjective:    Patient ID: Ellen Kaiser, female    DOB: December 20, 1990, 31 y.o.   MRN: [id number]  HPI  Patient presents to clinic today for follow-up of chronic conditions.  Migraines: These occur rarely.  She is not sure what triggers them. She takes Tylenol as needed with good relief of symptoms.  She does not follow with neurology.  Asthma: She denies chronic cough or shortness of breath.  She uses Albuterol as needed with good relief of symptoms.  There are no PFTs on file.  GERD: Triggered by stressed.  She does not take any medication for this.  There is no upper GI on file.  Hidradenitis Suppurativa: She does not follow with dermatology.  Anxiety and Bipolar Depression: Chronic, managed on Abilify, Hydroxyzine, Clonazepam and Buspirone.  She is currently seeing a therapist and a psychiatrist.  She denies SI/HI.  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  She is not taking any preventative medication for this at this time.  She would also like STD screening today.  She has not had any known exposure and she is not having any symptoms at this time.  Review of Systems     Past Medical History:  Diagnosis Date   Allergy    Anxiety    Asthma    Bipolar 1 disorder (HCC)    BV (bacterial vaginosis)    Depression    HSV-2 seropositive     Current Outpatient Medications  Medication Sig Dispense Refill   ABILIFY MAINTENA 400 MG PRSY prefilled syringe Inject into the muscle.     albuterol (VENTOLIN HFA) 108 (90 Base) MCG/ACT inhaler INHALE 1 PUFF INTO THE LUNGS EVERY 6 HOURS AS NEEDED FOR WHEEZING OR SHORTNESS OF BREATH. 8.5 each 5   busPIRone (BUSPAR) 10 MG tablet TAKE 1 TABLET BY MOUTH 2 TO 3 TIMES DIALY AS NEEDED FOR ANXIETY     clonazePAM (KLONOPIN) 1 MG tablet Take 1 tablet by mouth 2 (two) times daily.     hydrOXYzine (ATARAX/VISTARIL) 50 MG tablet Take 100 mg by mouth daily.      No current facility-administered medications for this visit.    Allergies   Allergen Reactions   Fish Allergy Anaphylaxis and Swelling    Family History  Problem Relation Age of Onset   Depression Mother    Alcohol abuse Mother    Bipolar disorder Mother    Healthy Father    Healthy Sister    Osteoarthritis Maternal Grandmother    Dementia Paternal Grandmother     Social History   Socioeconomic History   Marital status: Single    Spouse name: Not on file   Number of children: Not on file   Years of education: college   Highest education level: Not on file  Occupational History   Occupation: disabled    Comment: PTSD  Tobacco Use   Smoking status: Every Day    Packs/day: 0.50    Years: 10.00    Total pack years: 5.00    Types: Cigarettes   Smokeless tobacco: Never  Vaping Use   Vaping Use: Former   Substances: THC  Substance and Sexual Activity   Alcohol use: Yes    Alcohol/week: 3.0 standard drinks of alcohol    Types: 3 Cans of beer per week    Comment: 3 beers weekly   Drug use: Yes    Types: Marijuana   Sexual activity: Yes    Partners: Male    Birth control/protection: Pill  Comment: Mirena  Other Topics Concern   Not on file  Social History Narrative   Not on file   Social Determinants of Health   Financial Resource Strain: Low Risk  (09/07/2021)   Overall Financial Resource Strain (CARDIA)    Difficulty of Paying Living Expenses: Not hard at all  Food Insecurity: Not on file  Transportation Needs: No Transportation Needs (09/07/2021)   PRAPARE - Hydrologist (Medical): No    Lack of Transportation (Non-Medical): No  Physical Activity: Insufficiently Active (09/07/2021)   Exercise Vital Sign    Days of Exercise per Week: 1 day    Minutes of Exercise per Session: 40 min  Stress: Stress Concern Present (09/07/2021)   [place]    Feeling of Stress : To some extent  Social Connections: Socially Integrated (09/07/2021)   Social  Connection and Isolation Panel [NHANES]    Frequency of Communication with Friends and Family: More than three times a week    Frequency of Social Gatherings with Friends and Family: More than three times a week    Attends Religious Services: More than 4 times per year    Active Member of [employer] or Organizations: Yes    Attends Archivist Meetings: Never    Marital Status: Living with partner  Intimate Partner Violence: Not on file     Constitutional: Patient reports intermittent headaches.  Denies fever, malaise, fatigue, or abrupt weight changes.  HEENT: Denies eye pain, eye redness, ear pain, ringing in the ears, wax buildup, runny nose, nasal congestion, bloody nose, or sore throat. Respiratory: Denies difficulty breathing, shortness of breath, cough or sputum production.   Cardiovascular: Denies chest pain, chest tightness, palpitations or swelling in the hands or feet.  Gastrointestinal: Denies abdominal pain, bloating, constipation, diarrhea or blood in the stool.  GU: Denies urgency, frequency, pain with urination, burning sensation, blood in urine, odor or discharge. Musculoskeletal: Denies decrease in range of motion, difficulty with gait, muscle pain or joint pain and swelling.  Skin: Denies redness, rashes, lesions or ulcercations.  Neurological: Denies dizziness, difficulty with memory, difficulty with speech or problems with balance and coordination.  Psych: Patient has a history of anxiety and depression.  Denies SI/HI.  No other specific complaints in a complete review of systems (except as listed in HPI above).  Objective:   Physical Exam   BP 118/80 (BP Location: Right Arm, Patient Position: Sitting, Cuff Size: Normal)   Pulse 65   Temp (!) 96.8 F (36 C) (Temporal)   Wt 180 lb (81.6 kg)   SpO2 100%   BMI 35.15 kg/m   Wt Readings from Last 3 Encounters:  02/25/22 180 lb (81.6 kg)  01/04/22 177 lb (80.3 kg)  10/14/21 179 lb (81.2 kg)    General:  Appears her stated age, obese, in NAD. Skin: Warm, dry and intact.  HEENT: Head: normal shape and size; Eyes: sclera white, no icterus, conjunctiva pink, PERRLA and EOMs intact;  Cardiovascular: Normal rate and rhythm. S1,S2 noted.  No murmur, rubs or gallops noted. No JVD or BLE edema.  Pulmonary/Chest: Normal effort and positive vesicular breath sounds. No respiratory distress. No wheezes, rales or ronchi noted.  Abdomen: Normal bowel sounds.  Musculoskeletal: No difficulty with gait.  Neurological: Alert and oriented.  Psychiatric: Mood and affect normal. Behavior is normal. Judgment and thought content normal.    BMET    Component Value Date/Time   NA  140 10/14/2021 0901   K 3.9 10/14/2021 0901   CL 106 10/14/2021 0901   CO2 23 10/14/2021 0901   GLUCOSE 89 10/14/2021 0901   BUN 9 10/14/2021 0901   CREATININE 0.93 10/14/2021 0901   CALCIUM 9.7 10/14/2021 0901   GFRNONAA 81 05/26/2020 0947   GFRAA 94 05/26/2020 0947    Lipid Panel     Component Value Date/Time   CHOL 179 10/14/2021 0901   TRIG 93 10/14/2021 0901   HDL 85 10/14/2021 0901   CHOLHDL 2.1 10/14/2021 0901   LDLCALC 76 10/14/2021 0901    CBC    Component Value Date/Time   WBC 6.3 10/14/2021 0901   RBC 4.19 10/14/2021 0901   HGB 13.9 10/14/2021 0901   HCT 42.4 10/14/2021 0901   PLT 249 10/14/2021 0901   MCV 101.2 (H) 10/14/2021 0901   MCH 33.2 (H) 10/14/2021 0901   MCHC 32.8 10/14/2021 0901   RDW 12.5 10/14/2021 0901   LYMPHSABS 2,141 05/26/2020 0947   EOSABS 20 05/26/2020 0947   BASOSABS 29 05/26/2020 0947    Hgb A1C Lab Results  Component Value Date   HGBA1C 5.1 10/14/2021           Assessment & Plan:   Screen for STD:  Wet prep to check for gonorrhea, chlamydia and trichomonas We will check HIV, RPR and hep C today  RTC in 6 months for your annual exam Nicki Reaper, NP

## 2022-04-15 NOTE — Assessment & Plan Note (Signed)
Continue Abilify, clonazepam, hydroxyzine and buspirone as prescribed by psychiatry

## 2022-04-15 NOTE — Assessment & Plan Note (Signed)
Currently not an issue °We will monitor °

## 2022-04-15 NOTE — Assessment & Plan Note (Signed)
No recent flare We will monitor

## 2022-04-15 NOTE — Assessment & Plan Note (Signed)
Encourage diet and exercise for weight loss 

## 2022-04-15 NOTE — Patient Instructions (Signed)
Asthma Action Plan, Adult An asthma action plan helps you understand how to manage your asthma and what to do when you have an asthma attack. The action plan is a color-coded plan that lists the symptoms that indicate whether or not your condition is under control and what actions to take. If you have symptoms in the green zone, you are doing well. If you have symptoms in the yellow zone, you are having problems. If you have symptoms in the red zone, you need medical care right away. Follow the plan that you and your health care provider develop. Review your plan with your health care provider at each visit. What triggers your asthma? Knowing the things that can trigger an asthma attack or make your asthma symptoms worse is very important. Talk to your health care provider about your asthma triggers and how to avoid them. Record your known asthma triggers here: _______________ What is your personal best peak flow reading? If you use a peak flow meter, determine your personal best reading. Record it here: _______________ Green zone This zone means that your asthma is under control. You may not have any symptoms while you are in the green zone. This means that you: Have no coughing or wheezing, even while you are working or playing. Sleep through the night. Are breathing well. Have a peak flow reading that is above __________ (80% of your personal best or greater). If you are in the green zone, continue to manage your asthma as directed. Take these medicines every day: Controller medicine and dosage: _______________ Controller medicine and dosage: _______________ Controller medicine and dosage: _______________ Controller medicine and dosage: _______________ Before exercise, use this reliever or rescue medicine: _______________ Call your health care provider if you are using a reliever or rescue medicine more than 2-3 times a week. Yellow zone Symptoms in this zone mean that your condition may  be getting worse. You may have symptoms that interfere with exercise, are noticeably worse after exposure to triggers, or are worse at the first sign of a cold (upper respiratory infection). These may include: Waking from sleep. Coughing, especially at night or first thing in the morning. Mild wheezing. Chest tightness. A peak flow reading that is __________ to __________ (50-79% of your personal best). If you have any of these symptoms: Add the following medicine to the ones that you use daily: Reliever or rescue medicine and dosage: _______________ Additional medicine and dosage: _______________ Call your health care provider if: You remain in the yellow zone for __________ hours. You are using a reliever or rescue medicine more than 2-3 times a week. Red zone Symptoms in this zone mean that you should get medical help right away. You will likely feel distressed and have symptoms at rest that restrict your activity. You are in the red zone if: You are breathing hard and quickly. Your nose opens wide, your ribs show, and your neck muscles become visible when you breathe in. Your lips, fingers, or toes are a bluish color. You have trouble speaking in full sentences. Your peak flow reading is less than __________ (less than 50% of your personal best). Your symptoms do not improve within 15-20 minutes after you use your reliever or rescue medicine (bronchodilator). If you have any of these symptoms: These symptoms represent a serious problem that is an emergency. Do not wait to see if the symptoms will go away. Get medical help right away. Call your local emergency services (911 in the [place] Do not drive yourself   to the hospital. Use your reliever or rescue medicine. Start a nebulizer treatment or take 2-4 puffs from a metered-dose inhaler with a spacer. Repeat this action every 15-20 minutes until help arrives. Where to find more information You can find more information about asthma  from: Centers for Disease Control and Prevention: www.cdc.gov American Lung Association: www.lung.org This information is not intended to replace advice given to you by your health care provider. Make sure you discuss any questions you have with your health care provider. Document Revised: 07/31/2020 Document Reviewed: 07/31/2020 Elsevier Patient Education  2023 Elsevier Inc.  

## 2022-04-16 LAB — CERVICOVAGINAL ANCILLARY ONLY
Chlamydia: NEGATIVE
Comment: NEGATIVE
Comment: NEGATIVE
Comment: NORMAL
Neisseria Gonorrhea: NEGATIVE
Trichomonas: NEGATIVE

## 2022-04-16 LAB — HEPATITIS C ANTIBODY: Hepatitis C Ab: NONREACTIVE

## 2022-04-16 LAB — HIV ANTIBODY (ROUTINE TESTING W REFLEX): HIV 1&2 Ab, 4th Generation: NONREACTIVE

## 2022-04-16 LAB — RPR: RPR Ser Ql: NONREACTIVE

## 2022-07-16 ENCOUNTER — Encounter: Payer: Self-pay | Admitting: Internal Medicine

## 2022-07-16 ENCOUNTER — Ambulatory Visit (INDEPENDENT_AMBULATORY_CARE_PROVIDER_SITE_OTHER): Payer: 59 | Admitting: Internal Medicine

## 2022-07-16 VITALS — BP 118/72 | HR 60 | Temp 97.5°F | Wt 181.0 lb

## 2022-07-16 DIAGNOSIS — K591 Functional diarrhea: Secondary | ICD-10-CM

## 2022-07-16 NOTE — Progress Notes (Signed)
Subjective:    Patient ID: Ellen Kaiser, female    DOB: 11-Jul-1990, 32 y.o.   MRN: [id number]  HPI  Patient presents to clinic today with complaint of diarrhea.  This started 1 week ago. She reports up to 3 loose stools per day. She reports the food seems to run right through her. She does report an odor. She denies nausea, vomiting, abdominal pain or blood in the stool.  She denies recent changes in diet or medications.  She has not had sick contacts with similar symptoms that she is aware of.  She does have a history of anxiety and depression but has never been diagnosed with IBS.  She has not tried anything OTC for this.   Review of Systems     Past Medical History:  Diagnosis Date   Allergy    Anxiety    Asthma    Bipolar 1 disorder (HCC)    BV (bacterial vaginosis)    Depression    HSV-2 seropositive     Current Outpatient Medications  Medication Sig Dispense Refill   ABILIFY MAINTENA 400 MG PRSY prefilled syringe Inject into the muscle.     albuterol (VENTOLIN HFA) 108 (90 Base) MCG/ACT inhaler INHALE 1 PUFF INTO THE LUNGS EVERY 6 HOURS AS NEEDED FOR WHEEZING OR SHORTNESS OF BREATH. 8.5 each 5   busPIRone (BUSPAR) 10 MG tablet TAKE 1 TABLET BY MOUTH 2 TO 3 TIMES DIALY AS NEEDED FOR ANXIETY     clonazePAM (KLONOPIN) 1 MG tablet Take 1 tablet by mouth 2 (two) times daily.     hydrOXYzine (ATARAX/VISTARIL) 50 MG tablet Take 100 mg by mouth daily.      No current facility-administered medications for this visit.    Allergies  Allergen Reactions   Fish Allergy Anaphylaxis and Swelling    Family History  Problem Relation Age of Onset   Depression Mother    Alcohol abuse Mother    Bipolar disorder Mother    Healthy Father    Healthy Sister    Osteoarthritis Maternal Grandmother    Dementia Paternal Grandmother     Social History   Socioeconomic History   Marital status: Single    Spouse name: Not on file   Number of children: Not on file   Years of  education: college   Highest education level: Not on file  Occupational History   Occupation: disabled    Comment: PTSD  Tobacco Use   Smoking status: Every Day    Packs/day: 0.50    Years: 10.00    Total pack years: 5.00    Types: Cigarettes   Smokeless tobacco: Never  Vaping Use   Vaping Use: Former   Substances: THC  Substance and Sexual Activity   Alcohol use: Yes    Alcohol/week: 3.0 standard drinks of alcohol    Types: 3 Cans of beer per week    Comment: 3 beers weekly   Drug use: Yes    Types: Marijuana   Sexual activity: Yes    Partners: Male    Birth control/protection: Pill    Comment: Mirena  Other Topics Concern   Not on file  Social History Narrative   Not on file   Social Determinants of Health   Financial Resource Strain: Low Risk  (09/07/2021)   Overall Financial Resource Strain (CARDIA)    Difficulty of Paying Living Expenses: Not hard at all  Food Insecurity: Not on file  Transportation Needs: No Transportation Needs (09/07/2021)   PRAPARE -  Administrator, Civil Service (Medical): No    Lack of Transportation (Non-Medical): No  Physical Activity: Insufficiently Active (09/07/2021)   Exercise Vital Sign    Days of Exercise per Week: 1 day    Minutes of Exercise per Session: 40 min  Stress: Stress Concern Present (09/07/2021)   [employer] of Occupational Health - Occupational Stress Questionnaire    Feeling of Stress : To some extent  Social Connections: Socially Integrated (09/07/2021)   Social Connection and Isolation Panel [NHANES]    Frequency of Communication with Friends and Family: More than three times a week    Frequency of Social Gatherings with Friends and Family: More than three times a week    Attends Religious Services: More than 4 times per year    Active Member of [employer] or Organizations: Yes    Attends Banker Meetings: Never    Marital Status: Living with partner  Intimate Partner Violence: Not on file      Constitutional: Denies fever, malaise, fatigue, headache or abrupt weight changes.  Respiratory: Denies difficulty breathing, shortness of breath, cough or sputum production.   Cardiovascular: Denies chest pain, chest tightness, palpitations or swelling in the hands or feet.  Gastrointestinal: Pt reports diarrhea. Denies abdominal pain, bloating, constipation, or blood in the stool.  GU: Denies urgency, frequency, pain with urination, burning sensation, blood in urine, odor or discharge. Skin: Denies redness, rashes, lesions or ulcercations.   No other specific complaints in a complete review of systems (except as listed in HPI above).  Objective:   Physical Exam   BP 118/72 (BP Location: Right Arm, Patient Position: Sitting, Cuff Size: Normal)   Pulse 60   Temp (!) 97.5 F (36.4 C) (Temporal)   Wt 181 lb (82.1 kg)   SpO2 99%   BMI 35.35 kg/m   Wt Readings from Last 3 Encounters:  04/15/22 180 lb (81.6 kg)  02/25/22 180 lb (81.6 kg)  01/04/22 177 lb (80.3 kg)    General: Appears her stated age, obese in NAD. Cardiovascular: Normal rate and rhythm. S1,S2 noted.  No murmur, rubs or gallops noted.  Pulmonary/Chest: Normal effort and positive vesicular breath sounds. No respiratory distress. No wheezes, rales or ronchi noted.  Abdomen: Soft and nontender. Normal bowel sounds. No distention or masses noted. Liver, spleen and kidneys non palpable. Musculoskeletal: No difficulty with gait.  Neurological: Alert and oriented.    BMET    Component Value Date/Time   NA 140 10/14/2021 0901   K 3.9 10/14/2021 0901   CL 106 10/14/2021 0901   CO2 23 10/14/2021 0901   GLUCOSE 89 10/14/2021 0901   BUN 9 10/14/2021 0901   CREATININE 0.93 10/14/2021 0901   CALCIUM 9.7 10/14/2021 0901   GFRNONAA 81 05/26/2020 0947   GFRAA 94 05/26/2020 0947    Lipid Panel     Component Value Date/Time   CHOL 179 10/14/2021 0901   TRIG 93 10/14/2021 0901   HDL 85 10/14/2021 0901   CHOLHDL  2.1 10/14/2021 0901   LDLCALC 76 10/14/2021 0901    CBC    Component Value Date/Time   WBC 6.3 10/14/2021 0901   RBC 4.19 10/14/2021 0901   HGB 13.9 10/14/2021 0901   HCT 42.4 10/14/2021 0901   PLT 249 10/14/2021 0901   MCV 101.2 (H) 10/14/2021 0901   MCH 33.2 (H) 10/14/2021 0901   MCHC 32.8 10/14/2021 0901   RDW 12.5 10/14/2021 0901   LYMPHSABS 2,141 05/26/2020 7829  EOSABS 20 05/26/2020 0947   BASOSABS 29 05/26/2020 0947    Hgb A1C Lab Results  Component Value Date   HGBA1C 5.1 10/14/2021          Assessment & Plan:   Diarrhea:  Could be viral versus IBS, less likely infectious diarrhea Advised her to try Imodium OTC according O & P, GI pathogen panel  RTC in 3 months for your annual exam Webb Silversmith, NP

## 2022-07-16 NOTE — Patient Instructions (Signed)
Diarrhea, Adult Diarrhea is when you pass loose and sometimes watery poop (stool) often. Diarrhea can make you feel weak and cause you to lose water in your body (get dehydrated). Losing water in your body can cause you to: Feel tired and thirsty. Have a dry mouth. Go pee (urinate) less often. Diarrhea often lasts 2-3 days. It can last longer if it is a sign of something more serious. Be sure to treat your diarrhea as told by your doctor. Follow these instructions at home: Eating and drinking     Follow these instructions as told by your doctor: Take an ORS (oral rehydration solution). This is a drink that helps you replace fluids and minerals your body lost. It is sold at pharmacies and stores. Drink enough fluid to keep your pee (urine) pale yellow. Drink fluids such as: Water. You can also get fluids by sucking on ice chips. Diluted fruit juice. Low-calorie sports drinks. Milk. Avoid drinking fluids that have a lot of sugar or caffeine in them. These include soda, energy drinks, and regular sports drinks. Avoid alcohol. Eat bland, easy-to-digest foods in small amounts as you are able. These foods include: Bananas. Applesauce. Rice. Low-fat (lean) meats. Toast. Crackers. Avoid spicy or fatty foods.  Medicines Take over-the-counter and prescription medicines only as told by your doctor. If you were prescribed antibiotics, take them as told by your doctor. Do not stop taking them even if you start to feel better. General instructions  Wash your hands often using soap and water for 20 seconds. If soap and water are not available, use hand sanitizer. Others in your home should wash their hands as well. Wash your hands: After using the toilet or changing a diaper. Before preparing, cooking, or serving food. While caring for a sick person. While visiting someone in a hospital. Rest at home while you get better. Take a warm bath to help with any burning or pain from having  diarrhea. Watch your condition for any changes. Contact a doctor if: You have a fever. Your diarrhea gets worse. You have new symptoms. You vomit every time you eat or drink. You feel light-headed, dizzy, or you have a headache. You have muscle cramps. You have signs of losing too much water in your body, such as: Dark pee, very little pee, or no pee. Cracked lips. Dry mouth. Sunken eyes. Sleepiness. Weakness. You have bloody or black poop or poop that looks like tar. You have very bad pain, cramping, or bloating in your belly (abdomen). Your skin feels cold and clammy. You feel confused. Get help right away if: You have chest pain. Your heart is beating very quickly. You have trouble breathing or you are breathing very quickly. You feel very weak or you faint. These symptoms may be an emergency. Get help right away. Call 911. Do not wait to see if the symptoms will go away. Do not drive yourself to the hospital. This information is not intended to replace advice given to you by your health care provider. Make sure you discuss any questions you have with your health care provider. Document Revised: 11/24/2021 Document Reviewed: 11/24/2021 Elsevier Patient Education  [place].

## 2022-07-30 ENCOUNTER — Other Ambulatory Visit (HOSPITAL_COMMUNITY)
Admission: RE | Admit: 2022-07-30 | Discharge: 2022-07-30 | Disposition: A | Discharge status: Home / Self Care | Payer: 59 | Source: Ambulatory Visit | Attending: Internal Medicine | Admitting: Internal Medicine

## 2022-07-30 ENCOUNTER — Ambulatory Visit (INDEPENDENT_AMBULATORY_CARE_PROVIDER_SITE_OTHER): Payer: 59 | Admitting: Internal Medicine

## 2022-07-30 ENCOUNTER — Encounter: Payer: Self-pay | Admitting: Internal Medicine

## 2022-07-30 VITALS — BP 122/64 | HR 83 | Temp 96.6°F | Wt 182.0 lb

## 2022-07-30 DIAGNOSIS — Z113 Encounter for screening for infections with a predominantly sexual mode of transmission: Secondary | ICD-10-CM | POA: Diagnosis present

## 2022-07-30 DIAGNOSIS — B360 Pityriasis versicolor: Secondary | ICD-10-CM

## 2022-07-30 MED ORDER — SELENIUM SULFIDE 2.5 % EX LOTN: 1.0000 | TOPICAL_LOTION | Freq: Every day | CUTANEOUS | 1 refills | Status: DC | PRN

## 2022-07-30 NOTE — Progress Notes (Signed)
Subjective:    Patient ID: Ellen Kaiser, female    DOB: August 26, 1990, 32 y.o.   MRN: [id number]  HPI  Patient presents to clinic today requesting STD testing. She is not currently having any symptoms. She is sexually active but has not had any known exposure.  She also reports intense itching on her back.  She noticed this 1 month ago.  She has a history of eczema and is not sure if it has flared up.  She has tried cocoa butter and oil OTC with minimal relief of symptoms.  Review of Systems     Past Medical History:  Diagnosis Date   Allergy    Anxiety    Asthma    Bipolar 1 disorder (HCC)    BV (bacterial vaginosis)    Depression    HSV-2 seropositive     Current Outpatient Medications  Medication Sig Dispense Refill   ABILIFY MAINTENA 400 MG PRSY prefilled syringe Inject into the muscle.     albuterol (VENTOLIN HFA) 108 (90 Base) MCG/ACT inhaler INHALE 1 PUFF INTO THE LUNGS EVERY 6 HOURS AS NEEDED FOR WHEEZING OR SHORTNESS OF BREATH. 8.5 each 5   busPIRone (BUSPAR) 10 MG tablet TAKE 1 TABLET BY MOUTH 2 TO 3 TIMES DIALY AS NEEDED FOR ANXIETY     clonazePAM (KLONOPIN) 1 MG tablet Take 1 tablet by mouth 2 (two) times daily.     hydrOXYzine (ATARAX/VISTARIL) 50 MG tablet Take 100 mg by mouth daily.      No current facility-administered medications for this visit.    Allergies  Allergen Reactions   Fish Allergy Anaphylaxis and Swelling    Family History  Problem Relation Age of Onset   Depression Mother    Alcohol abuse Mother    Bipolar disorder Mother    Healthy Father    Healthy Sister    Osteoarthritis Maternal Grandmother    Dementia Paternal Grandmother     Social History   Socioeconomic History   Marital status: Single    Spouse name: Not on file   Number of children: Not on file   Years of education: college   Highest education level: Not on file  Occupational History   Occupation: disabled    Comment: PTSD  Tobacco Use   Smoking status:  Every Day    Packs/day: 0.50    Years: 10.00    Total pack years: 5.00    Types: Cigarettes   Smokeless tobacco: Never  Vaping Use   Vaping Use: Former   Substances: THC  Substance and Sexual Activity   Alcohol use: Yes    Alcohol/week: 3.0 standard drinks of alcohol    Types: 3 Cans of beer per week    Comment: 3 beers weekly   Drug use: Yes    Types: Marijuana   Sexual activity: Yes    Partners: Male    Birth control/protection: Pill    Comment: Mirena  Other Topics Concern   Not on file  Social History Narrative   Not on file   Social Determinants of Health   Financial Resource Strain: Low Risk  (09/07/2021)   Overall Financial Resource Strain (CARDIA)    Difficulty of Paying Living Expenses: Not hard at all  Food Insecurity: Not on file  Transportation Needs: No Transportation Needs (09/07/2021)   PRAPARE - Transportation    Lack of Transportation (Medical): No    Lack of Transportation (Non-Medical): No  Physical Activity: Insufficiently Active (09/07/2021)   Exercise Vital Sign  Days of Exercise per Week: 1 day    Minutes of Exercise per Session: 40 min  Stress: Stress Concern Present (09/07/2021)   [place]    Feeling of Stress : To some extent  Social Connections: Socially Integrated (09/07/2021)   Social Connection and Isolation Panel [NHANES]    Frequency of Communication with Friends and Family: More than three times a week    Frequency of Social Gatherings with Friends and Family: More than three times a week    Attends Religious Services: More than 4 times per year    Active Member of [employer] or Organizations: Yes    Attends Archivist Meetings: Never    Marital Status: Living with partner  Intimate Partner Violence: Not on file     Constitutional: Denies fever, malaise, fatigue, headache or abrupt weight changes.  Respiratory: Denies difficulty breathing, shortness of breath,  cough or sputum production.   Cardiovascular: Denies chest pain, chest tightness, palpitations or swelling in the hands or feet.  Gastrointestinal: Denies abdominal pain, bloating, constipation, diarrhea or blood in the stool.  GU: Denies urgency, frequency, pain with urination, burning sensation, blood in urine, odor or discharge. Skin: Patient reports itching of back.  Denies redness, rashes, lesions or ulcercations.   No other specific complaints in a complete review of systems (except as listed in HPI above).  Objective:   Physical Exam BP 122/64 (BP Location: Left Arm, Patient Position: Sitting, Cuff Size: Normal)   Pulse 83   Temp (!) 96.6 F (35.9 C) (Temporal)   Wt 182 lb (82.6 kg)   SpO2 100%   BMI 35.54 kg/m   Wt Readings from Last 3 Encounters:  07/16/22 181 lb (82.1 kg)  04/15/22 180 lb (81.6 kg)  02/25/22 180 lb (81.6 kg)    General: Appears her stated age, obese in NAD. Skin: Hypopigmented rash noted of back. Cardiovascular: Normal rate and rhythm.  Pulmonary/Chest: Normal effort and positive vesicular breath sounds.  Pelvic: Self swab.   Neurological: Alert and oriented.    BMET    Component Value Date/Time   NA 140 10/14/2021 0901   K 3.9 10/14/2021 0901   CL 106 10/14/2021 0901   CO2 23 10/14/2021 0901   GLUCOSE 89 10/14/2021 0901   BUN 9 10/14/2021 0901   CREATININE 0.93 10/14/2021 0901   CALCIUM 9.7 10/14/2021 0901   GFRNONAA 81 05/26/2020 0947   GFRAA 94 05/26/2020 0947    Lipid Panel     Component Value Date/Time   CHOL 179 10/14/2021 0901   TRIG 93 10/14/2021 0901   HDL 85 10/14/2021 0901   CHOLHDL 2.1 10/14/2021 0901   LDLCALC 76 10/14/2021 0901    CBC    Component Value Date/Time   WBC 6.3 10/14/2021 0901   RBC 4.19 10/14/2021 0901   HGB 13.9 10/14/2021 0901   HCT 42.4 10/14/2021 0901   PLT 249 10/14/2021 0901   MCV 101.2 (H) 10/14/2021 0901   MCH 33.2 (H) 10/14/2021 0901   MCHC 32.8 10/14/2021 0901   RDW 12.5 10/14/2021  0901   LYMPHSABS 2,141 05/26/2020 0947   EOSABS 20 05/26/2020 0947   BASOSABS 29 05/26/2020 0947    Hgb A1C Lab Results  Component Value Date   HGBA1C 5.1 10/14/2021            Assessment & Plan:   Screen for STD:  Wet prep obtained to check for gonorrhea, chlamydia and trichomoniasis Encourage safe sexual  practices  Continue Versicolor:  Rx for selenium sulfide lotion daily x 4 weeks RTC in 2 months for annual exam Webb Silversmith, NP

## 2022-07-30 NOTE — Patient Instructions (Signed)
Safe Sex Practicing safe sex means taking steps before and during sex to reduce your risk of: Getting an STI (sexually transmitted infection). Giving your partner an STI. Unwanted or unplanned pregnancy. How to practice safe sex Ways you can practice safe sex  Limit your sexual partners to only one partner who is having sex with only you. Avoid using alcohol and drugs before having sex. Alcohol and drugs can affect your judgment. Before having sex with a new partner: Talk to your partner about past partners, past STIs, and drug use. Get screened for STIs and discuss the results with your partner. Ask your partner to get screened too. Check your body regularly for sores, blisters, rashes, or unusual discharge. If you notice any of these problems, visit your health care provider. Avoid sexual contact if you have symptoms of an infection or you are being treated for an STI. While having sex, use a condom. Make sure to: Use a condom every time you have vaginal, oral, or anal sex. Both females and males should wear condoms during oral sex. Keep condoms in place from the beginning to the end of sexual activity. Use a latex condom, if possible. Latex condoms offer the best protection. Use only water-based lubricants with a condom. Using petroleum-based lubricants or oils will weaken the condom and increase the chance that it will break. Ways your health care provider can help you practice safe sex  See your health care provider for regular screenings, exams, and tests for STIs. Talk with your health care provider about what kind of birth control (contraception) is best for you. Get vaccinated against hepatitis B and human papillomavirus (HPV). If you are at risk of being infected with HIV (human immunodeficiency virus), talk with your health care provider about taking a prescription medicine to prevent HIV infection. You are at risk for HIV if you: Are a man who has sex with other men. Are  sexually active with more than one partner. Take drugs by injection. Have a sex partner who has HIV. Have unprotected sex. Have sex with someone who has sex with both men and women. Have had an STI. Follow these instructions at home: Take over-the-counter and prescription medicines only as told by your health care provider. Keep all follow-up visits. This is important. Where to find more information Centers for Disease Control and Prevention: www.cdc.gov Planned Parenthood: www.plannedparenthood.org Office on Women's Health: www.womenshealth.gov Summary Practicing safe sex means taking steps before and during sex to reduce your risk getting an STI, giving your partner an STI, and having an unwanted or unplanned pregnancy. Before having sex with a new partner, talk to your partner about past partners, past STIs, and drug use. Use a condom every time you have vaginal, oral, or anal sex. Both females and males should wear condoms during oral sex. Check your body regularly for sores, blisters, rashes, or unusual discharge. If you notice any of these problems, visit your health care provider. See your health care provider for regular screenings, exams, and tests for STIs. This information is not intended to replace advice given to you by your health care provider. Make sure you discuss any questions you have with your health care provider. Document Revised: 11/12/2019 Document Reviewed: 11/12/2019 Elsevier Patient Education  2023 Elsevier Inc.  

## 2022-08-04 LAB — CERVICOVAGINAL ANCILLARY ONLY
Chlamydia: NEGATIVE
Comment: NEGATIVE
Comment: NEGATIVE
Comment: NORMAL
Neisseria Gonorrhea: NEGATIVE
Trichomonas: NEGATIVE

## 2022-08-17 ENCOUNTER — Telehealth: Payer: 59 | Admitting: Physician Assistant

## 2022-08-17 ENCOUNTER — Ambulatory Visit: Payer: 59

## 2022-08-17 DIAGNOSIS — A084 Viral intestinal infection, unspecified: Secondary | ICD-10-CM | POA: Diagnosis not present

## 2022-08-17 MED ORDER — DICYCLOMINE HCL 20 MG PO TABS: 20.0000 mg | ORAL_TABLET | Freq: Two times a day (BID) | ORAL | 0 refills | Status: DC

## 2022-08-17 MED ORDER — ONDANSETRON 4 MG PO TBDP: 4.0000 mg | ORAL_TABLET | Freq: Three times a day (TID) | ORAL | 0 refills | Status: DC | PRN

## 2022-08-17 NOTE — Progress Notes (Signed)
Virtual Visit Consent   Ellen Kaiser, you are scheduled for a virtual visit with a [facility] provider today. Just as with appointments in the office, your consent must be obtained to participate. Your consent will be active for this visit and any virtual visit you may have with one of our providers in the next 365 days. If you have a MyChart account, a copy of this consent can be sent to you electronically.  As this is a virtual visit, video technology does not allow for your provider to perform a traditional examination. This may limit your provider's ability to fully assess your condition. If your provider identifies any concerns that need to be evaluated in person or the need to arrange testing (such as labs, EKG, etc.), we will make arrangements to do so. Although advances in technology are sophisticated, we cannot ensure that it will always work on either your end or our end. If the connection with a video visit is poor, the visit may have to be switched to a telephone visit. With either a video or telephone visit, we are not always able to ensure that we have a secure connection.  By engaging in this virtual visit, you consent to the provision of healthcare and authorize for your insurance to be billed (if applicable) for the services provided during this visit. Depending on your insurance coverage, you may receive a charge related to this service.  I need to obtain your verbal consent now. Are you willing to proceed with your visit today? Trecia Sofield Burbage has provided verbal consent on 08/17/2022 for a virtual visit (video or telephone). Mar Daring, PA-C  Date: 08/17/2022 12:14 PM  Virtual Visit via Video Note   IMar Daring, connected with  Ellen Kaiser  ([id number], [birth year]) on 08/17/22 at 12:00 PM EST by a video-enabled telemedicine application and verified that I am speaking with the correct person using two identifiers.  Location: Patient:  Virtual Visit Location Patient: Home Provider: Virtual Visit Location Provider: Home Office   I discussed the limitations of evaluation and management by telemedicine and the availability of in person appointments. The patient expressed understanding and agreed to proceed.    History of Present Illness: Ellen Kaiser is a 33 y.o. who identifies as a female who was assigned female at birth, and is being seen today for nausea, vomiting, abdominal cramping.  HPI: GI Problem The primary symptoms include fever (now improved), fatigue, abdominal pain, nausea, vomiting, diarrhea and myalgias. Primary symptoms do not include melena, hematemesis or hematochezia. The illness began 3 to 5 days ago. The onset was sudden. The problem has not changed since onset. The illness is also significant for anorexia. The illness does not include chills, bloating, tenesmus or back pain.   Tried stool softeners, laxative, ginger ale and push fluids.  Problems:  Patient Active Problem List   Diagnosis Date Noted   Class 2 obesity due to excess calories with body mass index (BMI) of 35.0 to 35.9 in adult 04/13/2021   Hidradenitis suppurativa 12/28/2019   GAD (generalized anxiety disorder) 10/08/2019   GERD (gastroesophageal reflux disease) 10/08/2019   HSV-2 seropositive 05/23/2019   Bipolar 1 disorder ([facility]) 04/02/2019   Migraine without aura and without status migrainosus, not intractable 04/02/2019   Mild intermittent asthma without complication 0000000    Allergies:  Allergies  Allergen Reactions   Fish Allergy Anaphylaxis and Swelling   Medications:  Current Outpatient Medications:    dicyclomine (BENTYL) 20  MG tablet, Take 1 tablet (20 mg total) by mouth in the morning and at bedtime., Disp: 20 tablet, Rfl: 0   ondansetron (ZOFRAN-ODT) 4 MG disintegrating tablet, Take 1 tablet (4 mg total) by mouth every 8 (eight) hours as needed., Disp: 20 tablet, Rfl: 0   ABILIFY MAINTENA 400 MG PRSY  prefilled syringe, Inject into the muscle., Disp: , Rfl:    albuterol (VENTOLIN HFA) 108 (90 Base) MCG/ACT inhaler, INHALE 1 PUFF INTO THE LUNGS EVERY 6 HOURS AS NEEDED FOR WHEEZING OR SHORTNESS OF BREATH., Disp: 8.5 each, Rfl: 5   busPIRone (BUSPAR) 10 MG tablet, TAKE 1 TABLET BY MOUTH 2 TO 3 TIMES DIALY AS NEEDED FOR ANXIETY, Disp: , Rfl:    clonazePAM (KLONOPIN) 1 MG tablet, Take 1 tablet by mouth 2 (two) times daily., Disp: , Rfl:    hydrOXYzine (ATARAX/VISTARIL) 50 MG tablet, Take 100 mg by mouth daily. , Disp: , Rfl:    selenium sulfide (SELSUN) 2.5 % lotion, Apply 1 Application topically daily as needed for irritation., Disp: 118 mL, Rfl: 1  Observations/Objective: Patient is well-developed, well-nourished in no acute distress.  Resting comfortably at home.  Head is normocephalic, atraumatic.  No labored breathing.  Speech is clear and coherent with logical content.  Patient is alert and oriented at baseline.    Assessment and Plan: 1. Viral gastroenteritis - ondansetron (ZOFRAN-ODT) 4 MG disintegrating tablet; Take 1 tablet (4 mg total) by mouth every 8 (eight) hours as needed.  Dispense: 20 tablet; Refill: 0 - dicyclomine (BENTYL) 20 MG tablet; Take 1 tablet (20 mg total) by mouth in the morning and at bedtime.  Dispense: 20 tablet; Refill: 0  - Suspect viral gastroenteritis - Zofran for nausea, Bentyl for cramping - Push fluids, electrolyte beverages - Liquid diet, then increase to soft/bland (BRAT) diet over next day, then increase diet as tolerated - Seek in person evaluation if not improving or symptoms worsen   Follow Up Instructions: I discussed the assessment and treatment plan with the patient. The patient was provided an opportunity to ask questions and all were answered. The patient agreed with the plan and demonstrated an understanding of the instructions.  A copy of instructions were sent to the patient via MyChart unless otherwise noted below.    The patient was  advised to call back or seek an in-person evaluation if the symptoms worsen or if the condition fails to improve as anticipated.  Time:  I spent 15 minutes with the patient via telehealth technology discussing the above problems/concerns.    Mar Daring, PA-C

## 2022-08-17 NOTE — Patient Instructions (Signed)
Ellen Kaiser, thank you for joining Mar Daring, PA-C for today's virtual visit.  While this provider is not your primary care provider (PCP), if your PCP is located in our provider database this encounter information will be shared with them immediately following your visit.   [place] account gives you access to today's visit and all your visits, tests, and labs performed at [facility] " click here if you don't have a [facility] account or go to [email or web address]  Consent: (Patient) Hollie Salk Sally provided verbal consent for this virtual visit at the beginning of the encounter.  Current Medications:  Current Outpatient Medications:    dicyclomine (BENTYL) 20 MG tablet, Take 1 tablet (20 mg total) by mouth in the morning and at bedtime., Disp: 20 tablet, Rfl: 0   ondansetron (ZOFRAN-ODT) 4 MG disintegrating tablet, Take 1 tablet (4 mg total) by mouth every 8 (eight) hours as needed., Disp: 20 tablet, Rfl: 0   ABILIFY MAINTENA 400 MG PRSY prefilled syringe, Inject into the muscle., Disp: , Rfl:    albuterol (VENTOLIN HFA) 108 (90 Base) MCG/ACT inhaler, INHALE 1 PUFF INTO THE LUNGS EVERY 6 HOURS AS NEEDED FOR WHEEZING OR SHORTNESS OF BREATH., Disp: 8.5 each, Rfl: 5   busPIRone (BUSPAR) 10 MG tablet, TAKE 1 TABLET BY MOUTH 2 TO 3 TIMES DIALY AS NEEDED FOR ANXIETY, Disp: , Rfl:    clonazePAM (KLONOPIN) 1 MG tablet, Take 1 tablet by mouth 2 (two) times daily., Disp: , Rfl:    hydrOXYzine (ATARAX/VISTARIL) 50 MG tablet, Take 100 mg by mouth daily. , Disp: , Rfl:    selenium sulfide (SELSUN) 2.5 % lotion, Apply 1 Application topically daily as needed for irritation., Disp: 118 mL, Rfl: 1   Medications ordered in this encounter:  Meds ordered this encounter  Medications   ondansetron (ZOFRAN-ODT) 4 MG disintegrating tablet    Sig: Take 1 tablet (4 mg total) by mouth every 8 (eight) hours as needed.    Dispense:  20 tablet     Refill:  0    Order Specific Question:   Supervising Provider    Answer:   Chase Picket [id number]   dicyclomine (BENTYL) 20 MG tablet    Sig: Take 1 tablet (20 mg total) by mouth in the morning and at bedtime.    Dispense:  20 tablet    Refill:  0    Order Specific Question:   Supervising Provider    Answer:   Chase Picket [id number]     *If you need refills on other medications prior to your next appointment, please contact your pharmacy*  Follow-Up: Call back or seek an in-person evaluation if the symptoms worsen or if the condition fails to improve as anticipated.  [place] [phone]  Other Instructions  Viral Gastroenteritis, Adult  Viral gastroenteritis is also known as the stomach flu. This condition may affect your stomach, small intestine, and large intestine. It can cause sudden watery diarrhea, fever, and vomiting. This condition is caused by many different viruses. These viruses can be passed from person to person very easily (are contagious). Diarrhea and vomiting can make you feel weak and cause you to become dehydrated. You may not be able to keep fluids down. Dehydration can make you tired and thirsty, cause you to have a dry mouth, and decrease how often you urinate. It is important to replace the fluids that you lose from diarrhea and vomiting. What are the  causes? Gastroenteritis is caused by many viruses, including rotavirus and norovirus. Norovirus is the most common cause in adults. You can get sick after being exposed to the viruses from other people. You can also get sick by: Eating food, drinking water, or touching a surface contaminated with one of these viruses. Sharing utensils or other personal items with an infected person. What increases the risk? You are more likely to develop this condition if you: Have a weak body defense system (immune system). Live with one or more children who are younger than 2 years. Live in a nursing  home. Travel on cruise ships. What are the signs or symptoms? Symptoms of this condition start suddenly 1-3 days after exposure to a virus. Symptoms may last for a few days or for as long as a week. Common symptoms include watery diarrhea and vomiting. Other symptoms include: Fever. Headache. Fatigue. Pain in the abdomen. Chills. Weakness. Nausea. Muscle aches. Loss of appetite. How is this diagnosed? This condition is diagnosed with a medical history and physical exam. You may also have a stool test to check for viruses or other infections. How is this treated? This condition typically goes away on its own. The focus of treatment is to prevent dehydration and restore lost fluids (rehydration). This condition may be treated with: An oral rehydration solution (ORS) to replace important salts and minerals (electrolytes) in your body. Take this if told by your health care provider. This is a drink that is sold at pharmacies and retail stores. Medicines to help with your symptoms. Probiotic supplements to reduce symptoms of diarrhea. Fluids given through an IV, if dehydration is severe. Older adults and people with other diseases or a weak immune system are at higher risk for dehydration. Follow these instructions at home: Eating and drinking  Take an ORS as told by your health care provider. Drink clear fluids in small amounts as you are able. Clear fluids include: Water. Ice chips. Diluted fruit juice. Low-calorie sports drinks. Drink enough fluid to keep your urine pale yellow. Eat small amounts of healthy foods every 3-4 hours as you are able. This may include whole grains, fruits, vegetables, lean meats, and yogurt. Avoid fluids that contain a lot of sugar or caffeine, such as energy drinks, sports drinks, and soda. Avoid spicy or fatty foods. Avoid alcohol. General instructions  Wash your hands often, especially after having diarrhea or vomiting. If soap and water are not  available, use hand sanitizer. Make sure that all people in your household wash their hands well and often. Take over-the-counter and prescription medicines only as told by your health care provider. Rest at home while you recover. Watch your condition for any changes. Take a warm bath to relieve any burning or pain from frequent diarrhea episodes. Keep all follow-up visits. This is important. Contact a health care provider if you: Cannot keep fluids down. Have symptoms that get worse. Have new symptoms. Feel light-headed or dizzy. Have muscle cramps. Get help right away if you: Have chest pain. Have trouble breathing or you are breathing very quickly. Have a fast heartbeat. Feel extremely weak or you faint. Have a severe headache, a stiff neck, or both. Have a rash. Have severe pain, cramping, or bloating in your abdomen. Have skin that feels cold and clammy. Feel confused. Have pain when you urinate. Have signs of dehydration, such as: Dark urine, very little urine, or no urine. Cracked lips. Dry mouth. Sunken eyes. Sleepiness. Weakness. Have signs of bleeding, such as:  Seeing blood in your vomit. Having vomit that looks like coffee grounds. Having bloody or black stools or stools that look like tar. These symptoms may be an emergency. Get help right away. Call 911. Do not wait to see if the symptoms will go away. Do not drive yourself to the hospital. Summary Viral gastroenteritis is also known as the stomach flu. It can cause sudden watery diarrhea, fever, and vomiting. This condition can be passed from person to person very easily (is contagious). Take an oral rehydration solution (ORS) if told by your health care provider. This is a drink that is sold at pharmacies and retail stores. Wash your hands often, especially after having diarrhea or vomiting. If soap and water are not available, use hand sanitizer. This information is not intended to replace advice given to  you by your health care provider. Make sure you discuss any questions you have with your health care provider. Document Revised: 04/06/2021 Document Reviewed: 04/06/2021 Elsevier Patient Education  [place].    If you have been instructed to have an in-person evaluation today at a local Urgent Care facility, please use the link below. It will take you to a list of all of our available [facility], including address, phone number and hours of operation. Please do not delay care.  [facility]  If you or a family member do not have a primary care provider, use the link below to schedule a visit and establish care. When you choose a [facility] primary care physician or advanced practice provider, you gain a long-term partner in health. Find a Primary Care Provider  Learn more about [facility] in-office and virtual care options: [facility]

## 2022-08-25 ENCOUNTER — Telehealth: Payer: Self-pay | Admitting: Internal Medicine

## 2022-08-25 NOTE — Telephone Encounter (Signed)
.  aw 

## 2022-09-05 ENCOUNTER — Other Ambulatory Visit: Payer: Self-pay | Admitting: Obstetrics and Gynecology

## 2022-09-05 DIAGNOSIS — Z30011 Encounter for initial prescription of contraceptive pills: Secondary | ICD-10-CM

## 2022-09-10 ENCOUNTER — Ambulatory Visit (INDEPENDENT_AMBULATORY_CARE_PROVIDER_SITE_OTHER): Payer: 59

## 2022-09-10 VITALS — Wt 182.0 lb

## 2022-09-10 DIAGNOSIS — Z0000 Encounter for general adult medical examination without abnormal findings: Secondary | ICD-10-CM | POA: Diagnosis not present

## 2022-09-10 NOTE — Progress Notes (Signed)
I connected with  Ellen Kaiser on 09/10/22 by a audio enabled telemedicine application and verified that I am speaking with the correct person using two identifiers.  Patient Location: Home  Provider Location: Office/Clinic  I discussed the limitations of evaluation and management by telemedicine. The patient expressed understanding and agreed to proceed.  Subjective:   Ellen Kaiser is a 32 y.o. female who presents for Medicare Annual (Subsequent) preventive examination.  Review of Systems     Cardiac Risk Factors include: none     Objective:    There were no vitals filed for this visit. There is no height or weight on file to calculate BMI.     09/10/2022    9:24 AM 10/27/2020    8:43 AM 10/27/2020    1:26 AM 10/02/2018    2:05 PM  Advanced Directives  Does Patient Have a Medical Advance Directive? No No No No  Would patient like information on creating a medical advance directive? No - Patient declined No - Patient declined      Current Medications (verified) Outpatient Encounter Medications as of 09/10/2022  Medication Sig   ABILIFY MAINTENA 400 MG PRSY prefilled syringe Inject into the muscle.   albuterol (VENTOLIN HFA) 108 (90 Base) MCG/ACT inhaler INHALE 1 PUFF INTO THE LUNGS EVERY 6 HOURS AS NEEDED FOR WHEEZING OR SHORTNESS OF BREATH.   busPIRone (BUSPAR) 10 MG tablet TAKE 1 TABLET BY MOUTH 2 TO 3 TIMES DIALY AS NEEDED FOR ANXIETY   clonazePAM (KLONOPIN) 1 MG tablet Take 1 tablet by mouth 2 (two) times daily.   hydrOXYzine (ATARAX/VISTARIL) 50 MG tablet Take 100 mg by mouth daily.    selenium sulfide (SELSUN) 2.5 % lotion Apply 1 Application topically daily as needed for irritation.   dicyclomine (BENTYL) 20 MG tablet Take 1 tablet (20 mg total) by mouth in the morning and at bedtime. (Patient not taking: Reported on 09/10/2022)   ondansetron (ZOFRAN-ODT) 4 MG disintegrating tablet Take 1 tablet (4 mg total) by mouth every 8 (eight) hours as needed.  (Patient not taking: Reported on 09/10/2022)   No facility-administered encounter medications on file as of 09/10/2022.    Allergies (verified) Fish allergy   History: Past Medical History:  Diagnosis Date   Allergy    Anxiety    Asthma    Bipolar 1 disorder (HCC)    BV (bacterial vaginosis)    Depression    HSV-2 seropositive    Past Surgical History:  Procedure Laterality Date   WISDOM TOOTH EXTRACTION     Family History  Problem Relation Age of Onset   Depression Mother    Alcohol abuse Mother    Bipolar disorder Mother    Healthy Father    Healthy Sister    Osteoarthritis Maternal Grandmother    Dementia Paternal Grandmother    Social History   Socioeconomic History   Marital status: Single    Spouse name: Not on file   Number of children: Not on file   Years of education: college   Highest education level: Not on file  Occupational History   Occupation: disabled    Comment: PTSD  Tobacco Use   Smoking status: Every Day    Packs/day: 0.50    Years: 10.00    Additional pack years: 0.00    Total pack years: 5.00    Types: Cigarettes   Smokeless tobacco: Never  Vaping Use   Vaping Use: Former   Substances: THC  Substance and Sexual Activity  Alcohol use: Yes    Alcohol/week: 3.0 standard drinks of alcohol    Types: 3 Cans of beer per week    Comment: 3 beers weekly   Drug use: Yes    Types: Marijuana   Sexual activity: Yes    Partners: Male    Birth control/protection: Pill    Comment: Mirena  Other Topics Concern   Not on file  Social History Narrative   Not on file   Social Determinants of Health   Financial Resource Strain: Low Risk  (09/10/2022)   Overall Financial Resource Strain (CARDIA)    Difficulty of Paying Living Expenses: Not very hard  Food Insecurity: No Food Insecurity (09/10/2022)   Hunger Vital Sign    Worried About Running Out of Food in the Last Year: Never true    Ran Out of Food in the Last Year: Never true   Transportation Needs: No Transportation Needs (09/10/2022)   PRAPARE - Hydrologist (Medical): No    Lack of Transportation (Non-Medical): No  Physical Activity: Insufficiently Active (09/10/2022)   Exercise Vital Sign    Days of Exercise per Week: 2 days    Minutes of Exercise per Session: 30 min  Stress: No Stress Concern Present (09/10/2022)   [place]    Feeling of Stress : Only a little  Social Connections: Socially Isolated (09/10/2022)   Social Connection and Isolation Panel [NHANES]    Frequency of Communication with Friends and Family: More than three times a week    Frequency of Social Gatherings with Friends and Family: Once a week    Attends Religious Services: Never    Marine scientist or Organizations: No    Attends Music therapist: Never    Marital Status: Never married    Tobacco Counseling Ready to quit: Not Answered Counseling given: Not Answered   Clinical Intake:  Pre-visit preparation completed: Yes  Pain : No/denies pain     Nutritional Risks: None Diabetes: No  How often do you need to have someone help you when you read instructions, pamphlets, or other written materials from your doctor or pharmacy?: 1 - Never  Diabetic?no  Interpreter Needed?: No  Information entered by :: Kirke Shaggy, LPN   Activities of Daily Living    09/10/2022    9:24 AM 07/16/2022    3:28 PM  In your present state of health, do you have any difficulty performing the following activities:  Hearing? 0 0  Vision? 0 0  Difficulty concentrating or making decisions? 0 0  Walking or climbing stairs? 0 0  Dressing or bathing? 0 0  Doing errands, shopping? 0 0  Preparing Food and eating ? N   Using the Toilet? N   In the past six months, have you accidently leaked urine? N   Do you have problems with loss of bowel control? N   Managing your Medications?  N   Managing your Finances? N   Housekeeping or managing your Housekeeping? N     Patient Care Team: [name], NP as PCP - General (Internal Medicine) Laurence Spates, NP as Nurse Practitioner (Psychiatry) Ree Edman, MD (Dermatology)  Indicate any recent Medical Services you may have received from other than [facility] providers in the past year (date may be approximate).     Assessment:   This is a routine wellness examination for Ellen Kaiser.  Hearing/Vision screen Hearing Screening - Comments::  No aids Vision Screening - Comments:: Wears glasses- [facility]  Dietary issues and exercise activities discussed: Current Exercise Habits: Home exercise routine, Type of exercise: walking, Time (Minutes): 30, Frequency (Times/Week): 2, Weekly Exercise (Minutes/Week): 60, Intensity: Mild   Goals Addressed             This Visit's Progress    DIET - EAT MORE FRUITS AND VEGETABLES         Depression Screen    09/10/2022    9:22 AM 07/16/2022    3:27 PM 04/15/2022    8:51 AM 02/25/2022    1:35 PM 01/04/2022    2:55 PM 10/14/2021    9:12 AM 09/07/2021    1:14 PM  PHQ 2/9 Scores  PHQ - 2 Score 0 1 0 1 1 0 0  PHQ- 9 Score 0  0 3 2 0 1    Fall Risk    09/10/2022    9:24 AM 07/16/2022    3:27 PM 04/15/2022    8:51 AM 02/25/2022    1:35 PM 01/04/2022    2:55 PM  Fall Risk   Falls in the past year? 0 0 0 0 0  Number falls in past yr: 0  0 0 0  Injury with Fall? 0 0 0 0 0  Risk for fall due to : No Fall Risks No Fall Risks No Fall Risks No Fall Risks No Fall Risks  Follow up Falls prevention discussed;Falls evaluation completed  Falls evaluation completed Falls evaluation completed Falls evaluation completed    FALL RISK PREVENTION PERTAINING TO THE HOME:  Any stairs in or around the home? No  If so, are there any without handrails? No  Home free of loose throw rugs in walkways, pet beds, electrical cords, etc? Yes  Adequate lighting in your home to reduce risk of  falls? Yes   ASSISTIVE DEVICES UTILIZED TO PREVENT FALLS:  Life alert? No  Use of a cane, walker or w/c? No  Grab bars in the bathroom? No  Shower chair or bench in shower? No  Elevated toilet seat or a handicapped toilet? No    Cognitive Function:pt declined test 2024        09/07/2021    1:16 PM  6CIT Screen  What Year? 0 points  What month? 0 points  What time? 0 points  Count back from 20 0 points  Months in reverse 0 points  Repeat phrase 0 points  Total Score 0 points    Immunizations Immunization History  Administered Date(s) Administered   HPV 9-valent 08/15/2015, 12/16/2015   HPV Quadrivalent 04/19/2014   Influenza,inj,Quad PF,6+ Mos 04/13/2021, 02/25/2022   Influenza-Unspecified 02/21/2019, 06/05/2020   PFIZER(Purple Top)SARS-COV-2 Vaccination 12/04/2019, 12/27/2019, 07/09/2020   PPD Test 07/30/2015, 08/18/2016, 11/15/2017   Pneumococcal Polysaccharide-23 03/21/2015, 04/13/2021   Tdap 08/15/2015, 07/09/2020    TDAP status: Up to date  Flu Vaccine status: Up to date  Pneumococcal vaccine status: Up to date  Covid-19 vaccine status: Completed vaccines  Qualifies for Shingles Vaccine? No   Zostavax completed No   Shingrix Completed?: No.    Education has been provided regarding the importance of this vaccine. Patient has been advised to call insurance company to determine out of pocket expense if they have not yet received this vaccine. Advised may also receive vaccine at local pharmacy or Health Dept. Verbalized acceptance and understanding.  Screening Tests Health Maintenance  Topic Date Due   COVID-19 Vaccine (4 - 2023-24 season) 02/19/2022   Medicare Annual  Wellness (AWV)  09/10/2023   DTaP/Tdap/Td (3 - Td or Tdap) 07/09/2030   INFLUENZA VACCINE  Completed   HPV VACCINES  Completed   Hepatitis C Screening  Completed   HIV Screening  Completed   PAP SMEAR-Modifier  Discontinued    Health Maintenance  Health Maintenance Due  Topic Date Due    COVID-19 Vaccine (4 - 2023-24 season) 02/19/2022     Lung Cancer Screening: (Low Dose CT Chest recommended if Age 71-80 years, 30 pack-year currently smoking OR have quit w/in 15years.) does not qualify.   Additional Screening:  Hepatitis C Screening: does qualify; Completed 04/15/22  Vision Screening: Recommended annual ophthalmology exams for early detection of glaucoma and other disorders of the eye. Is the patient up to date with their annual eye exam?  Yes  Who is the provider or what is the name of the office in which the patient attends annual eye exams? [facility] If pt is not established with a provider, would they like to be referred to a provider to establish care? No .   Dental Screening: Recommended annual dental exams for proper oral hygiene  Community Resource Referral / Chronic Care Management: CRR required this visit?  No   CCM required this visit?  No      Plan:     I have personally reviewed and noted the following in the patient's chart:   Medical and social history Use of alcohol, tobacco or illicit drugs  Current medications and supplements including opioid prescriptions. Patient is not currently taking opioid prescriptions. Functional ability and status Nutritional status Physical activity Advanced directives List of other physicians Hospitalizations, surgeries, and ER visits in previous 12 months Vitals Screenings to include cognitive, depression, and falls Referrals and appointments  In addition, I have reviewed and discussed with patient certain preventive protocols, quality metrics, and best practice recommendations. A written personalized care plan for preventive services as well as general preventive health recommendations were provided to patient.     Dionisio David, LPN   [id number]   Nurse Notes: none

## 2022-09-10 NOTE — Patient Instructions (Signed)
[name] , Thank you for taking time to come for your Medicare Wellness Visit. I appreciate your ongoing commitment to your health goals. Please review the following plan we discussed and let me know if I can assist you in the future.   These are the goals we discussed:  Goals      DIET - EAT MORE FRUITS AND VEGETABLES     Exercise 150 min/wk Moderate Activity     Cece would also like to take a trip this year out of the country.         This is a list of the screening recommended for you and due dates:  Health Maintenance  Topic Date Due   COVID-19 Vaccine (4 - 2023-24 season) 02/19/2022   Medicare Annual Wellness Visit  09/10/2023   DTaP/Tdap/Td vaccine (3 - Td or Tdap) 07/09/2030   Flu Shot  Completed   HPV Vaccine  Completed   Hepatitis C Screening: USPSTF Recommendation to screen - Ages 18-79 yo.  Completed   HIV Screening  Completed   Pap Smear  Discontinued    Advanced directives: no  Conditions/risks identified: none  Next appointment: Follow up in one year for your annual wellness visit. 09/15/23 @ 3:30 pm by phone  Preventive Care 11-66 Years Old, Female Preventive care refers to lifestyle choices and visits with your health care provider that can promote health and wellness. Preventive care visits are also called wellness exams. What can I expect for my preventive care visit? Counseling During your preventive care visit, your health care provider may ask about your: Medical history, including: Past medical problems. Family medical history. Pregnancy history. Current health, including: Menstrual cycle. Method of birth control. Emotional well-being. Home life and relationship well-being. Sexual activity and sexual health. Lifestyle, including: Alcohol, nicotine or tobacco, and drug use. Access to firearms. Diet, exercise, and sleep habits. Work and work Statistician. Sunscreen use. Safety issues such as seatbelt and bike helmet use. Physical exam Your  health care provider may check your: Height and weight. These may be used to calculate your BMI (body mass index). BMI is a measurement that tells if you are at a healthy weight. Waist circumference. This measures the distance around your waistline. This measurement also tells if you are at a healthy weight and may help predict your risk of certain diseases, such as type 2 diabetes and high blood pressure. Heart rate and blood pressure. Body temperature. Skin for abnormal spots. What immunizations do I need? Vaccines are usually given at various ages, according to a schedule. Your health care provider will recommend vaccines for you based on your age, medical history, and lifestyle or other factors, such as travel or where you work. What tests do I need? Screening Your health care provider may recommend screening tests for certain conditions. This may include: Pelvic exam and Pap test. Lipid and cholesterol levels. Diabetes screening. This is done by checking your blood sugar (glucose) after you have not eaten for a while (fasting). Hepatitis B test. Hepatitis C test. HIV (human immunodeficiency virus) test. STI (sexually transmitted infection) testing, if you are at risk. BRCA-related cancer screening. This may be done if you have a family history of breast, ovarian, tubal, or peritoneal cancers. Talk with your health care provider about your test results, treatment options, and if necessary, the need for more tests. Follow these instructions at home: Eating and drinking  Eat a healthy diet that includes fresh fruits and vegetables, whole grains, lean protein, and low-fat  dairy products. Take vitamin and mineral supplements as recommended by your health care provider. Do not drink alcohol if: Your health care provider tells you not to drink. You are pregnant, may be pregnant, or are planning to become pregnant. If you drink alcohol: Limit how much you have to 0-1 drink a day. Know how  much alcohol is in your drink. In the [place] one drink equals one 12 oz bottle of beer (355 mL), one 5 oz glass of wine (148 mL), or one 1 oz glass of hard liquor (44 mL). Lifestyle Brush your teeth every morning and night with fluoride toothpaste. Floss one time each day. Exercise for at least 30 minutes 5 or more days each week. Do not use any products that contain nicotine or tobacco. These products include cigarettes, chewing tobacco, and vaping devices, such as e-cigarettes. If you need help quitting, ask your health care provider. Do not use drugs. If you are sexually active, practice safe sex. Use a condom or other form of protection to prevent STIs. If you do not wish to become pregnant, use a form of birth control. If you plan to become pregnant, see your health care provider for a prepregnancy visit. Find healthy ways to manage stress, such as: Meditation, yoga, or listening to music. Journaling. Talking to a trusted person. Spending time with friends and family. Minimize exposure to UV radiation to reduce your risk of skin cancer. Safety Always wear your seat belt while driving or riding in a vehicle. Do not drive: If you have been drinking alcohol. Do not ride with someone who has been drinking. If you have been using any mind-altering substances or drugs. While texting. When you are tired or distracted. Wear a helmet and other protective equipment during sports activities. If you have firearms in your house, make sure you follow all gun safety procedures. Seek help if you have been physically or sexually abused. What's next? Go to your health care provider once a year for an annual wellness visit. Ask your health care provider how often you should have your eyes and teeth checked. Stay up to date on all vaccines. This information is not intended to replace advice given to you by your health care provider. Make sure you discuss any questions you have with your health care  provider. Document Revised: 12/03/2020 Document Reviewed: 12/03/2020 Elsevier Patient Education  [employer].

## 2022-09-20 ENCOUNTER — Ambulatory Visit
Admission: RE | Admit: 2022-09-20 | Discharge: 2022-09-20 | Disposition: A | Discharge status: Home / Self Care | Payer: 59 | Source: Ambulatory Visit | Attending: Emergency Medicine | Admitting: Emergency Medicine

## 2022-09-20 VITALS — BP 116/82 | HR 82 | Temp 97.9°F | Resp 18 | Ht 60.0 in | Wt 182.0 lb

## 2022-09-20 DIAGNOSIS — Z3202 Encounter for pregnancy test, result negative: Secondary | ICD-10-CM | POA: Insufficient documentation

## 2022-09-20 DIAGNOSIS — Z113 Encounter for screening for infections with a predominantly sexual mode of transmission: Secondary | ICD-10-CM | POA: Diagnosis not present

## 2022-09-20 LAB — POCT URINALYSIS DIP (MANUAL ENTRY)
Bilirubin, UA: NEGATIVE
Blood, UA: NEGATIVE
Glucose, UA: NEGATIVE mg/dL
Ketones, POC UA: NEGATIVE mg/dL
Leukocytes, UA: NEGATIVE
Nitrite, UA: NEGATIVE
Protein Ur, POC: NEGATIVE mg/dL
Spec Grav, UA: 1.025 (ref 1.010–1.025)
Urobilinogen, UA: 0.2 E.U./dL
pH, UA: 5.5 (ref 5.0–8.0)

## 2022-09-20 LAB — POCT URINE PREGNANCY: Preg Test, Ur: NEGATIVE

## 2022-09-20 NOTE — Discharge Instructions (Signed)
Your tests are pending.  If your test results are positive, we will call you.  You and your sexual partner(s) may require treatment at that time.  Do not have sexual activity for at least 7 days.     

## 2022-09-20 NOTE — ED Provider Notes (Signed)
Roderic Palau    CSN: [id number] Arrival date & time: 09/20/22  [id number]      History   Chief Complaint Chief Complaint  Patient presents with   SEXUALLY TRANSMITTED DISEASE    Entered by patient    HPI Ellen Kaiser is a 32 y.o. female.  Patient presents for routine STD screening.  She denies symptoms.  She denies vaginal discharge, pelvic pain, abdominal pain, dysuria, rash, or other symptoms.    The history is provided by the patient and medical records.    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 Authorizing Provider  ABILIFY MAINTENA 400 MG PRSY prefilled syringe Inject into the muscle. 08/04/21   [provider]  albuterol (VENTOLIN HFA) 108 (90 Base) MCG/ACT inhaler INHALE 1 PUFF INTO THE LUNGS EVERY 6 HOURS AS NEEDED FOR WHEEZING OR SHORTNESS OF BREATH. 12/01/21   [name], NP  busPIRone (BUSPAR) 10 MG tablet TAKE 1 TABLET BY MOUTH 2 TO 3 TIMES DIALY AS NEEDED  FOR ANXIETY 08/27/20   [provider]  clonazePAM (KLONOPIN) 1 MG tablet Take 1 tablet by mouth 2 (two) times daily. 02/28/19   [provider]  dicyclomine (BENTYL) 20 MG tablet Take 1 tablet (20 mg total) by mouth in the morning and at bedtime. Patient not taking: Reported on 09/10/2022 08/17/22   Mar Daring, PA-C  hydrOXYzine (ATARAX/VISTARIL) 50 MG tablet Take 100 mg by mouth daily.  03/30/19   [provider]  ondansetron (ZOFRAN-ODT) 4 MG disintegrating tablet Take 1 tablet (4 mg total) by mouth every 8 (eight) hours as needed. Patient not taking: Reported on 09/10/2022 08/17/22   Mar Daring, PA-C  selenium sulfide (SELSUN) 2.5 % lotion Apply 1 Application topically daily as needed for irritation. 07/30/22   [name], NP    Family History Family History  Problem Relation Age of Onset   Depression Mother    Alcohol abuse Mother    Bipolar disorder Mother    Healthy Father    Healthy Sister    Osteoarthritis Maternal Grandmother    Dementia Paternal Grandmother     Social History Social History  Tobacco Use   Smoking status: Every Day    Packs/day: 0.50    Years: 10.00    Additional pack years: 0.00    Total pack years: 5.00    Types: Cigarettes   Smokeless tobacco: Never  Vaping Use   Vaping Use: Former   Substances: THC  Substance Use Topics   Alcohol use: Yes    Alcohol/week: 3.0 standard drinks of alcohol    Types: 3 Cans of beer per week    Comment: 3 beers weekly   Drug use: Yes    Types: Marijuana     Allergies   Fish allergy   Review of Systems Review of Systems  Constitutional:  Negative for chills and fever.  Gastrointestinal:  Negative for abdominal pain, diarrhea, nausea and vomiting.  Genitourinary:  Negative for dysuria, flank pain, frequency, hematuria, pelvic pain and vaginal discharge.  Skin:  Negative for rash.  All other systems reviewed and are negative.    Physical Exam Triage Vital  Signs ED Triage Vitals  Enc Vitals Group     BP --      Pulse Rate 09/20/22 0958 82     Resp 09/20/22 0958 18     Temp 09/20/22 0958 97.9 F (36.6 C)     Temp src --      SpO2 09/20/22 0958 98 %     Weight 09/20/22 1000 182 lb (82.6 kg)     Height 09/20/22 1000 5' (1.524 m)     Head Circumference --      Peak Flow --      Pain Score 09/20/22 1000 0     Pain Loc --      Pain Edu? --      Excl. in [place]? --    No data found.  Updated Vital Signs BP 116/82   Pulse 82   Temp 97.9 F (36.6 C)   Resp 18   Ht 5' (1.524 m)   Wt 182 lb (82.6 kg)   LMP 09/16/2022   SpO2 98%   BMI 35.54 kg/m   Visual Acuity Right Eye Distance:   Left Eye Distance:   Bilateral Distance:    Right Eye Near:   Left Eye Near:    Bilateral Near:     Physical Exam Vitals and nursing note reviewed.  Constitutional:      General: She is not in acute distress.    Appearance: Normal appearance. She is well-developed. She is not ill-appearing.  HENT:     Mouth/Throat:     Mouth: Mucous membranes are moist.  Cardiovascular:     Rate and Rhythm: Normal rate and regular rhythm.     Heart sounds: Normal heart sounds.  Pulmonary:     Effort: Pulmonary effort is normal. No respiratory distress.     Breath sounds: Normal breath sounds.  Abdominal:     General: Bowel sounds are normal.     Palpations: Abdomen is soft.     Tenderness: There is no abdominal tenderness. There is no right CVA tenderness, left CVA tenderness, guarding or rebound.  Musculoskeletal:     Cervical back: Neck supple.  Skin:    General: Skin is warm and dry.  Neurological:     Mental Status: She is alert.  Psychiatric:        Mood and Affect: Mood normal.        Behavior: Behavior normal.      UC Treatments / Results  Labs (all labs ordered are listed,  but only abnormal results are displayed) Labs Reviewed  RPR  HIV ANTIBODY (ROUTINE TESTING W REFLEX)  POCT URINALYSIS DIP (MANUAL ENTRY)  POCT URINE PREGNANCY   CERVICOVAGINAL ANCILLARY ONLY    EKG   Radiology No results found.  Procedures Procedures (including critical care time)  Medications Ordered in UC Medications - No data to display  Initial Impression / Assessment and Plan / UC Course  I have reviewed the triage vital signs and the nursing notes.  Pertinent labs & imaging results that were available during my care of the patient were reviewed by me and considered in my medical decision making (see chart for details).    STD screening.  Urine normal; urine pregnancy negative. Patient obtained vaginal self swab for testing.  HIV and syphilis pending.  Discussed that we will call if test results are positive.  Discussed that she may require treatment at that time.  Discussed that sexual partner(s) may also require treatment.  Instructed patient to abstain from sexual activity for at least 7 days.  Instructed her to follow-up with her PCP or gynecologist.  Patient agrees to plan of care.   Final Clinical Impressions(s) / UC Diagnoses   Final diagnoses:  Screening for STD (sexually transmitted disease)  Negative pregnancy test     Discharge Instructions      Your tests are pending.  If your test results are positive, we will call you.  You and your sexual partner(s) may require treatment at that time.  Do not have sexual activity for at least 7 days.         ED Prescriptions   None    PDMP not reviewed this encounter.   Sharion Balloon, NP 09/20/22 1034

## 2022-09-20 NOTE — ED Triage Notes (Signed)
Patient to Urgent Care for STD testing. Denies any symptoms. Denies any known exposures.   Also requests blood work.

## 2022-09-21 ENCOUNTER — Telehealth (HOSPITAL_COMMUNITY): Payer: Self-pay

## 2022-09-21 LAB — RPR: RPR Ser Ql: NONREACTIVE

## 2022-09-21 LAB — CERVICOVAGINAL ANCILLARY ONLY
Bacterial Vaginitis (gardnerella): NEGATIVE
Candida Glabrata: NEGATIVE
Candida Vaginitis: POSITIVE — AB
Chlamydia: NEGATIVE
Comment: NEGATIVE
Comment: NEGATIVE
Comment: NEGATIVE
Comment: NEGATIVE
Comment: NEGATIVE
Comment: NORMAL
Neisseria Gonorrhea: NEGATIVE
Trichomonas: NEGATIVE

## 2022-09-21 LAB — HIV ANTIBODY (ROUTINE TESTING W REFLEX): HIV Screen 4th Generation wRfx: NONREACTIVE

## 2022-09-21 MED ORDER — FLUCONAZOLE 150 MG PO TABS: 150.0000 mg | ORAL_TABLET | Freq: Every day | ORAL | 0 refills | Status: AC

## 2022-10-25 ENCOUNTER — Ambulatory Visit (INDEPENDENT_AMBULATORY_CARE_PROVIDER_SITE_OTHER): Payer: 59 | Admitting: Internal Medicine

## 2022-10-25 ENCOUNTER — Encounter: Payer: Self-pay | Admitting: Internal Medicine

## 2022-10-25 VITALS — BP 108/74 | HR 94 | Temp 97.1°F | Ht 60.0 in | Wt 185.0 lb

## 2022-10-25 DIAGNOSIS — Z0001 Encounter for general adult medical examination with abnormal findings: Secondary | ICD-10-CM

## 2022-10-25 DIAGNOSIS — R7309 Other abnormal glucose: Secondary | ICD-10-CM

## 2022-10-25 DIAGNOSIS — Z6836 Body mass index (BMI) 36.0-36.9, adult: Secondary | ICD-10-CM | POA: Diagnosis not present

## 2022-10-25 MED ORDER — NAPROXEN 500 MG PO TABS: 500.0000 mg | ORAL_TABLET | Freq: Every day | ORAL | 0 refills | Status: DC | PRN

## 2022-10-25 MED ORDER — ALBUTEROL SULFATE HFA 108 (90 BASE) MCG/ACT IN AERS: INHALATION_SPRAY | RESPIRATORY_TRACT | 1 refills | Status: DC

## 2022-10-25 NOTE — Progress Notes (Signed)
Subjective:    Patient ID: Ellen Kaiser, female    DOB: 10-Dec-1990, 32 y.o.   MRN: [id number]  HPI  Patient presents to clinic today for her annual exam.  Flu: 02/2022 Tetanus: 06/2020 COVID: Pfizer x 3 Pap smear: 06/2021 Dentist: biannually  Diet: She does eat meat. She consumes fruits and veggies. She does eat some fried foods. She drinks mostly water, juice or soda. Exercise: None    Review of Systems  Past Medical History:  Diagnosis Date   Allergy    Anxiety    Asthma    Bipolar 1 disorder (HCC)    BV (bacterial vaginosis)    Depression    HSV-2 seropositive     Current Outpatient Medications  Medication Sig Dispense Refill   ABILIFY MAINTENA 400 MG PRSY prefilled syringe Inject into the muscle.     albuterol (VENTOLIN HFA) 108 (90 Base) MCG/ACT inhaler INHALE 1 PUFF INTO THE LUNGS EVERY 6 HOURS AS NEEDED FOR WHEEZING OR SHORTNESS OF BREATH. 8.5 each 5   busPIRone (BUSPAR) 10 MG tablet TAKE 1 TABLET BY MOUTH 2 TO 3 TIMES DIALY AS NEEDED FOR ANXIETY     clonazePAM (KLONOPIN) 1 MG tablet Take 1 tablet by mouth 2 (two) times daily.     dicyclomine (BENTYL) 20 MG tablet Take 1 tablet (20 mg total) by mouth in the morning and at bedtime. (Patient not taking: Reported on 09/10/2022) 20 tablet 0   hydrOXYzine (ATARAX/VISTARIL) 50 MG tablet Take 100 mg by mouth daily.      ondansetron (ZOFRAN-ODT) 4 MG disintegrating tablet Take 1 tablet (4 mg total) by mouth every 8 (eight) hours as needed. (Patient not taking: Reported on 09/10/2022) 20 tablet 0   selenium sulfide (SELSUN) 2.5 % lotion Apply 1 Application topically daily as needed for irritation. 118 mL 1   No current facility-administered medications for this visit.    Allergies  Allergen Reactions   Fish Allergy Anaphylaxis and Swelling    Family History  Problem Relation Age of Onset   Depression Mother    Alcohol abuse Mother    Bipolar disorder Mother    Healthy Father    Healthy Sister     Osteoarthritis Maternal Grandmother    Dementia Paternal Grandmother     Social History   Socioeconomic History   Marital status: Single    Spouse name: Not on file   Number of children: Not on file   Years of education: college   Highest education level: Not on file  Occupational History   Occupation: disabled    Comment: PTSD  Tobacco Use   Smoking status: Every Day    Packs/day: 0.50    Years: 10.00    Additional pack years: 0.00    Total pack years: 5.00    Types: Cigarettes   Smokeless tobacco: Never  Vaping Use   Vaping Use: Former   Substances: THC  Substance and Sexual Activity   Alcohol use: Yes    Alcohol/week: 3.0 standard drinks of alcohol    Types: 3 Cans of beer per week    Comment: 3 beers weekly   Drug use: Yes    Types: Marijuana   Sexual activity: Yes    Partners: Male    Birth control/protection: Pill    Comment: Mirena  Other Topics Concern   Not on file  Social History Narrative   Not on file   Social Determinants of Health   Financial Resource Strain: Low Risk  (09/10/2022)  Overall Financial Resource Strain (CARDIA)    Difficulty of Paying Living Expenses: Not very hard  Food Insecurity: No Food Insecurity (09/10/2022)   Hunger Vital Sign    Worried About Running Out of Food in the Last Year: Never true    Ran Out of Food in the Last Year: Never true  Transportation Needs: No Transportation Needs (09/10/2022)   PRAPARE - Administrator, Civil Service (Medical): No    Lack of Transportation (Non-Medical): No  Physical Activity: Insufficiently Active (09/10/2022)   Exercise Vital Sign    Days of Exercise per Week: 2 days    Minutes of Exercise per Session: 30 min  Stress: No Stress Concern Present (09/10/2022)   [employer] of Occupational Health - Occupational Stress Questionnaire    Feeling of Stress : Only a little  Social Connections: Socially Isolated (09/10/2022)   Social Connection and Isolation Panel [NHANES]     Frequency of Communication with Friends and Family: More than three times a week    Frequency of Social Gatherings with Friends and Family: Once a week    Attends Religious Services: Never    Database administrator or Organizations: No    Attends Banker Meetings: Never    Marital Status: Never married  Intimate Partner Violence: Not At Risk (09/10/2022)   Humiliation, Afraid, Rape, and Kick questionnaire    Fear of Current or Ex-Partner: No    Emotionally Abused: No    Physically Abused: No    Sexually Abused: No     Constitutional: Patient reports intermittent headaches.  Denies fever, malaise, fatigue, or abrupt weight changes.  HEENT: Denies eye pain, eye redness, ear pain, ringing in the ears, wax buildup, runny nose, nasal congestion, bloody nose, or sore throat. Respiratory: Denies difficulty breathing, shortness of breath, cough or sputum production.   Cardiovascular: Denies chest pain, chest tightness, palpitations or swelling in the hands or feet.  Gastrointestinal: Denies abdominal pain, bloating, constipation, diarrhea or blood in the stool.  GU: Denies urgency, frequency, pain with urination, burning sensation, blood in urine, odor or discharge. Musculoskeletal: Denies decrease in range of motion, difficulty with gait, muscle pain or joint pain and swelling.  Skin: Patient reports acne.  Denies redness, rashes, lesions or ulcercations.  Neurological: Denies dizziness, difficulty with memory, difficulty with speech or problems with balance and coordination.  Psych: Patient has a history of anxiety and depression.  Denies SI/HI.  No other specific complaints in a complete review of systems (except as listed in HPI above).     Objective:   Physical Exam  BP 108/74 (BP Location: Left Arm, Patient Position: Sitting, Cuff Size: Large)   Pulse 94   Temp (!) 97.1 F (36.2 C) (Temporal)   Ht 5' (1.524 m)   Wt 185 lb (83.9 kg)   SpO2 100%   BMI 36.13 kg/m   Wt  Readings from Last 3 Encounters:  09/20/22 182 lb (82.6 kg)  09/10/22 182 lb (82.6 kg)  07/30/22 182 lb (82.6 kg)    General: Appears her stated age, obese, in NAD. Skin: Warm, dry and intact.  Acne noted on face. HEENT: Head: normal shape and size; Eyes: sclera white, no icterus, conjunctiva pink, PERRLA and EOMs intact;  Neck:  Neck supple, trachea midline. No masses, lumps or thyromegaly present.  Cardiovascular: Normal rate and rhythm. S1,S2 noted.  No murmur, rubs or gallops noted. No JVD or BLE edema.  Pulmonary/Chest: Normal effort and positive vesicular breath  sounds. No respiratory distress. No wheezes, rales or ronchi noted.  Abdomen: Normal bowel sounds.  Musculoskeletal: Strength 5/5 BUE/BLE.  No difficulty with gait.  Neurological: Alert and oriented. Cranial nerves II-XII grossly intact. Coordination normal.  Psychiatric: Mood and affect normal. Behavior is normal. Judgment and thought content normal.    BMET    Component Value Date/Time   NA 140 10/14/2021 0901   K 3.9 10/14/2021 0901   CL 106 10/14/2021 0901   CO2 23 10/14/2021 0901   GLUCOSE 89 10/14/2021 0901   BUN 9 10/14/2021 0901   CREATININE 0.93 10/14/2021 0901   CALCIUM 9.7 10/14/2021 0901   GFRNONAA 81 05/26/2020 0947   GFRAA 94 05/26/2020 0947    Lipid Panel     Component Value Date/Time   CHOL 179 10/14/2021 0901   TRIG 93 10/14/2021 0901   HDL 85 10/14/2021 0901   CHOLHDL 2.1 10/14/2021 0901   LDLCALC 76 10/14/2021 0901    CBC    Component Value Date/Time   WBC 6.3 10/14/2021 0901   RBC 4.19 10/14/2021 0901   HGB 13.9 10/14/2021 0901   HCT 42.4 10/14/2021 0901   PLT 249 10/14/2021 0901   MCV 101.2 (H) 10/14/2021 0901   MCH 33.2 (H) 10/14/2021 0901   MCHC 32.8 10/14/2021 0901   RDW 12.5 10/14/2021 0901   LYMPHSABS 2,141 05/26/2020 0947   EOSABS 20 05/26/2020 0947   BASOSABS 29 05/26/2020 0947    Hgb A1C Lab Results  Component Value Date   HGBA1C 5.1 10/14/2021             Assessment & Plan:   Preventative Health Maintenance:  Encouraged her to get a flu shot in the fall Tetanus UTD Encouraged her to get her COVID booster Pap smear UTD Encouraged her to consume a balanced diet and exercise regimen Advised her seeing eye doctor and dentist annually We will check CBC, c-Met, lipid, A1c today  RTC in 6 months, follow-up chronic conditions Nicki Reaper, NP

## 2022-10-25 NOTE — Assessment & Plan Note (Signed)
Encourage diet and exercise for weight loss 

## 2022-10-25 NOTE — Patient Instructions (Signed)

## 2022-10-26 LAB — CBC
HCT: 40.5 % (ref 35.0–45.0)
Hemoglobin: 13.4 g/dL (ref 11.7–15.5)
MCH: 32.8 pg (ref 27.0–33.0)
MCHC: 33.1 g/dL (ref 32.0–36.0)
MCV: 99 fL (ref 80.0–100.0)
MPV: 10.7 fL (ref 7.5–12.5)
Platelets: 258 10*3/uL (ref 140–400)
RBC: 4.09 10*6/uL (ref 3.80–5.10)
RDW: 12.7 % (ref 11.0–15.0)
WBC: 5.6 10*3/uL (ref 3.8–10.8)

## 2022-10-26 LAB — HEMOGLOBIN A1C
Hgb A1c MFr Bld: 5.4 % of total Hgb (ref <5.7)
Mean Plasma Glucose: 108 mg/dL
eAG (mmol/L): 6 mmol/L

## 2022-10-26 LAB — LIPID PANEL
Cholesterol: 155 mg/dL (ref <200)
HDL: 93 mg/dL (ref >=50)
LDL Cholesterol (Calc): 44 mg/dL (calc)
Non-HDL Cholesterol (Calc): 62 mg/dL (calc) (ref <130)
Total CHOL/HDL Ratio: 1.7 (calc) (ref <5.0)
Triglycerides: 94 mg/dL (ref <150)

## 2022-10-26 LAB — COMPLETE METABOLIC PANEL WITH GFR
AG Ratio: 1.8 (calc) (ref 1.0–2.5)
ALT: 15 U/L (ref 6–29)
AST: 17 U/L (ref 10–30)
Albumin: 4.2 g/dL (ref 3.6–5.1)
Alkaline phosphatase (APISO): 60 U/L (ref 31–125)
BUN: 10 mg/dL (ref 7–25)
CO2: 24 mmol/L (ref 20–32)
Calcium: 9.1 mg/dL (ref 8.6–10.2)
Chloride: 107 mmol/L (ref 98–110)
Creat: 0.96 mg/dL (ref 0.50–0.97)
Globulin: 2.3 g/dL (calc) (ref 1.9–3.7)
Glucose, Bld: 93 mg/dL (ref 65–99)
Potassium: 4.1 mmol/L (ref 3.5–5.3)
Sodium: 138 mmol/L (ref 135–146)
Total Bilirubin: 0.8 mg/dL (ref 0.2–1.2)
Total Protein: 6.5 g/dL (ref 6.1–8.1)
eGFR: 81 mL/min/{1.73_m2} (ref >=60)

## 2022-11-10 ENCOUNTER — Encounter: Payer: Self-pay | Admitting: Internal Medicine

## 2022-11-10 ENCOUNTER — Other Ambulatory Visit (HOSPITAL_COMMUNITY)
Admission: RE | Admit: 2022-11-10 | Discharge: 2022-11-10 | Disposition: A | Discharge status: Home / Self Care | Payer: 59 | Source: Ambulatory Visit | Attending: Internal Medicine | Admitting: Internal Medicine

## 2022-11-10 ENCOUNTER — Ambulatory Visit (INDEPENDENT_AMBULATORY_CARE_PROVIDER_SITE_OTHER): Payer: 59 | Admitting: Internal Medicine

## 2022-11-10 VITALS — BP 110/64 | HR 67 | Temp 97.1°F | Wt 184.0 lb

## 2022-11-10 DIAGNOSIS — N898 Other specified noninflammatory disorders of vagina: Secondary | ICD-10-CM | POA: Insufficient documentation

## 2022-11-10 NOTE — Progress Notes (Signed)
Subjective:    Patient ID: Ellen Kaiser, female    DOB: 16-Jan-1991, 32 y.o.   MRN: [id number]  HPI  Patient presents to clinic today with complaint of vaginal discharge.  This started 2 days ago.  She reports the discharge is thin and light with a foul odor.  She denies pelvic pain, abnormal vaginal bleeding, painful intercourse, urinary urgency, frequency, dysuria or blood in her urine.  She would like STD screening as she had sexual intercourse within the last week and the condom broke.  She has not tried anything OTC for this.  Review of Systems     Past Medical History:  Diagnosis Date   Allergy    Anxiety    Asthma    Bipolar 1 disorder (HCC)    BV (bacterial vaginosis)    Depression    HSV-2 seropositive     Current Outpatient Medications  Medication Sig Dispense Refill   ABILIFY MAINTENA 400 MG PRSY prefilled syringe Inject into the muscle.     albuterol (VENTOLIN HFA) 108 (90 Base) MCG/ACT inhaler INHALE 1 PUFF INTO THE LUNGS EVERY 6 HOURS AS NEEDED FOR WHEEZING OR SHORTNESS OF BREATH. 18 each 1   busPIRone (BUSPAR) 10 MG tablet TAKE 1 TABLET BY MOUTH 2 TO 3 TIMES DIALY AS NEEDED FOR ANXIETY     clonazePAM (KLONOPIN) 1 MG tablet Take 1 tablet by mouth 2 (two) times daily.     hydrOXYzine (ATARAX/VISTARIL) 50 MG tablet Take 100 mg by mouth daily.      naproxen (NAPROSYN) 500 MG tablet Take 1 tablet (500 mg total) by mouth daily as needed. 90 tablet 0   selenium sulfide (SELSUN) 2.5 % lotion Apply 1 Application topically daily as needed for irritation. 118 mL 1   No current facility-administered medications for this visit.    Allergies  Allergen Reactions   Fish Allergy Anaphylaxis and Swelling    Family History  Problem Relation Age of Onset   Depression Mother    Alcohol abuse Mother    Bipolar disorder Mother    Healthy Father    Healthy Sister    Osteoarthritis Maternal Grandmother    Dementia Paternal Grandmother     Social History    Socioeconomic History   Marital status: Single    Spouse name: Not on file   Number of children: Not on file   Years of education: college   Highest education level: Not on file  Occupational History   Occupation: disabled    Comment: PTSD  Tobacco Use   Smoking status: Every Day    Packs/day: 0.50    Years: 10.00    Additional pack years: 0.00    Total pack years: 5.00    Types: Cigarettes   Smokeless tobacco: Never  Vaping Use   Vaping Use: Former   Substances: THC  Substance and Sexual Activity   Alcohol use: Yes    Alcohol/week: 3.0 standard drinks of alcohol    Types: 3 Cans of beer per week    Comment: 3 beers weekly   Drug use: Yes    Types: Marijuana   Sexual activity: Yes    Partners: Male    Birth control/protection: Pill    Comment: Mirena  Other Topics Concern   Not on file  Social History Narrative   Not on file   Social Determinants of Health   Financial Resource Strain: Low Risk  (09/10/2022)   Overall Financial Resource Strain (CARDIA)    Difficulty of  Paying Living Expenses: Not very hard  Food Insecurity: No Food Insecurity (09/10/2022)   Hunger Vital Sign    Worried About Running Out of Food in the Last Year: Never true    Ran Out of Food in the Last Year: Never true  Transportation Needs: No Transportation Needs (09/10/2022)   PRAPARE - Administrator, Civil Service (Medical): No    Lack of Transportation (Non-Medical): No  Physical Activity: Insufficiently Active (09/10/2022)   Exercise Vital Sign    Days of Exercise per Week: 2 days    Minutes of Exercise per Session: 30 min  Stress: No Stress Concern Present (09/10/2022)   [employer] of Occupational Health - Occupational Stress Questionnaire    Feeling of Stress : Only a little  Social Connections: Socially Isolated (09/10/2022)   Social Connection and Isolation Panel [NHANES]    Frequency of Communication with Friends and Family: More than three times a week     Frequency of Social Gatherings with Friends and Family: Once a week    Attends Religious Services: Never    Database administrator or Organizations: No    Attends Banker Meetings: Never    Marital Status: Never married  Intimate Partner Violence: Not At Risk (09/10/2022)   Humiliation, Afraid, Rape, and Kick questionnaire    Fear of Current or Ex-Partner: No    Emotionally Abused: No    Physically Abused: No    Sexually Abused: No     Constitutional: Patient reports intermittent headaches.  Denies fever, malaise, fatigue, or abrupt weight changes.  Respiratory: Denies difficulty breathing, shortness of breath, cough or sputum production.   Cardiovascular: Denies chest pain, chest tightness, palpitations or swelling in the hands or feet.  Gastrointestinal: Denies abdominal pain, bloating, constipation, diarrhea or blood in the stool.  GU: Patient reports vaginal discharge and itching.  Denies urgency, frequency, pain with urination, burning sensation, blood in urine, odor.  No other specific complaints in a complete review of systems (except as listed in HPI above).  Objective:   Physical Exam  BP 110/64 (BP Location: Right Arm, Patient Position: Sitting, Cuff Size: Normal)   Pulse 67   Temp (!) 97.1 F (36.2 C) (Temporal)   Wt 184 lb (83.5 kg)   SpO2 99%   BMI 35.94 kg/m   Wt Readings from Last 3 Encounters:  10/25/22 185 lb (83.9 kg)  09/20/22 182 lb (82.6 kg)  09/10/22 182 lb (82.6 kg)    General: Appears her stated age, obese in NAD. Cardiovascular: Normal rate and rhythm.  Pulmonary/Chest: Normal effort and positive vesicular breath sounds. No respiratory distress. No wheezes, rales or ronchi noted.  Pelvic: Self swabbed. Neurological: Alert and oriented.   BMET    Component Value Date/Time   NA 138 10/25/2022 0937   K 4.1 10/25/2022 0937   CL 107 10/25/2022 0937   CO2 24 10/25/2022 0937   GLUCOSE 93 10/25/2022 0937   BUN 10 10/25/2022 0937    CREATININE 0.96 10/25/2022 0937   CALCIUM 9.1 10/25/2022 0937   GFRNONAA 81 05/26/2020 0947   GFRAA 94 05/26/2020 0947    Lipid Panel     Component Value Date/Time   CHOL 155 10/25/2022 0937   TRIG 94 10/25/2022 0937   HDL 93 10/25/2022 0937   CHOLHDL 1.7 10/25/2022 0937   LDLCALC 44 10/25/2022 0937    CBC    Component Value Date/Time   WBC 5.6 10/25/2022 0937   RBC 4.09 10/25/2022  0937   HGB 13.4 10/25/2022 0937   HCT 40.5 10/25/2022 0937   PLT 258 10/25/2022 0937   MCV 99.0 10/25/2022 0937   MCH 32.8 10/25/2022 0937   MCHC 33.1 10/25/2022 0937   RDW 12.7 10/25/2022 0937   LYMPHSABS 2,141 05/26/2020 0947   EOSABS 20 05/26/2020 0947   BASOSABS 29 05/26/2020 0947    Hgb A1C Lab Results  Component Value Date   HGBA1C 5.4 10/25/2022            Assessment & Plan:   Vaginal Itching, Discharge:  Will check wet prep for BV, yeast, gonorrhea, chlamydia and trichomoniasis Will treat with appropriate regimen once results are available for review   RTC in 6 months for follow-up chronic conditions Nicki Reaper, NP

## 2022-11-10 NOTE — Patient Instructions (Signed)
Vaginitis  Vaginitis is irritation and swelling of the vagina. Treatment will depend on the cause. What are the causes? It can be caused by: Bacteria. Yeast. A parasite. A virus. Low hormone levels. Bubble baths, scented tampons, and feminine sprays. Other things can change the balance of the yeast and bacteria that live in the vagina. These include: Antibiotic medicines. Not being clean enough. Some birth control methods. Sex. Infection. Diabetes. A weakened body defense system (immune system). What increases the risk? Smoking or being around someone who smokes. Using washes (douches), scented tampons, or scented pads. Wearing tight pants or thong underwear. Using birth control pills or an IUD. Having sex without a condom or having a lot of partners. Having an STI. Using a certain product to kill sperm (nonoxynol-9). Eating foods that are high in sugar. Having diabetes. Having low levels of a female hormone. Having a weakened body defense system. Being pregnant or breastfeeding. What are the signs or symptoms? Fluid coming from the vagina that is not normal. A bad smell. Itching, pain, or swelling. Pain with sex. Pain or burning when you pee (urinate). Sometimes there are no symptoms. How is this treated? Treatment may include: Antibiotic creams or pills. Antifungal medicines. Medicines to ease symptoms if you have a virus. Your sex partner should also be treated. Estrogen medicines. Avoiding scented soaps, sprays, or douches. Stopping use of products that caused irritation and then using a cream to treat symptoms. Follow these instructions at home: Lifestyle Keep the area around your vagina clean and dry. Avoid using soap. Rinse the area with water. Until your doctor says it is okay: Do not use washes for the vagina. Do not use tampons. Do not have sex. Wipe from front to back after going to the bathroom. When your doctor says it is okay, practice safe sex  and use condoms. General instructions Take over-the-counter and prescription medicines only as told by your doctor. If you were prescribed an antibiotic medicine, take or use it as told by your doctor. Do not stop taking or using it even if you start to feel better. Keep all follow-up visits. How is this prevented? Do not use things that can irritate the vagina, such as fabric softeners. Avoid these products if they are scented: Sprays. Detergents. Tampons. Products for cleaning the vagina. Soaps or bubble baths. Let air reach your vagina. To do this: Wear cotton underwear. Do not wear: Underwear while you sleep. Tight pants. Thong underwear. Underwear or nylons without a cotton panel. Take off any wet clothing, such as bathing suits, as soon as you can. Practice safe sex and use condoms. Contact a doctor if: You have pain in your belly or in the area between your hips. You have a fever or chills. Your symptoms last for more than 2-3 days. Get help right away if: You have a fever and your symptoms get worse all of a sudden. Summary Vaginitis is irritation and swelling of the vagina. Treatment will depend on the cause of the condition. Do not use washes or tampons or have sex until your doctor says it is okay. This information is not intended to replace advice given to you by your health care provider. Make sure you discuss any questions you have with your health care provider. Document Revised: 12/06/2019 Document Reviewed: 12/06/2019 Elsevier Patient Education  2023 Elsevier Inc.  

## 2022-11-11 LAB — CERVICOVAGINAL ANCILLARY ONLY
Bacterial Vaginitis (gardnerella): POSITIVE — AB
Candida Glabrata: NEGATIVE
Candida Vaginitis: NEGATIVE
Chlamydia: NEGATIVE
Comment: NEGATIVE
Comment: NEGATIVE
Comment: NEGATIVE
Comment: NEGATIVE
Comment: NEGATIVE
Comment: NORMAL
Neisseria Gonorrhea: NEGATIVE
Trichomonas: NEGATIVE

## 2022-11-12 MED ORDER — METRONIDAZOLE 500 MG PO TABS: 500.0000 mg | ORAL_TABLET | Freq: Two times a day (BID) | ORAL | 0 refills | Status: DC

## 2022-11-12 NOTE — Addendum Note (Signed)
Addended by: Lorre Munroe on: 11/12/2022 08:08 AM   Modules accepted: Orders

## 2022-12-29 ENCOUNTER — Other Ambulatory Visit: Payer: Self-pay | Admitting: Internal Medicine

## 2022-12-29 NOTE — Telephone Encounter (Signed)
Requested Prescriptions  Pending Prescriptions Disp Refills   albuterol (VENTOLIN HFA) 108 (90 Base) MCG/ACT inhaler [Pharmacy Med Name: ALBUTEROL HFA (PROAIR) INHALER] 8.5 each 0    Sig: INHALE 1 PUFF INTO THE LUNGS EVERY 6 HOURS AS NEEDED FOR WHEEZING OR SHORTNESS OF BREATH.     Pulmonology:  Beta Agonists 2 Passed - 12/29/2022  2:33 AM      Passed - Last BP in normal range    BP Readings from Last 1 Encounters:  11/10/22 110/64         Passed - Last Heart Rate in normal range    Pulse Readings from Last 1 Encounters:  11/10/22 67         Passed - Valid encounter within last 12 months    Recent Outpatient Visits           1 month ago Vaginal itching   [place] [facility] [place], Salvadore Oxford, NP   2 months ago Encounter for general adult medical examination with abnormal findings   [place] [facility] [place], Salvadore Oxford, NP   5 months ago Tinea versicolor   [place] [facility] [place], Salvadore Oxford, NP   5 months ago Functional diarrhea   [place] [facility] [place], Salvadore Oxford, NP   8 months ago Bipolar 1 disorder [facility])   [facility] [place], Salvadore Oxford, NP       Future Appointments             Tomorrow  [facility] [place]    In 3 months [place], Salvadore Oxford, NP [place] [facility]

## 2022-12-30 ENCOUNTER — Ambulatory Visit
Admission: RE | Admit: 2022-12-30 | Discharge: 2022-12-30 | Disposition: A | Discharge status: Home / Self Care | Payer: 59 | Source: Ambulatory Visit | Attending: Urgent Care | Admitting: Urgent Care

## 2022-12-30 VITALS — BP 117/72 | HR 60 | Temp 98.7°F | Resp 16

## 2022-12-30 DIAGNOSIS — Z113 Encounter for screening for infections with a predominantly sexual mode of transmission: Secondary | ICD-10-CM

## 2022-12-30 DIAGNOSIS — Z114 Encounter for screening for human immunodeficiency virus [HIV]: Secondary | ICD-10-CM

## 2022-12-30 NOTE — ED Triage Notes (Signed)
Requesting full panel STD testing. Denies any symptoms, complaints, or exposures.

## 2022-12-30 NOTE — ED Provider Notes (Signed)
[facility]    CSN: [id number] Arrival date & time: 12/30/22  1015      History   Chief Complaint Chief Complaint  Patient presents with   SEXUALLY TRANSMITTED DISEASE    Testing - Entered by patient    HPI Ellen Kaiser is a 32 y.o. female.   HPI  Asymptomatic patient requesting full panel STD testing for routine screening.  Denies known exposure.  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 Authorizing Provider  ABILIFY MAINTENA 400 MG PRSY prefilled syringe Inject into the muscle. 08/04/21   [provider]  albuterol (VENTOLIN HFA) 108 (90 Base) MCG/ACT inhaler INHALE 1 PUFF INTO THE LUNGS EVERY 6 HOURS AS NEEDED FOR WHEEZING OR SHORTNESS OF BREATH. 12/29/22   Lorre Munroe, NP  busPIRone (BUSPAR) 10 MG tablet TAKE 1 TABLET BY MOUTH 2 TO 3 TIMES DIALY AS NEEDED FOR ANXIETY 08/27/20   [provider]  clonazePAM (KLONOPIN) 1 MG tablet Take 1  tablet by mouth 2 (two) times daily. 02/28/19   [provider]  hydrOXYzine (ATARAX/VISTARIL) 50 MG tablet Take 100 mg by mouth daily.  03/30/19   [provider]  metroNIDAZOLE (FLAGYL) 500 MG tablet Take 1 tablet (500 mg total) by mouth 2 (two) times daily. Do not drink alcohol while taking this medicine. 11/12/22   Lorre Munroe, NP  naproxen (NAPROSYN) 500 MG tablet Take 1 tablet (500 mg total) by mouth daily as needed. 10/25/22   Lorre Munroe, NP  selenium sulfide (SELSUN) 2.5 % lotion Apply 1 Application topically daily as needed for irritation. 07/30/22   Lorre Munroe, NP    Family History Family History  Problem Relation Age of Onset   Depression Mother    Alcohol abuse Mother    Bipolar disorder Mother    Healthy Father    Healthy Sister    Osteoarthritis Maternal Grandmother    Dementia Paternal Grandmother     Social History Social History   Tobacco Use   Smoking status: Every Day    Current packs/day: 0.50    Average packs/day: 0.5 packs/day for 10.0 years (5.0 ttl pk-yrs)  Types: Cigarettes   Smokeless tobacco: Never  Vaping Use   Vaping status: Former   Substances: THC  Substance Use Topics   Alcohol use: Yes    Alcohol/week: 3.0 standard drinks of alcohol    Types: 3 Cans of beer per week    Comment: 3 beers weekly   Drug use: Yes    Types: Marijuana     Allergies   Fish allergy   Review of Systems Review of Systems   Physical Exam Triage Vital Signs ED Triage Vitals  Encounter Vitals Group     BP 12/30/22 1035 117/72     Systolic BP Percentile --      Diastolic BP Percentile --      Pulse Rate 12/30/22 1035 60     Resp 12/30/22 1035 16     Temp 12/30/22 1035 98.7 F (37.1 C)     Temp Source 12/30/22 1035 Oral     SpO2 12/30/22 1035 99 %     Weight --      Height --      Head Circumference --      Peak Flow --      Pain Score 12/30/22 1036 0     Pain Loc --      Pain Education --      Exclude from Growth Chart --     No data found.  Updated Vital Signs BP 117/72 (BP Location: Right Arm)   Pulse 60   Temp 98.7 F (37.1 C) (Oral)   Resp 16   SpO2 99%   Visual Acuity Right Eye Distance:   Left Eye Distance:   Bilateral Distance:    Right Eye Near:   Left Eye Near:    Bilateral Near:     Physical Exam Vitals reviewed.  Constitutional:      Appearance: Normal appearance.  Skin:    General: Skin is warm and dry.  Neurological:     General: No focal deficit present.     Mental Status: She is alert and oriented to person, place, and time.  Psychiatric:        Mood and Affect: Mood normal.        Behavior: Behavior normal.      UC Treatments / Results  Labs (all labs ordered are listed, but only abnormal results are displayed) Labs Reviewed  HIV ANTIBODY (ROUTINE TESTING W REFLEX)  RPR  CERVICOVAGINAL ANCILLARY ONLY    EKG   Radiology No results found.  Procedures Procedures (including critical care time)  Medications Ordered in UC Medications - No data to display  Initial Impression / Assessment and Plan / UC Course  I have reviewed the triage vital signs and the nursing notes.  Pertinent labs & imaging results that were available during my care of the patient were reviewed by me and considered in my medical decision making (see chart for details).   Vaginal self swab and blood work obtained.  Results pending   Final Clinical Impressions(s) / UC Diagnoses   Final diagnoses:  Screen for STD (sexually transmitted disease)  Encounter for screening for human immunodeficiency virus (HIV)     Discharge Instructions      Results of today's testing will be posted to your MyChart account when available.  You will be contacted with any positive result requiring treatment.   ED Prescriptions   None    PDMP not reviewed this encounter.   Charma Igo, [place] 12/30/22 1102

## 2022-12-30 NOTE — Discharge Instructions (Signed)
Results of today's testing will be posted to your MyChart account when available.  You will be contacted with any positive result requiring treatment.

## 2022-12-31 LAB — CERVICOVAGINAL ANCILLARY ONLY
Bacterial Vaginitis (gardnerella): NEGATIVE
Candida Glabrata: NEGATIVE
Candida Vaginitis: NEGATIVE
Chlamydia: NEGATIVE
Comment: NEGATIVE
Comment: NEGATIVE
Comment: NEGATIVE
Comment: NEGATIVE
Comment: NEGATIVE
Comment: NORMAL
Neisseria Gonorrhea: NEGATIVE
Trichomonas: NEGATIVE

## 2022-12-31 LAB — HIV ANTIBODY (ROUTINE TESTING W REFLEX): HIV Screen 4th Generation wRfx: NONREACTIVE

## 2022-12-31 LAB — RPR: RPR Ser Ql: NONREACTIVE

## 2023-02-01 ENCOUNTER — Ambulatory Visit: Payer: Self-pay

## 2023-02-01 NOTE — Telephone Encounter (Signed)
    Chief Complaint: COVID exposure, no symptoms. Has asthma and did have to use inhaler for SOB today. Symptoms: Above Frequency: Exposed last week. Pertinent Negatives: Patient denies any symptoms. Disposition: [] ED /[] Urgent Care (no appt availability in office) / [x] Appointment(In office/virtual)/ []  [place] [facility]/ [] Home Care/ [] Refused Recommended Disposition /[] [place] [facility]/ []  Follow-up with PCP Additional Notes: No availability in the practice, scheduled  at [facility] per Fleet [place] suggestion.  Reason for Disposition  [1] COVID-19 EXPOSURE within last 14 days AND [2] requests COVID-19 lab test to return to work AND [3] NO symptoms  Answer Assessment - Initial Assessment Questions 1. COVID-19 EXPOSURE: "Please describe how you were exposed to someone with a COVID-19 infection."     Friend 2. PLACE of CONTACT: "Where were you when you were exposed to COVID-19?" (e.g., home, school, medical waiting room; which city?)     Last week 3. TYPE of CONTACT: "How much contact was there?" (e.g., sitting next to, live in same house, work in same office, same building)     Friend 4. DURATION of CONTACT: "How long were you in contact with the COVID-19 patient?" (e.g., a few seconds, passed by person, a few minutes, 15 minutes or longer, live with the patient)     Hours 5. MASK: "Were you wearing a mask?" "Was the other person wearing a mask?" Note: wearing a mask reduces the risk of an otherwise close contact.     No 6. DATE of CONTACT: "When did you have contact with a COVID-19 patient?" (e.g., how many days ago)     Last  7. COMMUNITY SPREAD: "Do you live in or have you traveled to an area where there are lots of COVID-19 cases (community spread)?" (See public health department website, if unsure)       N/a 8. SYMPTOMS: "Do you have any symptoms?" (e.g., fever, cough, breathing difficulty, loss of taste or smell)     SOB - Deep breath hurt a little 9. VACCINE: "Have you  gotten the COVID-19 vaccine?" If Yes, ask: "Which one, how many shots, when did you get it?"     N/a 10. PREGNANCY OR POSTPARTUM: "Is there any chance you are pregnant?" "When was your last menstrual period?" "Did you deliver in the last 2 weeks?"       No 11. HIGH RISK: "Do you have any heart or lung problems?" (e.g., asthma, COPD, heart failure) "Do you have a weak immune system or other risk factors?" (e.g., HIV positive, chemotherapy, renal failure, diabetes mellitus, sickle cell anemia, obesity)       Asthma  Protocols used: Coronavirus (COVID-19) Exposure-A-AH

## 2023-02-02 ENCOUNTER — Ambulatory Visit: Payer: 59 | Admitting: Physician Assistant

## 2023-03-07 ENCOUNTER — Ambulatory Visit: Payer: 59 | Admitting: Internal Medicine
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
## 2023-04-08 ENCOUNTER — Encounter: Payer: Self-pay | Admitting: Internal Medicine

## 2023-04-08 ENCOUNTER — Ambulatory Visit: Payer: 59 | Admitting: Internal Medicine

## 2023-04-08 ENCOUNTER — Other Ambulatory Visit (HOSPITAL_COMMUNITY)
Admission: RE | Admit: 2023-04-08 | Discharge: 2023-04-08 | Disposition: A | Discharge status: Home / Self Care | Payer: 59 | Source: Ambulatory Visit | Attending: Internal Medicine | Admitting: Internal Medicine

## 2023-04-08 VITALS — BP 106/68 | HR 67 | Ht 60.0 in | Wt 187.0 lb

## 2023-04-08 DIAGNOSIS — Z113 Encounter for screening for infections with a predominantly sexual mode of transmission: Secondary | ICD-10-CM | POA: Insufficient documentation

## 2023-04-08 DIAGNOSIS — K219 Gastro-esophageal reflux disease without esophagitis: Secondary | ICD-10-CM | POA: Diagnosis not present

## 2023-04-08 DIAGNOSIS — J4520 Mild intermittent asthma, uncomplicated: Secondary | ICD-10-CM | POA: Diagnosis not present

## 2023-04-08 DIAGNOSIS — Z6836 Body mass index (BMI) 36.0-36.9, adult: Secondary | ICD-10-CM

## 2023-04-08 DIAGNOSIS — F319 Bipolar disorder, unspecified: Secondary | ICD-10-CM

## 2023-04-08 DIAGNOSIS — G43009 Migraine without aura, not intractable, without status migrainosus: Secondary | ICD-10-CM

## 2023-04-08 DIAGNOSIS — Z23 Encounter for immunization: Secondary | ICD-10-CM | POA: Diagnosis not present

## 2023-04-08 DIAGNOSIS — R768 Other specified abnormal immunological findings in serum: Secondary | ICD-10-CM

## 2023-04-08 DIAGNOSIS — L732 Hidradenitis suppurativa: Secondary | ICD-10-CM

## 2023-04-08 DIAGNOSIS — E66812 Obesity, class 2: Secondary | ICD-10-CM

## 2023-04-08 DIAGNOSIS — F411 Generalized anxiety disorder: Secondary | ICD-10-CM

## 2023-04-08 MED ORDER — VARENICLINE TARTRATE (STARTER) 0.5 MG X 11 & 1 MG X 42 PO TBPK: ORAL_TABLET | ORAL | 0 refills | Status: DC

## 2023-04-08 NOTE — Assessment & Plan Note (Signed)
Continue albuterol as needed Will give Chantix for smoking cessation

## 2023-04-08 NOTE — Assessment & Plan Note (Signed)
Avoid triggers Continue Tylenol as needed 

## 2023-04-08 NOTE — Addendum Note (Signed)
Addended by: Judd Gaudier on: 04/08/2023 09:26 AM   Modules accepted: Orders

## 2023-04-08 NOTE — Patient Instructions (Signed)
Smoking Cessation You will learn methods to help you quit smoking and how quitting can help prevent a variety of health problems and improve your health. To view the content, go to this web address: [email or web address]  This video will expire on: 12/12/2024. If you need access to this video following this date, please reach out to the healthcare provider who assigned it to you. This information is not intended to replace advice given to you by your health care provider. Make sure you discuss any questions you have with your health care provider. Elsevier Patient Education  2024 [employer].

## 2023-04-08 NOTE — Assessment & Plan Note (Signed)
Continue Abilify, hydroxyzine, clonazepam and buspirone per psychiatry Support offered

## 2023-04-08 NOTE — Assessment & Plan Note (Signed)
Avoid foods or stress that trigger reflux Encourage weight loss as this can help reduce reflux symptoms Okay to take Tums OTC as needed

## 2023-04-08 NOTE — Assessment & Plan Note (Signed)
Not having frequent flares Not on preventative antiviral therapy

## 2023-04-08 NOTE — Assessment & Plan Note (Signed)
Encouraged diet and exercise for weight loss ?

## 2023-04-08 NOTE — Assessment & Plan Note (Signed)
Not currently having any flares Does not follow with dermatology

## 2023-04-08 NOTE — Progress Notes (Signed)
Subjective:    Patient ID: Ellen Kaiser, female    DOB: [birth year], 32 y.o.   MRN: [id number]  HPI  Patient presents to clinic today for 45-month follow-up of chronic conditions.  Migraines: These occur rarely.  She is not sure what triggers them.  She takes tylenol as needed with good relief of symptoms.  She does not follow with neurology.  Asthma: She denies chronic cough but she does have some shortness of breath.  She uses albuterol only as needed with good relief of symptoms.  There are no PFTs on file.  She does smoke and would like help with smoking cessation.  GERD: Triggered by stress.  She does not take any medication for this.  There is no upper GI on file.  Hidradenitis suppurativa: She is not taking any medications for this.  She does not follow with dermatology.  Anxiety and bipolar depression: Chronic, managed on abilify, hydroxyzine, clonazepam and buspirone.  She is currently seeing a therapist and a psychiatrist.  She denies SI/HI.  Genital herpes: She denies recent outbreak.  She is not taking any preventative antiviral medication at this time.  She would also like STD screening today.  Review of Systems     Past Medical History:  Diagnosis Date   Allergy    Anxiety    Asthma    Bipolar 1 disorder (HCC)    BV (bacterial vaginosis)    Depression    HSV-2 seropositive     Current Outpatient Medications  Medication Sig Dispense Refill   ABILIFY MAINTENA 400 MG PRSY prefilled syringe Inject into the muscle.     albuterol (VENTOLIN HFA) 108 (90 Base) MCG/ACT inhaler INHALE 1 PUFF INTO THE LUNGS EVERY 6 HOURS AS NEEDED FOR WHEEZING OR SHORTNESS OF BREATH. 8.5 each 0   busPIRone (BUSPAR) 10 MG tablet TAKE 1 TABLET BY MOUTH 2 TO 3 TIMES DIALY AS NEEDED FOR ANXIETY     clonazePAM (KLONOPIN) 1 MG tablet Take 1 tablet by mouth 2 (two) times daily.     hydrOXYzine (ATARAX/VISTARIL) 50 MG tablet Take 100 mg by mouth daily.      metroNIDAZOLE (FLAGYL) 500  MG tablet Take 1 tablet (500 mg total) by mouth 2 (two) times daily. Do not drink alcohol while taking this medicine. 14 tablet 0   naproxen (NAPROSYN) 500 MG tablet Take 1 tablet (500 mg total) by mouth daily as needed. 90 tablet 0   selenium sulfide (SELSUN) 2.5 % lotion Apply 1 Application topically daily as needed for irritation. 118 mL 1   No current facility-administered medications for this visit.    Allergies  Allergen Reactions   Fish Allergy Anaphylaxis and Swelling    Family History  Problem Relation Age of Onset   Depression Mother    Alcohol abuse Mother    Bipolar disorder Mother    Healthy Father    Healthy Sister    Osteoarthritis Maternal Grandmother    Dementia Paternal Grandmother     Social History   Socioeconomic History   Marital status: Single    Spouse name: Not on file   Number of children: Not on file   Years of education: college   Highest education level: Not on file  Occupational History   Occupation: disabled    Comment: PTSD  Tobacco Use   Smoking status: Every Day    Current packs/day: 0.50    Average packs/day: 0.5 packs/day for 10.0 years (5.0 ttl pk-yrs)    Types: Cigarettes  Smokeless tobacco: Never  Vaping Use   Vaping status: Former   Substances: THC  Substance and Sexual Activity   Alcohol use: Yes    Alcohol/week: 3.0 standard drinks of alcohol    Types: 3 Cans of beer per week    Comment: 3 beers weekly   Drug use: Yes    Types: Marijuana   Sexual activity: Yes    Partners: Male    Birth control/protection: Pill    Comment: Mirena  Other Topics Concern   Not on file  Social History Narrative   Not on file   Social Determinants of Health   Financial Resource Strain: Low Risk  (09/10/2022)   Overall Financial Resource Strain (CARDIA)    Difficulty of Paying Living Expenses: Not very hard  Food Insecurity: No Food Insecurity (09/10/2022)   Hunger Vital Sign    Worried About Running Out of Food in the Last Year:  Never true    Ran Out of Food in the Last Year: Never true  Transportation Needs: No Transportation Needs (09/10/2022)   PRAPARE - Administrator, Civil Service (Medical): No    Lack of Transportation (Non-Medical): No  Physical Activity: Insufficiently Active (09/10/2022)   Exercise Vital Sign    Days of Exercise per Week: 2 days    Minutes of Exercise per Session: 30 min  Stress: No Stress Concern Present (09/10/2022)   [employer] of Occupational Health - Occupational Stress Questionnaire    Feeling of Stress : Only a little  Social Connections: Socially Isolated (09/10/2022)   Social Connection and Isolation Panel [NHANES]    Frequency of Communication with Friends and Family: More than three times a week    Frequency of Social Gatherings with Friends and Family: Once a week    Attends Religious Services: Never    Database administrator or Organizations: No    Attends Banker Meetings: Never    Marital Status: Never married  Intimate Partner Violence: Not At Risk (09/10/2022)   Humiliation, Afraid, Rape, and Kick questionnaire    Fear of Current or Ex-Partner: No    Emotionally Abused: No    Physically Abused: No    Sexually Abused: No     Constitutional: Patient reports intermittent headaches.  Denies fever, malaise, fatigue, or abrupt weight changes.  HEENT: Denies eye pain, eye redness, ear pain, ringing in the ears, wax buildup, runny nose, nasal congestion, bloody nose, or sore throat. Respiratory: Patient reports intermittent shortness of breath.  Denies difficulty breathing, cough or sputum production.   Cardiovascular: Denies chest pain, chest tightness, palpitations or swelling in the hands or feet.  Gastrointestinal: Denies abdominal pain, bloating, constipation, diarrhea or blood in the stool.  GU: Denies urgency, frequency, pain with urination, burning sensation, blood in urine, odor or discharge. Musculoskeletal: Denies decrease in range  of motion, difficulty with gait, muscle pain or joint pain and swelling.  Skin: Denies redness, rashes, lesions or ulcercations.  Neurological: Denies dizziness, difficulty with memory, difficulty with speech or problems with balance and coordination.  Psych: Patient has a history of anxiety and depression.  Denies SI/HI.  No other specific complaints in a complete review of systems (except as listed in HPI above).  Objective:   Physical Exam  BP 106/68   Pulse 67   Ht 5' (1.524 m)   Wt 187 lb (84.8 kg)   LMP 03/06/2023   SpO2 100%   BMI 36.52 kg/m   Wt Readings from Last  3 Encounters:  11/10/22 184 lb (83.5 kg)  10/25/22 185 lb (83.9 kg)  09/20/22 182 lb (82.6 kg)    General: Appears her stated age, obese, in NAD. Skin: Warm, dry and intact.  HEENT: Head: normal shape and size; Eyes: sclera white, no icterus, conjunctiva pink, PERRLA and EOMs intact;  Cardiovascular: Normal rate and rhythm. S1,S2 noted.  No murmur, rubs or gallops noted. No JVD or BLE edema.  Pulmonary/Chest: Normal effort and positive vesicular breath sounds. No respiratory distress. No wheezes, rales or ronchi noted.  Abdomen: Soft and nontender. Normal bowel sounds.  Musculoskeletal: No difficulty with gait.  Neurological: Alert and oriented. Cranial nerves II-XII grossly intact. Coordination normal.  Psychiatric: Mood and affect normal. Behavior is normal. Judgment and thought content normal.    BMET    Component Value Date/Time   NA 138 10/25/2022 0937   K 4.1 10/25/2022 0937   CL 107 10/25/2022 0937   CO2 24 10/25/2022 0937   GLUCOSE 93 10/25/2022 0937   BUN 10 10/25/2022 0937   CREATININE 0.96 10/25/2022 0937   CALCIUM 9.1 10/25/2022 0937   GFRNONAA 81 05/26/2020 0947   GFRAA 94 05/26/2020 0947    Lipid Panel     Component Value Date/Time   CHOL 155 10/25/2022 0937   TRIG 94 10/25/2022 0937   HDL 93 10/25/2022 0937   CHOLHDL 1.7 10/25/2022 0937   LDLCALC 44 10/25/2022 0937    CBC     Component Value Date/Time   WBC 5.6 10/25/2022 0937   RBC 4.09 10/25/2022 0937   HGB 13.4 10/25/2022 0937   HCT 40.5 10/25/2022 0937   PLT 258 10/25/2022 0937   MCV 99.0 10/25/2022 0937   MCH 32.8 10/25/2022 0937   MCHC 33.1 10/25/2022 0937   RDW 12.7 10/25/2022 0937   LYMPHSABS 2,141 05/26/2020 0947   EOSABS 20 05/26/2020 0947   BASOSABS 29 05/26/2020 0947    Hgb A1C Lab Results  Component Value Date   HGBA1C 5.4 10/25/2022           Assessment & Plan:   Screen for STD:  Will check HIV, RPR and hep C We will check wet prep for gonorrhea, chlamydia and trichomonas Encourage safe sexual practices  RTC in 6 months for your annual exam Nicki Reaper, NP

## 2023-04-09 LAB — HEPATITIS C ANTIBODY: Hepatitis C Ab: NONREACTIVE

## 2023-04-09 LAB — RPR: RPR Ser Ql: NONREACTIVE

## 2023-04-09 LAB — HIV ANTIBODY (ROUTINE TESTING W REFLEX): HIV 1&2 Ab, 4th Generation: NONREACTIVE

## 2023-04-11 LAB — CERVICOVAGINAL ANCILLARY ONLY
Chlamydia: NEGATIVE
Comment: NEGATIVE
Comment: NEGATIVE
Comment: NORMAL
Neisseria Gonorrhea: NEGATIVE
Trichomonas: NEGATIVE

## 2023-04-27 ENCOUNTER — Ambulatory Visit: Payer: 59 | Admitting: Internal Medicine

## 2023-05-11 ENCOUNTER — Ambulatory Visit
Admission: EM | Admit: 2023-05-11 | Discharge: 2023-05-11 | Disposition: A | Discharge status: Home / Self Care | Payer: 59 | Attending: Emergency Medicine | Admitting: Emergency Medicine

## 2023-05-11 DIAGNOSIS — Z113 Encounter for screening for infections with a predominantly sexual mode of transmission: Secondary | ICD-10-CM | POA: Insufficient documentation

## 2023-05-11 DIAGNOSIS — Z202 Contact with and (suspected) exposure to infections with a predominantly sexual mode of transmission: Secondary | ICD-10-CM | POA: Diagnosis not present

## 2023-05-11 DIAGNOSIS — Z3202 Encounter for pregnancy test, result negative: Secondary | ICD-10-CM

## 2023-05-11 LAB — POCT URINE PREGNANCY: Preg Test, Ur: NEGATIVE

## 2023-05-11 NOTE — ED Provider Notes (Signed)
[facility]    CSN: [id number] Arrival date & time: 05/11/23  1020      History   Chief Complaint Chief Complaint  Patient presents with   SEXUALLY TRANSMITTED DISEASE    HPI Ellen Kaiser is a 32 y.o. female.  Patient presents with request for STD testing, including blood work.  She reports having unprotected sex recently and would like to be checked.  She denies symptoms.  She denies vaginal discharge, pelvic pain, rash, abdominal pain, dysuria, fever, or other symptoms.  The history is provided by the patient and medical records.    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 Authorizing Provider  ARISTADA 882 MG/3.2ML prefilled syringe SMARTSIG:3.2 Milliliter(s) IM Once a Month 04/25/23  Yes [provider]  ABILIFY MAINTENA 400 MG PRSY prefilled syringe Inject into the muscle. 08/04/21   [provider]  albuterol (VENTOLIN HFA) 108 (90 Base)  MCG/ACT inhaler INHALE 1 PUFF INTO THE LUNGS EVERY 6 HOURS AS NEEDED FOR WHEEZING OR SHORTNESS OF BREATH. 12/29/22   Lorre Munroe, NP  busPIRone (BUSPAR) 10 MG tablet TAKE 1 TABLET BY MOUTH 2 TO 3 TIMES DIALY AS NEEDED FOR ANXIETY 08/27/20   [provider]  clonazePAM (KLONOPIN) 1 MG tablet Take 1 tablet by mouth 2 (two) times daily. 02/28/19   [provider]  hydrOXYzine (ATARAX/VISTARIL) 50 MG tablet Take 100 mg by mouth daily.  03/30/19   [provider]  selenium sulfide (SELSUN) 2.5 % lotion Apply 1 Application topically daily as needed for irritation. 07/30/22   Lorre Munroe, NP  Varenicline Tartrate, Starter, (CHANTIX STARTING MONTH PAK) 0.5 MG X 11 & 1 MG X 42 TBPK Day 1-3 take 0.5mg  once daily with meal. Day 4-7 take 0.5mg  twice daily. Day 8+ take 1mg  twice a day. 1 month starter pack. 04/08/23   Lorre Munroe, NP    Family History Family History  Problem Relation Age of Onset   Depression Mother    Alcohol abuse Mother    Bipolar disorder Mother    Healthy Father    Healthy Sister    Osteoarthritis Maternal Grandmother  Dementia Paternal Grandmother     Social History Social History   Tobacco Use   Smoking status: Every Day    Current packs/day: 0.50    Average packs/day: 0.5 packs/day for 10.0 years (5.0 ttl pk-yrs)    Types: Cigarettes   Smokeless tobacco: Never  Vaping Use   Vaping status: Former   Substances: THC  Substance Use Topics   Alcohol use: Yes    Alcohol/week: 3.0 standard drinks of alcohol    Types: 3 Cans of beer per week    Comment: 3 beers weekly   Drug use: Yes    Types: Marijuana     Allergies   Fish allergy   Review of Systems Review of Systems  Constitutional:  Negative for chills and fever.  Gastrointestinal:  Negative for abdominal pain, nausea and vomiting.  Genitourinary:  Negative for dysuria, flank pain, frequency, hematuria, pelvic pain and vaginal discharge.  Skin:  Negative for rash and wound.      Physical Exam Triage Vital Signs ED Triage Vitals  Encounter Vitals Group     BP 05/11/23 1059 108/74     Systolic BP Percentile --      Diastolic BP Percentile --      Pulse Rate 05/11/23 1059 71     Resp 05/11/23 1059 18     Temp 05/11/23 1059 98.3 F (36.8 C)     Temp src --      SpO2 05/11/23 1059 98 %     Weight --      Height --      Head Circumference --      Peak Flow --      Pain Score 05/11/23 1046 0     Pain Loc --      Pain Education --      Exclude from Growth Chart --    No data found.  Updated Vital Signs BP 108/74   Pulse 71   Temp 98.3 F (36.8 C)   Resp 18   LMP 05/03/2023   SpO2 98%   Visual Acuity Right Eye Distance:   Left Eye Distance:   Bilateral Distance:    Right Eye Near:   Left Eye Near:    Bilateral Near:     Physical Exam Constitutional:      General: She is not in acute distress. HENT:     Mouth/Throat:     Mouth: Mucous membranes are moist.  Cardiovascular:     Rate and Rhythm: Normal rate and regular rhythm.     Heart sounds: Normal heart sounds.  Pulmonary:     Effort: Pulmonary effort is normal. No respiratory distress.     Breath sounds: Normal breath sounds.  Abdominal:     General: Bowel sounds are normal.     Palpations: Abdomen is soft.     Tenderness: There is no abdominal tenderness. There is no right CVA tenderness, left CVA tenderness, guarding or rebound.  Genitourinary:    Comments: Patient declines GU exam. Neurological:     Mental Status: She is alert.      UC Treatments / Results  Labs (all labs ordered are listed, but only abnormal results are displayed) Labs Reviewed  RPR  HIV ANTIBODY (ROUTINE TESTING W REFLEX)  POCT URINE PREGNANCY  CERVICOVAGINAL ANCILLARY ONLY    EKG   Radiology No results found.  Procedures Procedures (including critical care time)  Medications Ordered in UC Medications - No data to display  Initial Impression / Assessment and  Plan / UC Course  I  have reviewed the triage vital signs and the nursing notes.  Pertinent labs & imaging results that were available during my care of the patient were reviewed by me and considered in my medical decision making (see chart for details).   Screening for STDs, possible exposure to STD, negative pregnancy test.  Patient obtained vaginal self swab for testing.  HIV and syphilis pending also.  Discussed that we will call if test results are positive.  Discussed that she may require treatment at that time.  Discussed that sexual partner(s) may also require treatment.  Instructed patient to abstain from sexual activity for at least 7 days.  Instructed her to follow-up with her PCP or gynecologist.  Patient agrees to plan of care.    Final Clinical Impressions(s) / UC Diagnoses   Final diagnoses:  Screening for STD (sexually transmitted disease)  Possible exposure to STD  Negative pregnancy test     Discharge Instructions      Your test results are pending.  Do not have any sexual activity until all test results are back.     ED Prescriptions   None    PDMP not reviewed this encounter.   Mickie Bail, NP 05/11/23 1119

## 2023-05-11 NOTE — Discharge Instructions (Addendum)
Your test results are pending.  Do not have any sexual activity until all test results are back.

## 2023-05-11 NOTE — ED Triage Notes (Signed)
Patient to Urgent Care for STD testing.  Reports she had unprotected sex 10/25. Unsure of any exposure to STD's. Denies any current symptoms.

## 2023-05-12 LAB — CERVICOVAGINAL ANCILLARY ONLY
Bacterial Vaginitis (gardnerella): NEGATIVE
Candida Glabrata: NEGATIVE
Candida Vaginitis: NEGATIVE
Chlamydia: NEGATIVE
Comment: NEGATIVE
Comment: NEGATIVE
Comment: NEGATIVE
Comment: NEGATIVE
Comment: NEGATIVE
Comment: NORMAL
Neisseria Gonorrhea: NEGATIVE
Trichomonas: NEGATIVE

## 2023-05-12 LAB — RPR: RPR Ser Ql: NONREACTIVE

## 2023-05-12 LAB — HIV ANTIBODY (ROUTINE TESTING W REFLEX): HIV Screen 4th Generation wRfx: NONREACTIVE

## 2023-06-03 ENCOUNTER — Ambulatory Visit (INDEPENDENT_AMBULATORY_CARE_PROVIDER_SITE_OTHER): Payer: 59 | Admitting: Physician Assistant

## 2023-06-03 ENCOUNTER — Ambulatory Visit: Payer: Self-pay

## 2023-06-03 VITALS — BP 118/72 | HR 92 | Resp 16 | Ht 60.0 in | Wt 181.0 lb

## 2023-06-03 DIAGNOSIS — H5789 Other specified disorders of eye and adnexa: Secondary | ICD-10-CM | POA: Diagnosis not present

## 2023-06-03 MED ORDER — TRIAMCINOLONE ACETONIDE 40 MG/ML IJ SUSP
40.0000 mg | Freq: Once | INTRAMUSCULAR | Status: AC
Start: 2023-06-03 — End: 2023-06-03
  Administered 2023-06-03: 40 mg via INTRAMUSCULAR

## 2023-06-03 NOTE — Telephone Encounter (Signed)
  Chief Complaint: Right eye swollen Symptoms: right eye swollen  - pimple inner eye Frequency: yesterday pimple, today eye swollen Pertinent Negatives: Patient denies  Disposition: [] ED /[] Urgent Care (no appt availability in office) / [x] Appointment(In office/virtual)/ []  [facility]/ [] Home Care/ [] Refused Recommended Disposition /[] [facility]/ []  Follow-up with PCP Additional Notes: Pt has a pimple near her right eye by her nose. Pimple is painful and she is having difficulty opening her eye. Last night pt put salicylic acid on the pimple. Today eye is very swollen. Pt is seeing ok however. Appt for this afternoon at [facility] with Erin Mecum.   Summary: right eye irritation   The patient shares that they tried using a new skin treatment near their right eye and are now having irritation their eye  The patient shares that they have taken a Benadryl as well to help with their eye irritation this morning 06/03/23  The patient would like to speak with a member of clinical staff when possible  The patient shares that they are able to see out of their eye     Reason for Disposition  [1] SEVERE eyelid swelling on one side AND [2] red and painful (or tender to touch)  Answer Assessment - Initial Assessment Questions 1. ONSET: "When did the swelling start?" (e.g., minutes, hours, days)     Over night 2. LOCATION: "What part of the eyelids is swollen?"     Eye lids - looks like she got punched 3. SEVERITY: "How swollen is it?"     fairly 4. ITCHING: "Is there any itching?" If Yes, ask: "How much?"   (Scale 1-10; mild, moderate or severe)     no 5. PAIN: "Is the swelling painful to touch?" If Yes, ask: "How painful is it?"   (Scale 1-10; mild, moderate or severe)     no 6. FEVER: "Do you have a fever?" If Yes, ask: "What is it, how was it measured, and when did it start?"      no 7. CAUSE: "What do you think is causing the swelling?"     Salicylic  acid  Protocols used: Eye - Swelling-A-AH

## 2023-06-03 NOTE — Telephone Encounter (Signed)
Noted  

## 2023-06-03 NOTE — Patient Instructions (Addendum)
Based on your symptoms I believe that you have an allergic reaction along your right eye   You can use sterile eye flushes and lubricating eye drops (blink tears or Rephresh eye drops, NO Visine)  to assist with eye irritation and further resolution You can also use a warm compress over the eye to assist with swelling and matting - especially in the morning  If you have used makeup or mascara on that eye I recommend discarding it as this can cause recurrent symptoms. Thoroughly wash any makeup brushes and avoid using makeup while recovering from the swelling   I also recommend adding an antihistamine to your daily regimen This includes medications like Claritin, Allegra, Zyrtec- the generics of these work very well and are usually less expensive  If you notice the following please return to the office: lack of improvement, eyelid swelling, increased eye irritation If you notice the following please go to the ED: eye pressure causing displacement of the eye, vision changes, increased eye pain or foreign body sensation, fever

## 2023-06-03 NOTE — Progress Notes (Signed)
Acute Office Visit   Patient: Ellen Kaiser   DOB: 01-20-1991   32 y.o. Female  MRN: [id number] Visit Date: 06/03/2023  Today's healthcare provider: Oswaldo Conroy Lee Kuang, PA-C  Introduced myself to the patient as a Secondary school teacher and provided education on APPs in clinical practice.    Chief Complaint  Patient presents with   Eye Swelling    Right. Bump near inner eye causing pain. Started today   Subjective    HPI HPI     Eye Swelling    Additional comments: Right. Bump near inner eye causing pain. Started today      Last edited by Dollene Primrose, CMA on 06/03/2023  2:47 PM.      Concern for eye swelling  Onset: sudden  Duration: ongoing since yesterday Reports this started with a small pimple near the bridge of her nose. She used salicylic acid topical preparation  on the area and when she woke up with the AM her right eye was swollen. Reports itchiness of right eye and  She denies pain in the area, fever, chills, sinus pressure or congestion. She denies feeling ill  She denies changes to diet, medications, topicals  Interventions: warm compresses once this AM   She does not wear contacts     Medications: Outpatient Medications Prior to Visit  Medication Sig   ABILIFY MAINTENA 400 MG PRSY prefilled syringe Inject into the muscle.   albuterol (VENTOLIN HFA) 108 (90 Base) MCG/ACT inhaler INHALE 1 PUFF INTO THE LUNGS EVERY 6 HOURS AS NEEDED FOR WHEEZING OR SHORTNESS OF BREATH.   ARISTADA 882 MG/3.2ML prefilled syringe SMARTSIG:3.2 Milliliter(s) IM Once a Month   busPIRone (BUSPAR) 10 MG tablet TAKE 1 TABLET BY MOUTH 2 TO 3 TIMES DIALY AS NEEDED FOR ANXIETY   clonazePAM (KLONOPIN) 1 MG tablet Take 1 tablet by mouth 2 (two) times daily.   hydrOXYzine (ATARAX/VISTARIL) 50 MG tablet Take 100 mg by mouth daily.    Varenicline Tartrate, Starter, (CHANTIX STARTING MONTH PAK) 0.5 MG X 11 & 1 MG X 42 TBPK Day 1-3 take 0.5mg  once daily with meal. Day 4-7 take 0.5mg  twice  daily. Day 8+ take 1mg  twice a day. 1 month starter pack.   [DISCONTINUED] selenium sulfide (SELSUN) 2.5 % lotion Apply 1 Application topically daily as needed for irritation.   No facility-administered medications prior to visit.    Review of Systems  Eyes:  Positive for photophobia ("a little bit") and itching. Negative for pain, discharge and visual disturbance.       Eye swelling and pimple near right eye          Objective    BP 118/72   Pulse 92   Resp 16   Ht 5' (1.524 m)   Wt 181 lb (82.1 kg)   LMP 05/03/2023   SpO2 98%   BMI 35.35 kg/m     Physical Exam Vitals reviewed.  Constitutional:      General: She is awake.     Appearance: Normal appearance. She is well-developed and well-groomed.  HENT:     Head: Normocephalic and atraumatic.  Eyes:     General: Lids are everted, no foreign bodies appreciated. Gaze aligned appropriately.     Extraocular Movements: Extraocular movements intact.     Right eye: Normal extraocular motion and no nystagmus.     Left eye: Normal extraocular motion and no nystagmus.     Conjunctiva/sclera: Conjunctivae normal.  Pupils: Pupils are equal, round, and reactive to light.     Comments: Moderate to severe swelling involving upper and lower right eyelids  No EOM pain or deficits noted  No purulent drainage or foreign body  Neurological:     Mental Status: She is alert.  Psychiatric:        Behavior: Behavior is cooperative.       No results found for any visits on 06/03/23.  Assessment & Plan      No follow-ups on file.       Problem List Items Addressed This Visit   None Visit Diagnoses       Eye swelling, right    -  Primary   Relevant Medications   triamcinolone acetonide (KENALOG-40) injection 40 mg (Completed)     Acute, new concern I suspect patient likely has allergic reaction causing moderate to severe right eyelid swelling. Physical exam is overall normal with regards to pupillary reaction, EOM  movement, apparent nerve innervation. Will provide Kenalog 40 mg IM injection to assist with reaction Recommend that she starts taking second-generation antihistamine to assist with reaction.  Also recommend using lubricating eyedrops as needed Reviewed ED and return precautions Follow-up as needed for progressing or persistent symptoms   No follow-ups on file.   I, Keydi Giel E Ndia Sampath, PA-C, have reviewed all documentation for this visit. The documentation on 06/03/23 for the exam, diagnosis, procedures, and orders are all accurate and complete.   Jacquelin Hawking, MHS, PA-C [facility]

## 2023-06-04 ENCOUNTER — Other Ambulatory Visit: Payer: Self-pay

## 2023-06-04 ENCOUNTER — Emergency Department: Payer: 59

## 2023-06-04 ENCOUNTER — Inpatient Hospital Stay
Admission: EM | Admit: 2023-06-04 | Discharge: 2023-06-04 | DRG: 603 | Disposition: A | Discharge status: Home / Self Care | Payer: 59 | Attending: Osteopathic Medicine | Admitting: Osteopathic Medicine

## 2023-06-04 ENCOUNTER — Encounter: Payer: Self-pay | Admitting: Emergency Medicine

## 2023-06-04 DIAGNOSIS — L03213 Periorbital cellulitis: Principal | ICD-10-CM

## 2023-06-04 DIAGNOSIS — Z6835 Body mass index (BMI) 35.0-35.9, adult: Secondary | ICD-10-CM | POA: Diagnosis not present

## 2023-06-04 DIAGNOSIS — Z818 Family history of other mental and behavioral disorders: Secondary | ICD-10-CM | POA: Diagnosis not present

## 2023-06-04 DIAGNOSIS — F411 Generalized anxiety disorder: Secondary | ICD-10-CM | POA: Diagnosis present

## 2023-06-04 DIAGNOSIS — R220 Localized swelling, mass and lump, head: Secondary | ICD-10-CM

## 2023-06-04 DIAGNOSIS — F319 Bipolar disorder, unspecified: Secondary | ICD-10-CM | POA: Diagnosis present

## 2023-06-04 DIAGNOSIS — K219 Gastro-esophageal reflux disease without esophagitis: Secondary | ICD-10-CM | POA: Diagnosis present

## 2023-06-04 DIAGNOSIS — E66812 Obesity, class 2: Secondary | ICD-10-CM | POA: Diagnosis present

## 2023-06-04 DIAGNOSIS — J4520 Mild intermittent asthma, uncomplicated: Secondary | ICD-10-CM | POA: Diagnosis present

## 2023-06-04 DIAGNOSIS — Z91013 Allergy to seafood: Secondary | ICD-10-CM

## 2023-06-04 DIAGNOSIS — L732 Hidradenitis suppurativa: Secondary | ICD-10-CM | POA: Diagnosis present

## 2023-06-04 DIAGNOSIS — Z79899 Other long term (current) drug therapy: Secondary | ICD-10-CM

## 2023-06-04 LAB — CBC WITH DIFFERENTIAL/PLATELET
Abs Immature Granulocytes: 0.02 10*3/uL (ref 0.00–0.07)
Basophils Absolute: 0 10*3/uL (ref 0.0–0.1)
Basophils Relative: 1 %
Eosinophils Absolute: 0 10*3/uL (ref 0.0–0.5)
Eosinophils Relative: 0 %
HCT: 41.9 % (ref 36.0–46.0)
Hemoglobin: 14.5 g/dL (ref 12.0–15.0)
Immature Granulocytes: 0 %
Lymphocytes Relative: 26 %
Lymphs Abs: 2.1 10*3/uL (ref 0.7–4.0)
MCH: 32.9 pg (ref 26.0–34.0)
MCHC: 34.6 g/dL (ref 30.0–36.0)
MCV: 95 fL (ref 80.0–100.0)
Monocytes Absolute: 0.6 10*3/uL (ref 0.1–1.0)
Monocytes Relative: 8 %
Neutro Abs: 5.2 10*3/uL (ref 1.7–7.7)
Neutrophils Relative %: 65 %
Platelets: 298 10*3/uL (ref 150–400)
RBC: 4.41 MIL/uL (ref 3.87–5.11)
RDW: 14.1 % (ref 11.5–15.5)
WBC: 8 10*3/uL (ref 4.0–10.5)
nRBC: 0 % (ref 0.0–0.2)

## 2023-06-04 LAB — BASIC METABOLIC PANEL
Anion gap: 7 (ref 5–15)
BUN: 9 mg/dL (ref 6–20)
CO2: 23 mmol/L (ref 22–32)
Calcium: 8.8 mg/dL — ABNORMAL LOW (ref 8.9–10.3)
Chloride: 104 mmol/L (ref 98–111)
Creatinine, Ser: 0.85 mg/dL (ref 0.44–1.00)
GFR, Estimated: >60 mL/min (ref >60)
Glucose, Bld: 100 mg/dL — ABNORMAL HIGH (ref 70–99)
Potassium: 4.1 mmol/L (ref 3.5–5.1)
Sodium: 134 mmol/L — ABNORMAL LOW (ref 135–145)

## 2023-06-04 LAB — POC URINE PREG, ED: Preg Test, Ur: NEGATIVE

## 2023-06-04 MED ORDER — FLUCONAZOLE 150 MG PO TABS: 150.0000 mg | ORAL_TABLET | Freq: Once | ORAL | 0 refills | Status: DC | PRN

## 2023-06-04 MED ORDER — IOHEXOL 300 MG/ML  SOLN
75.0000 mL | Freq: Once | INTRAMUSCULAR | Status: AC | PRN
  Administered 2023-06-04: 75 mL via INTRAVENOUS

## 2023-06-04 MED ORDER — SODIUM CHLORIDE 0.9 % IV SOLN
3.0000 g | Freq: Once | INTRAVENOUS | Status: AC
  Administered 2023-06-04: 3 g via INTRAVENOUS
  Filled 2023-06-04: qty 8

## 2023-06-04 MED ORDER — SULFAMETHOXAZOLE-TRIMETHOPRIM 800-160 MG PO TABS: 1.0000 | ORAL_TABLET | Freq: Two times a day (BID) | ORAL | 0 refills | Status: DC

## 2023-06-04 MED ORDER — AMOXICILLIN-POT CLAVULANATE 500-125 MG PO TABS: 1.0000 | ORAL_TABLET | Freq: Two times a day (BID) | ORAL | 0 refills | Status: DC

## 2023-06-04 MED ORDER — DEXAMETHASONE SODIUM PHOSPHATE 10 MG/ML IJ SOLN
10.0000 mg | Freq: Once | INTRAMUSCULAR | Status: AC
  Administered 2023-06-04: 10 mg via INTRAVENOUS
  Filled 2023-06-04: qty 1

## 2023-06-04 MED ORDER — ONDANSETRON 4 MG PO TBDP: 4.0000 mg | ORAL_TABLET | Freq: Three times a day (TID) | ORAL | 0 refills | Status: DC | PRN

## 2023-06-04 NOTE — Discharge Summary (Signed)
Physician Discharge Summary   Patient: Ellen Kaiser MRN: [id number]  DOB: 11-30-1990   Admit:     Date of Admission: 06/04/2023 Admitted from: home   Discharge: Date of discharge: 06/04/23 Disposition: Home Condition at discharge: good  CODE STATUS: FULL CODE     Discharge Physician: Sunnie Nielsen, DO [facility]     PCP: Lorre Munroe, NP  Recommendations for Outpatient Follow-up:  Follow up with PCP Lorre Munroe, NP in 1 week / as needed to recheck cellulitis    Discharge Instructions     Call MD for:  severe uncontrolled pain   Complete by: As directed    Diet general   Complete by: As directed    Increase activity slowly   Complete by: As directed          Discharge Diagnoses: Principal Problem:   Preseptal cellulitis of right eye       Hospital Course:  HPI: Ellen Kaiser is a 32 y.o. female w/ PMH GAD, bipolar d/o, GERD, Hidradenitis suppuritiva, migraine, mild intermittent asthma. She presents to the ED from home 06/04/2023 with a chief complaint of right eye pain and swelling x 2 days. Patient originally tried to pop a pimple on her forehead on 06/02/2023 next to her right eye and applied acne treatment.  She went to bed and awoke morning 06/03/2023 with right eyelid swelling which progressed throughout the day.  Saw her PCP in the afternoon who gave her a steroid shot. Using compresses without relief of symptoms    In ED: VSS, no concerns on labs, R eye swelling, CT (+)R preseptal cellulitis, NO postseptal involvement, w/ 8mm skin associated phlegmon superior/medial to orbit, NO SIRS/Sepsis. Tx: dexamethasone 10 mg IV x1, ampicillin-sulbactam 3g IV x1. Admitted to hospitalist service to continue IV abx, carry over from night shift admit orders only. I examined aptietn this morning and she states she did not want to stay inpatient. We discussed risks/benefits. On exam, cellulitis R eye but no pain w/ eye movement,  nontender, confirmed she was never Rx abx outpatient so has not failed po. Discussed risk/benefit of stay for IV vs go on po - pt has capacity to decide and she understands return precautions.        Consultants:  none   Procedures: none           ASSESSMENT & PLAN:   Principal Problem:   Preseptal cellulitis of right eye     R Preseptal Cellulitis  8mm skin associated phlegmon superior/medial to orbit No SIRS/Sepsis  Treat w/ protocol for orbital cellulitis w/ amox-clav + tmp-smx to cover MRSA, to complete course 7 days total Pain control - ibuprofen Warm compresses  Low threshold for return to ED for reimaging and ophthalmology consultation if worsening despite treatment     GAD, bipolar d/o Continue home meds:    GERD Stable  TUMS as needed    Hidradenitis suppuritiva Stable  No intervention at this time    History migraine Stable  No intervention at this time    Mild intermittent asthma. Stable  Albuterol prn      Class 2 obesity based on BMI: Body mass index is 35.35 kg/m.  Underweight - under 18  overweight - 25 to 29 obese - 30 or more Class 1 obesity: BMI of 30.0 to 34 Class 2 obesity: BMI of 35.0 to 39 Class 3 obesity: BMI of 40.0 to 49 Super Morbid Obesity: BMI 50-59 Super-super Morbid  Obesity: BMI 60+ Significantly low or high BMI is associated with higher medical risk.  Weight management advised as adjunct to other disease management and risk reduction treatments         Discharge Instructions  Allergies as of 06/04/2023       Reactions   Fish Allergy Anaphylaxis, Swelling        Medication List     TAKE these medications    Abilify Maintena 400 MG Prsy prefilled syringe Generic drug: ARIPiprazole ER Inject into the muscle.   albuterol 108 (90 Base) MCG/ACT inhaler Commonly known as: VENTOLIN HFA INHALE 1 PUFF INTO THE LUNGS EVERY 6 HOURS AS NEEDED FOR WHEEZING OR SHORTNESS OF BREATH.   amoxicillin-clavulanate  500-125 MG tablet Commonly known as: AUGMENTIN Take 1 tablet by mouth 2 (two) times daily.   Aristada 882 MG/3.2ML prefilled syringe Generic drug: ARIPiprazole Lauroxil ER SMARTSIG:3.2 Milliliter(s) IM Once a Month   busPIRone 10 MG tablet Commonly known as: BUSPAR TAKE 1 TABLET BY MOUTH 2 TO 3 TIMES DIALY AS NEEDED FOR ANXIETY   clonazePAM 1 MG tablet Commonly known as: KLONOPIN Take 1 tablet by mouth 2 (two) times daily.   fluconazole 150 MG tablet Commonly known as: Diflucan Take 1 tablet (150 mg total) by mouth once as needed for up to 1 dose (vaginal yeast infection - if not resolved in 72 hours take second tablet).   hydrOXYzine 50 MG tablet Commonly known as: ATARAX Take 100 mg by mouth daily.   ondansetron 4 MG disintegrating tablet Commonly known as: ZOFRAN-ODT Take 1 tablet (4 mg total) by mouth every 8 (eight) hours as needed for nausea or vomiting.   sulfamethoxazole-trimethoprim 800-160 MG tablet Commonly known as: BACTRIM DS Take 1 tablet by mouth 2 (two) times daily for 7 days.   Varenicline Tartrate (Starter) 0.5 MG X 11 & 1 MG X 42 Tbpk Commonly known as: Chantix Starting Month Pak Day 1-3 take 0.5mg  once daily with meal. Day 4-7 take 0.5mg  twice daily. Day 8+ take 1mg  twice a day. 1 month starter pack.          Allergies  Allergen Reactions   Fish Allergy Anaphylaxis and Swelling     Subjective: pt reports would like to go home, no fever/chills, no vision change, no headache, no eye pain, no nausea. Tolerating diet, ambulating independently, no dizziness.    Discharge Exam: BP 120/88 (BP Location: Right Arm)   Pulse 68   Temp 98 F (36.7 C) (Oral)   Resp 16   Ht 5' (1.524 m)   Wt 82.1 kg   LMP 05/28/2023 (Exact Date)   SpO2 99%   BMI 35.35 kg/m  General: Pt is alert, awake, not in acute distress HEENT: edema R upper and lower eyelid, no significant erythema, EOMI nonpainful  Cardiovascular: RRR, S1/S2 +, no rubs, no  gallops Respiratory: CTA bilaterally, no wheezing, no rhonchi Abdominal: Soft, NT, ND, bowel sounds + Extremities: no edema, no cyanosis     The results of significant diagnostics from this hospitalization (including imaging, microbiology, ancillary and laboratory) are listed below for reference.     Microbiology: No results found for this or any previous visit (from the past 240 hours).   Labs: BNP (last 3 results) No results for input(s): "BNP" in the last 8760 hours. Basic Metabolic Panel: Recent Labs  Lab 06/04/23 0321  NA 134*  K 4.1  CL 104  CO2 23  GLUCOSE 100*  BUN 9  CREATININE 0.85  CALCIUM  8.8*   Liver Function Tests: No results for input(s): "AST", "ALT", "ALKPHOS", "BILITOT", "PROT", "ALBUMIN" in the last 168 hours. No results for input(s): "LIPASE", "AMYLASE" in the last 168 hours. No results for input(s): "AMMONIA" in the last 168 hours. CBC: Recent Labs  Lab 06/04/23 0321  WBC 8.0  NEUTROABS 5.2  HGB 14.5  HCT 41.9  MCV 95.0  PLT 298   Cardiac Enzymes: No results for input(s): "CKTOTAL", "CKMB", "CKMBINDEX", "TROPONINI" in the last 168 hours. BNP: Invalid input(s): "POCBNP" CBG: No results for input(s): "GLUCAP" in the last 168 hours. D-Dimer No results for input(s): "DDIMER" in the last 72 hours. Hgb A1c No results for input(s): "HGBA1C" in the last 72 hours. Lipid Profile No results for input(s): "CHOL", "HDL", "LDLCALC", "TRIG", "CHOLHDL", "LDLDIRECT" in the last 72 hours. Thyroid function studies No results for input(s): "TSH", "T4TOTAL", "T3FREE", "THYROIDAB" in the last 72 hours.  Invalid input(s): "FREET3" Anemia work up No results for input(s): "VITAMINB12", "FOLATE", "FERRITIN", "TIBC", "IRON", "RETICCTPCT" in the last 72 hours. Urinalysis    Component Value Date/Time   BILIRUBINUR negative 09/20/2022 0956   BILIRUBINUR negative 12/28/2019 0940   KETONESUR negative 09/20/2022 0956   PROTEINUR negative 09/20/2022 0956    PROTEINUR Negative 12/28/2019 0940   UROBILINOGEN 0.2 09/20/2022 0956   NITRITE Negative 09/20/2022 0956   NITRITE negative 12/28/2019 0940   LEUKOCYTESUR Negative 09/20/2022 0956   Sepsis Labs Recent Labs  Lab 06/04/23 0321  WBC 8.0   Microbiology No results found for this or any previous visit (from the past 240 hours). Imaging CT Maxillofacial W Contrast Result Date: 06/04/2023 CLINICAL DATA:  32 year old female with right eye swelling since yesterday morning. EXAM: CT MAXILLOFACIAL WITH CONTRAST TECHNIQUE: Multidetector CT imaging of the maxillofacial structures was performed with intravenous contrast. Multiplanar CT image reconstructions were also generated. RADIATION DOSE REDUCTION: This exam was performed according to the departmental dose-optimization program which includes automated exposure control, adjustment of the mA and/or kV according to patient size and/or use of iterative reconstruction technique. CONTRAST:  75mL OMNIPAQUE IOHEXOL 300 MG/ML  SOLN COMPARISON:  None Available. FINDINGS: Osseous: Mandible intact and normally located. Some right TMJ bony degenerative changes. No acute dental finding. Bilateral maxilla, zygoma, pterygoid, nasal bones intact. Visible skull base and cervical vertebrae appear intact and aligned. Visible calvarium intact. Orbits: Intact orbital walls. Disconjugate gaze. Right orbit preseptal space soft tissue swelling, thickening, edema with mild premalar space involvement. Just medial to the right superior orbit there is a small 8 mm skin associated rim enhancing area compatible with tiny phlegmon. No soft tissue gas or fluid collection. Globes and postseptal orbits soft tissues appear normal. Lacrimal glands appear symmetric. Sinuses: Clear. Hypoplastic frontal sinuses. Tympanic cavities and mastoids are clear. Soft tissues: Multiple facial piercings. Negative visible thyroid, larynx, parapharyngeal spaces, retropharyngeal space, sublingual space,  submandibular spaces, masticator and parotid spaces. Pharyngeal soft tissue contours are within normal limits, there is mild generalized tonsillar hypertrophy. Visible major vascular structures in neck, face, at the skull base appear patent. Cavernous sinus enhancement within normal limits. Upper cervical lymph nodes are within normal limits. Limited intracranial: Negative. IMPRESSION: 1. Right side Preseptal Cellulitis. No postseptal involvement or abscess; tiny 8 mm skin associated phlegmon superior and medial to the orbit. 2. Elsewhere negative CT appearance of the Face. Electronically Signed   By: Odessa Fleming M.D.   On: 06/04/2023 04:15      Time coordinating discharge: over 30 minutes  SIGNED:  Sunnie Nielsen DO [facility]

## 2023-06-04 NOTE — Discharge Instructions (Signed)
Take all antibiotics as prescribed. If worsening redness, swelling, eye pain, fever, headache, vision changes, or other concerns please seek medical care ASAP!

## 2023-06-04 NOTE — ED Notes (Signed)
Pt is requesting SL to be removed   MD aware  Encouraged pt to stay until MD is available

## 2023-06-04 NOTE — ED Triage Notes (Signed)
Pt to ED from home c/o right eye swelling since yesterday morning.  States put "Clean and Clear Acne Treatment" onto pimple next to right eye and went to bed, when pt woke up right eyelid swollen.  Denies visual changes, some pain with movement of eyebrow.  Took benadryl at home last around 1900 without relief and steroid shot given at PCP around 1440, used warm compresses without relief as well.  Pt did attempt to squeeze pimple prior.

## 2023-06-04 NOTE — ED Notes (Signed)
Pt is asking about admission time  States she would like to go home  This nurse messaged the MD

## 2023-06-04 NOTE — ED Provider Notes (Signed)
[facility] Provider Note    Event Date/Time   First MD Initiated Contact with Patient 06/04/23 0421     (approximate)   History   Facial Swelling   HPI  Ellen Kaiser is a 32 y.o. female who presents to the ED from home with a chief complaint of right eye pain and swelling x 2 days.  Patient originally tried to pop a pimple on her forehead next to her right eye and applied acne treatment.  She went to bed and awoke this morning with right eyelid swelling which has progressed throughout the day.  Saw her PCP in the afternoon who gave her a steroid shot. Using compresses without relief of symptoms.  Denies fever/chills, vision changes, chest pain, shortness of breath, abdominal pain, nausea, vomiting or dizziness.     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 Authorizing Provider  ABILIFY MAINTENA 400 MG PRSY prefilled syringe Inject into the muscle. 08/04/21   [provider]  albuterol (VENTOLIN HFA) 108 (90 Base) MCG/ACT inhaler INHALE 1 PUFF INTO  THE LUNGS EVERY 6 HOURS AS NEEDED FOR WHEEZING OR SHORTNESS OF BREATH. 12/29/22   Lorre Munroe, NP  ARISTADA 882 MG/3.2ML prefilled syringe SMARTSIG:3.2 Milliliter(s) IM Once a Month 04/25/23   [provider]  busPIRone (BUSPAR) 10 MG tablet TAKE 1 TABLET BY MOUTH 2 TO 3 TIMES DIALY AS NEEDED FOR ANXIETY 08/27/20   [provider]  clonazePAM (KLONOPIN) 1 MG tablet Take 1 tablet by mouth 2 (two) times daily. 02/28/19   [provider]  hydrOXYzine (ATARAX/VISTARIL) 50 MG tablet Take 100 mg by mouth daily.  03/30/19   [provider]  Varenicline Tartrate, Starter, (CHANTIX STARTING MONTH PAK) 0.5 MG X 11 & 1 MG X 42 TBPK Day 1-3 take 0.5mg  once daily with meal. Day 4-7 take 0.5mg  twice daily. Day 8+ take 1mg  twice a day. 1 month starter pack. 04/08/23   Lorre Munroe, NP     Allergies  Fish allergy   Family History   Family History  Problem Relation Age of Onset   Depression Mother    Alcohol abuse Mother    Bipolar disorder Mother    Healthy Father    Healthy Sister    Osteoarthritis Maternal Grandmother    Dementia Paternal Grandmother      Physical Exam  Triage Vital Signs: ED Triage Vitals  Encounter Vitals Group     BP 06/04/23 0208 (!) 122/97  Systolic BP Percentile --      Diastolic BP Percentile --      Pulse Rate 06/04/23 0208 60     Resp 06/04/23 0208 14     Temp 06/04/23 0208 98.7 F (37.1 C)     Temp Source 06/04/23 0208 Oral     SpO2 06/04/23 0208 99 %     Weight 06/04/23 0205 181 lb (82.1 kg)     Height 06/04/23 0205 5' (1.524 m)     Head Circumference --      Peak Flow --      Pain Score 06/04/23 0202 3     Pain Loc --      Pain Education --      Exclude from Growth Chart --     Updated Vital Signs: BP (!) 122/97 (BP Location: Right Arm)   Pulse 60   Temp 98.7 F (37.1 C) (Oral)   Resp 14   Ht 5' (1.524 m)   Wt 82.1 kg   LMP 05/28/2023 (Exact Date)   SpO2 99%   BMI 35.35 kg/m    General: Awake, mild  distress.  CV:  RRR.  Good peripheral perfusion.  Resp:  Normal effort.  CTAB. Abd:  No distention.  Other:  Right eye with moderate swelling.  PERRL.  EOMI.    ED Results / Procedures / Treatments  Labs (all labs ordered are listed, but only abnormal results are displayed) Labs Reviewed  BASIC METABOLIC PANEL - Abnormal; Notable for the following components:      Result Value   Sodium 134 (*)    Glucose, Bld 100 (*)    Calcium 8.8 (*)    All other components within normal limits  CBC WITH DIFFERENTIAL/PLATELET  POC URINE PREG, ED     EKG  None   RADIOLOGY I have independently visualized and interpreted patient's imaging study as well as noted the radiology interpretation:  CT maxillofacial: Preseptal cellulitis with tiny developing phlegmon  Official radiology report(s): CT Maxillofacial W Contrast Result Date: 06/04/2023 CLINICAL DATA:  32 year old female with right eye swelling since yesterday morning. EXAM: CT MAXILLOFACIAL WITH CONTRAST TECHNIQUE: Multidetector CT imaging of the maxillofacial structures was performed with intravenous contrast. Multiplanar CT image reconstructions were also generated. RADIATION DOSE REDUCTION: This exam was performed according to the departmental dose-optimization program which includes automated exposure control, adjustment of the mA and/or kV according to patient size and/or use of iterative reconstruction technique. CONTRAST:  75mL OMNIPAQUE IOHEXOL 300 MG/ML  SOLN COMPARISON:  None Available. FINDINGS: Osseous: Mandible intact and normally located. Some right TMJ bony degenerative changes. No acute dental finding. Bilateral maxilla, zygoma, pterygoid, nasal bones intact. Visible skull base and cervical vertebrae appear intact and aligned. Visible calvarium intact. Orbits: Intact orbital walls. Disconjugate gaze. Right orbit preseptal space soft tissue swelling, thickening, edema with mild premalar space involvement. Just medial to the  right superior orbit there is a small 8 mm skin associated rim enhancing area compatible with tiny phlegmon. No soft tissue gas or fluid collection. Globes and postseptal orbits soft tissues appear normal. Lacrimal glands appear symmetric. Sinuses: Clear. Hypoplastic frontal sinuses. Tympanic cavities and mastoids are clear. Soft tissues: Multiple facial piercings. Negative visible thyroid, larynx, parapharyngeal spaces, retropharyngeal space, sublingual space, submandibular spaces, masticator and parotid spaces. Pharyngeal soft tissue contours are within normal limits, there is mild generalized tonsillar hypertrophy. Visible major vascular structures in neck, face, at the skull base appear patent. Cavernous sinus enhancement within normal limits. Upper cervical  lymph nodes are within normal limits. Limited intracranial: Negative. IMPRESSION: 1. Right side Preseptal Cellulitis. No postseptal involvement or abscess; tiny 8 mm skin associated phlegmon superior and medial to the orbit. 2. Elsewhere negative CT appearance of the Face. Electronically Signed   By: Odessa Fleming M.D.   On: 06/04/2023 04:15     PROCEDURES:  Critical Care performed: No  Procedures   MEDICATIONS ORDERED IN ED: Medications  dexamethasone (DECADRON) injection 10 mg (has no administration in time range)  Ampicillin-Sulbactam (UNASYN) 3 g in sodium chloride 0.9 % 100 mL IVPB (has no administration in time range)  iohexol (OMNIPAQUE) 300 MG/ML solution 75 mL (75 mLs Intravenous Contrast Given 06/04/23 0400)     IMPRESSION / MDM / ASSESSMENT AND PLAN / ED COURSE  I reviewed the triage vital signs and the nursing notes.                             32 year old female presenting with right eye pain and swelling.  Differential diagnosis includes but is not limited to preseptal cellulitis, orbital cellulitis, contusion, hematoma, etc.  I personally reviewed patient's records and note her PCP office visit for right eye swelling from  yesterday.  Patient's presentation is most consistent with acute complicated illness / injury requiring diagnostic workup.  Laboratory results demonstrate normal WBC of 8, unremarkable electrolytes.  CT demonstrates preseptal cellulitis.  Will administer IV Decadron, start IV Unasyn and consult hospital services for evaluation and admission.      FINAL CLINICAL IMPRESSION(S) / ED DIAGNOSES   Final diagnoses:  Preseptal cellulitis  Facial swelling     Rx / DC Orders   ED Discharge Orders     None        Note:  This document was prepared using Dragon voice recognition software and may include unintentional dictation errors.   Irean Hong, MD 06/04/23 [phone]

## 2023-06-06 ENCOUNTER — Telehealth: Payer: Self-pay

## 2023-06-06 NOTE — Transitions of Care (Post Inpatient/ED Visit) (Signed)
06/06/2023  Name: Ellen Kaiser MRN: [id number] DOB: 09/18/1990  Today's TOC FU Call Status: Today's TOC FU Call Status:: Successful TOC FU Call Completed TOC FU Call Complete Date: 06/06/23 Patient's Name and Date of Birth confirmed.  Transition Care Management Follow-up Telephone Call Date of Discharge: 06/04/23 Discharge Facility: [facility]) Type of Discharge: Inpatient Admission Primary Inpatient Discharge Diagnosis:: cellulitis How have you been since you were released from the hospital?: Better Any questions or concerns?: No  Items Reviewed: Did you receive and understand the discharge instructions provided?: Yes Medications obtained,verified, and reconciled?: Yes (Medications Reviewed) Any new allergies since your discharge?: No Dietary orders reviewed?: Yes Do you have support at home?: No  Medications Reviewed Today: Medications Reviewed Today     Reviewed by Karena Addison, LPN (Licensed Practical Nurse) on 06/06/23 at 1548  Med List Status: <None>   Medication Order Taking? Sig Documenting Provider Last Dose Status Informant  ABILIFY MAINTENA 400 MG PRSY prefilled syringe [id number] No Inject into the muscle. [provider] Taking Active   albuterol (VENTOLIN HFA) 108 (90 Base) MCG/ACT inhaler [id number] No INHALE 1 PUFF INTO THE LUNGS EVERY 6 HOURS AS NEEDED FOR WHEEZING OR SHORTNESS OF BREATH. Lorre Munroe, NP Taking Active   amoxicillin-clavulanate (AUGMENTIN) 500-125 MG tablet [id number]  Take 1 tablet by mouth 2 (two) times daily. Sunnie Nielsen, DO  Active   ARISTADA 882 MG/3.2ML prefilled syringe [id number] No SMARTSIG:3.2 Milliliter(s) IM Once a Month [provider] Taking Active   busPIRone (BUSPAR) 10 MG tablet [id number] No TAKE 1 TABLET BY MOUTH 2 TO 3 TIMES DIALY AS NEEDED FOR ANXIETY [provider] Taking Active   clonazePAM (KLONOPIN) 1 MG tablet [id number] No Take 1 tablet by mouth  2 (two) times daily. [provider] Taking Active   fluconazole (DIFLUCAN) 150 MG tablet [id number]  Take 1 tablet (150 mg total) by mouth once as needed for up to 1 dose (vaginal yeast infection - if not resolved in 72 hours take second tablet). Sunnie Nielsen, DO  Active   hydrOXYzine (ATARAX/VISTARIL) 50 MG tablet [id number] No Take 100 mg by mouth daily.  [provider] Taking Active   ondansetron (ZOFRAN-ODT) 4 MG disintegrating tablet [id number]  Take 1 tablet (4 mg total) by mouth every 8 (eight) hours as needed for nausea or vomiting. Sunnie Nielsen, DO  Active   sulfamethoxazole-trimethoprim (BACTRIM DS) 800-160 MG tablet [id number]  Take 1 tablet by mouth 2 (two) times daily for 7 days. Sunnie Nielsen, DO  Active   Varenicline Tartrate, Starter, (CHANTIX STARTING MONTH PAK) 0.5 MG X 11 & 1 MG X 42 TBPK [id number] No Day 1-3 take 0.5mg  once daily with meal. Day 4-7 take 0.5mg  twice daily. Day 8+ take 1mg  twice a day. 1 month starter pack. Lorre Munroe, NP Taking Active             Home Care and Equipment/Supplies: Were Home Health Services Ordered?: NA Any new equipment or medical supplies ordered?: NA  Functional Questionnaire: Do you need assistance with bathing/showering or dressing?: No Do you need assistance with meal preparation?: No Do you need assistance with eating?: No Do you have difficulty maintaining continence: No Do you need assistance with getting out of bed/getting out of a chair/moving?: No Do you have difficulty managing or taking your medications?: No  Follow up appointments reviewed: PCP Follow-up appointment confirmed?: Yes Date of PCP follow-up appointment?: 06/10/23 Follow-up Provider: [facility] Follow-up appointment confirmed?:  NA Do you need transportation to your follow-up appointment?: No Do you understand care options if your condition(s) worsen?: Yes-patient verbalized understanding    SIGNATURE  Karena Addison, LPN [facility] Nurse Health Advisor Direct Dial [phone]

## 2023-06-07 NOTE — Telephone Encounter (Signed)
Will discuss at upcoming appointment

## 2023-06-10 ENCOUNTER — Ambulatory Visit (INDEPENDENT_AMBULATORY_CARE_PROVIDER_SITE_OTHER): Payer: 59 | Admitting: Internal Medicine

## 2023-06-10 ENCOUNTER — Encounter: Payer: Self-pay | Admitting: Internal Medicine

## 2023-06-10 VITALS — BP 118/78 | HR 97 | Ht 60.0 in | Wt 184.0 lb

## 2023-06-10 DIAGNOSIS — L700 Acne vulgaris: Secondary | ICD-10-CM

## 2023-06-10 DIAGNOSIS — L03213 Periorbital cellulitis: Secondary | ICD-10-CM | POA: Diagnosis not present

## 2023-06-10 MED ORDER — DOXYCYCLINE HYCLATE 100 MG PO TABS: 100.0000 mg | ORAL_TABLET | Freq: Two times a day (BID) | ORAL | 0 refills | Status: DC

## 2023-06-10 MED ORDER — SPIRONOLACTONE 50 MG PO TABS: 50.0000 mg | ORAL_TABLET | Freq: Every day | ORAL | 0 refills | Status: AC

## 2023-06-10 NOTE — Progress Notes (Signed)
Subjective:    Patient ID: Ellen Kaiser, female    DOB: Feb 04, 1991, 32 y.o.   MRN: [id number]  HPI  Discussed the use of AI scribe software for clinical note transcription with the patient, who gave verbal consent to proceed.  The patient, recently treated for preseptal cellulitis, presents for a hospital follow-up. The condition began with a small pimple on the right side of the face, which worsened significantly overnight, prompting a hospital visit. A CT scan revealed preseptal cellulitis, a high-risk condition due to the potential for the infection to invade the eyeball and sinus. The patient was initially considered for admission but was instead treated with IV antibiotics and discharged with a prescription for Augmentin and Septra.  The patient reports significant improvement since the hospital visit, having completed the prescribed antibiotics. However, she notes the presence of a residual lump and some discoloration in the affected area. She denies any vision changes, pressure behind the eye, sinus problems, or fevers.   Review of Systems   Past Medical History:  Diagnosis Date   Allergy    Anxiety    Asthma    Bipolar 1 disorder (HCC)    BV (bacterial vaginosis)    Depression    HSV-2 seropositive     Current Outpatient Medications  Medication Sig Dispense Refill   ABILIFY MAINTENA 400 MG PRSY prefilled syringe Inject into the muscle.     albuterol (VENTOLIN HFA) 108 (90 Base) MCG/ACT inhaler INHALE 1 PUFF INTO THE LUNGS EVERY 6 HOURS AS NEEDED FOR WHEEZING OR SHORTNESS OF BREATH. 8.5 each 0   amoxicillin-clavulanate (AUGMENTIN) 500-125 MG tablet Take 1 tablet by mouth 2 (two) times daily. 14 tablet 0   ARISTADA 882 MG/3.2ML prefilled syringe SMARTSIG:3.2 Milliliter(s) IM Once a Month     busPIRone (BUSPAR) 10 MG tablet TAKE 1 TABLET BY MOUTH 2 TO 3 TIMES DIALY AS NEEDED FOR ANXIETY     clonazePAM (KLONOPIN) 1 MG tablet Take 1 tablet by mouth 2 (two) times  daily.     fluconazole (DIFLUCAN) 150 MG tablet Take 1 tablet (150 mg total) by mouth once as needed for up to 1 dose (vaginal yeast infection - if not resolved in 72 hours take second tablet). 2 tablet 0   hydrOXYzine (ATARAX/VISTARIL) 50 MG tablet Take 100 mg by mouth daily.      ondansetron (ZOFRAN-ODT) 4 MG disintegrating tablet Take 1 tablet (4 mg total) by mouth every 8 (eight) hours as needed for nausea or vomiting. 20 tablet 0   sulfamethoxazole-trimethoprim (BACTRIM DS) 800-160 MG tablet Take 1 tablet by mouth 2 (two) times daily for 7 days. 14 tablet 0   Varenicline Tartrate, Starter, (CHANTIX STARTING MONTH PAK) 0.5 MG X 11 & 1 MG X 42 TBPK Day 1-3 take 0.5mg  once daily with meal. Day 4-7 take 0.5mg  twice daily. Day 8+ take 1mg  twice a day. 1 month starter pack. 53 each 0   No current facility-administered medications for this visit.    Allergies  Allergen Reactions   Fish Allergy Anaphylaxis and Swelling    Family History  Problem Relation Age of Onset   Depression Mother    Alcohol abuse Mother    Bipolar disorder Mother    Healthy Father    Healthy Sister    Osteoarthritis Maternal Grandmother    Dementia Paternal Grandmother     Social History   Socioeconomic History   Marital status: Single    Spouse name: Not on file   Number  of children: Not on file   Years of education: college   Highest education level: Not on file  Occupational History   Occupation: disabled    Comment: PTSD  Tobacco Use   Smoking status: Every Day    Current packs/day: 0.50    Average packs/day: 0.5 packs/day for 10.0 years (5.0 ttl pk-yrs)    Types: Cigarettes   Smokeless tobacco: Never  Vaping Use   Vaping status: Former   Substances: THC  Substance and Sexual Activity   Alcohol use: Yes    Alcohol/week: 3.0 standard drinks of alcohol    Types: 3 Cans of beer per week    Comment: 3 beers weekly   Drug use: Yes    Types: Marijuana   Sexual activity: Yes    Partners: Male     Birth control/protection: Pill    Comment: Mirena  Other Topics Concern   Not on file  Social History Narrative   Not on file   Social Drivers of Health   Financial Resource Strain: Low Risk  (09/10/2022)   Overall Financial Resource Strain (CARDIA)    Difficulty of Paying Living Expenses: Not very hard  Food Insecurity: No Food Insecurity (09/10/2022)   Hunger Vital Sign    Worried About Running Out of Food in the Last Year: Never true    Ran Out of Food in the Last Year: Never true  Transportation Needs: No Transportation Needs (09/10/2022)   PRAPARE - Administrator, Civil Service (Medical): No    Lack of Transportation (Non-Medical): No  Physical Activity: Insufficiently Active (09/10/2022)   Exercise Vital Sign    Days of Exercise per Week: 2 days    Minutes of Exercise per Session: 30 min  Stress: No Stress Concern Present (09/10/2022)   [employer] of Occupational Health - Occupational Stress Questionnaire    Feeling of Stress : Only a little  Social Connections: Socially Isolated (09/10/2022)   Social Connection and Isolation Panel [NHANES]    Frequency of Communication with Friends and Family: More than three times a week    Frequency of Social Gatherings with Friends and Family: Once a week    Attends Religious Services: Never    Database administrator or Organizations: No    Attends Banker Meetings: Never    Marital Status: Never married  Intimate Partner Violence: Not At Risk (09/10/2022)   Humiliation, Afraid, Rape, and Kick questionnaire    Fear of Current or Ex-Partner: No    Emotionally Abused: No    Physically Abused: No    Sexually Abused: No     Constitutional: Denies fever, malaise, fatigue, headache or abrupt weight changes.  HEENT: Denies eye pain, eye redness, ear pain, ringing in the ears, wax buildup, runny nose, nasal congestion, bloody nose, or sore throat. Respiratory: Denies difficulty breathing, shortness of breath,  cough or sputum production.   Cardiovascular: Denies chest pain, chest tightness, palpitations or swelling in the hands or feet.  Skin: Pt reports 2 lesions adjacent to the right eye. Denies redness, rashes, or ulcercations.    No other specific complaints in a complete review of systems (except as listed in HPI above).      Objective:   Physical Exam  BP 118/78   Pulse 97   Ht 5' (1.524 m)   Wt 184 lb (83.5 kg)   LMP 05/28/2023 (Exact Date)   SpO2 99%   BMI 35.94 kg/m   Wt Readings from Last 3  Encounters:  06/04/23 181 lb (82.1 kg)  06/03/23 181 lb (82.1 kg)  04/08/23 187 lb (84.8 kg)    General: Appears her stated age, obese, in NAD. Skin: 2 1 cm nodules noted to the right side of the nasal bridge and the medial aspect of the right eye. No redness, warmth or drainage noted. HEENT: Head: normal shape and size; Eyes: sclera white, no icterus, conjunctiva pink, PERRLA and EOMs intact;  Cardiovascular: Normal rate and rhythm.  Pulmonary/Chest: Normal effort and positive vesicular breath sounds. No respiratory distress. No wheezes, rales or ronchi noted.  Neurological: Alert and oriented.   BMET    Component Value Date/Time   NA 134 (L) 06/04/2023 0321   K 4.1 06/04/2023 0321   CL 104 06/04/2023 0321   CO2 23 06/04/2023 0321   GLUCOSE 100 (H) 06/04/2023 0321   BUN 9 06/04/2023 0321   CREATININE 0.85 06/04/2023 0321   CREATININE 0.96 10/25/2022 0937   CALCIUM 8.8 (L) 06/04/2023 0321   GFRNONAA >60 06/04/2023 0321   GFRNONAA 81 05/26/2020 0947   GFRAA 94 05/26/2020 0947    Lipid Panel     Component Value Date/Time   CHOL 155 10/25/2022 0937   TRIG 94 10/25/2022 0937   HDL 93 10/25/2022 0937   CHOLHDL 1.7 10/25/2022 0937   LDLCALC 44 10/25/2022 0937    CBC    Component Value Date/Time   WBC 8.0 06/04/2023 0321   RBC 4.41 06/04/2023 0321   HGB 14.5 06/04/2023 0321   HCT 41.9 06/04/2023 0321   PLT 298 06/04/2023 0321   MCV 95.0 06/04/2023 0321   MCH 32.9  06/04/2023 0321   MCHC 34.6 06/04/2023 0321   RDW 14.1 06/04/2023 0321   LYMPHSABS 2.1 06/04/2023 0321   MONOABS 0.6 06/04/2023 0321   EOSABS 0.0 06/04/2023 0321   BASOSABS 0.0 06/04/2023 0321    Hgb A1C Lab Results  Component Value Date   HGBA1C 5.4 10/25/2022            Assessment & Plan:  Assessment and Plan    Preseptal Cellulitis Improved with Augmentin and Septra. No vision changes, pressure behind the eye, sinus problems, or fevers. Persistent cystic acne lesion. -Start Doxycycline 100mg  BID for 7 days. -Start Spironolactone 50mg  daily for 1 week to reduce cystic fluid. -Advise warm compresses to the area. -If condition worsens (increased swelling, redness, fever), patient to go to the hospital [facility] recommended).  Acne Vulgaris Persistent cystic acne lesion. -Continue warm compresses. -Start Spironolactone 50mg  daily for 1 week to reduce cystic fluid. -Advise against picking at pimples to prevent worsening infection.        RTC in 5 months for annual exam Nicki Reaper, NP

## 2023-06-10 NOTE — Patient Instructions (Signed)
Preseptal Cellulitis, Adult Preseptal cellulitis is an infection of the eyelid and the tissues around the eye (periorbital area). The infection causes painful swelling and redness. This condition may also be called periorbital cellulitis. In most cases, the condition can be treated with antibiotic medicine at home. It is important to treat preseptal cellulitis right away so that it does not get worse. If it gets worse, it can spread to the eye socket and eye muscles (orbital cellulitis). Orbital cellulitis is a medical emergency. What are the causes? Preseptal cellulitis is most commonly caused by bacteria. In rare cases, it can be caused by a virus or fungus. The germs that cause preseptal cellulitis may come from: A sinus infection that spreads near the eyes. An injury near the eye, such as a scratch, puncture wound, animal bite, or insect bite. A skin rash, such as eczema or poison ivy, that becomes infected. An infected pimple on the eyelid (stye). Infection after eyelid surgery or injury. What increases the risk? You are more likely to develop this condition if: You have a weakened disease-fighting system (immune system). You have a medical condition that raises your risk for sinus infections, such as nasal polyps. What are the signs or symptoms? Symptoms of this condition include: Eyelids that are red and swollen and feel unusually hot. Fever. Difficulty opening the eye. Headache. Pain in the face. Symptoms of this condition usually develop suddenly. How is this diagnosed? This condition may be diagnosed based on your symptoms, your medical history, and an eye exam. You may also have tests, such as: Blood tests. Tests (cultures) to find out which specific bacteria are causing the infection. You may have a culture of any open wound or drainage. CT scan. MRI. This is less common. How is this treated? This condition is treated with antibiotic medicines. These may be given by mouth  (orally), through an IV, or as an injection. In rare cases, you may need surgery to drain an infected area. Follow these instructions at home: Medicines Take your antibiotic medicine as told by your health care provider. Do not stop taking the antibiotic even if you start to feel better Take over-the-counter and prescription medicines only as told by your health care provider. Eye Care Do not use eye drops without first getting approval from your health care provider. Do not touch or rub your eye. If you wear contact lenses, do not wear them until your health care provider approves. Keep the eye area clean and dry. Wash the eye area with a clean washcloth, warm water, and baby shampoo or mild soap. To help relieve discomfort, place a clean washcloth that is wet with warm water over your eye. Leave the washcloth on for a few minutes, then remove it. General instructions Wash your hands with soap and water often for at least 20 seconds. If soap and water are not available, use hand sanitizer. Do not use any products that contain nicotine or tobacco, such as cigarettes, e-cigarettes, and chewing tobacco. If you need help quitting, ask your health care provider. Drink enough fluid to keep your urine pale yellow. Do not drive or operate machinery until your health care provider says that it is safe. Ask your health care provider if it is safe for you to drive. Stay up to date on your vaccinations. Keep all follow-up visits. This includes any visits with an eye specialist (ophthalmologist) or dentist. This is important. Get help right away if: You have new symptoms. Your symptoms get worse or  do not get better with treatment. You have a fever. Your vision becomes blurry or gets worse in any way. Your eye looks like it is sticking out or bulging out (proptosis). You develop double vision. You have trouble moving your eyes or pain when moving your eyes You have a severe headache. You have neck  stiffness or severe neck pain. These symptoms may represent a serious problem that is an emergency. Do not wait to see if the symptoms will go away. Get medical help right away. Call your local emergency services (911 in the [place] Do not drive yourself to the hospital. Summary Preseptal cellulitis is an infection of the eyelid and the tissues around the eye. Symptoms of preseptal cellulitis usually develop suddenly and include red and swollen eyelids, fever, difficulty opening the eye, headache, and facial pain. This condition is treated with antibiotic medicines. Do not stop taking the antibiotic even if you start to feel better. Preseptal cellulitis can develop into orbital cellulitis, which is a medical emergency. If your condition does not improve or worsens, visit your heath care provider right away. This information is not intended to replace advice given to you by your health care provider. Make sure you discuss any questions you have with your health care provider. Document Revised: 10/09/2019 Document Reviewed: 10/10/2019 Elsevier Patient Education  2024 [employer].

## 2023-06-27 ENCOUNTER — Encounter: Payer: Self-pay | Admitting: Internal Medicine

## 2023-06-27 ENCOUNTER — Ambulatory Visit (INDEPENDENT_AMBULATORY_CARE_PROVIDER_SITE_OTHER): Payer: 59 | Admitting: Internal Medicine

## 2023-06-27 VITALS — BP 118/78 | Ht 60.0 in | Wt 185.6 lb

## 2023-06-27 DIAGNOSIS — L700 Acne vulgaris: Secondary | ICD-10-CM | POA: Diagnosis not present

## 2023-06-27 DIAGNOSIS — L03213 Periorbital cellulitis: Secondary | ICD-10-CM | POA: Diagnosis not present

## 2023-06-27 NOTE — Patient Instructions (Signed)
Preseptal Cellulitis, Adult Preseptal cellulitis is an infection of the eyelid and the tissues around the eye (periorbital area). The infection causes painful swelling and redness. This condition may also be called periorbital cellulitis. In most cases, the condition can be treated with antibiotic medicine at home. It is important to treat preseptal cellulitis right away so that it does not get worse. If it gets worse, it can spread to the eye socket and eye muscles (orbital cellulitis). Orbital cellulitis is a medical emergency. What are the causes? Preseptal cellulitis is most commonly caused by bacteria. In rare cases, it can be caused by a virus or fungus. The germs that cause preseptal cellulitis may come from: A sinus infection that spreads near the eyes. An injury near the eye, such as a scratch, puncture wound, animal bite, or insect bite. A skin rash, such as eczema or poison ivy, that becomes infected. An infected pimple on the eyelid (stye). Infection after eyelid surgery or injury. What increases the risk? You are more likely to develop this condition if: You have a weakened disease-fighting system (immune system). You have a medical condition that raises your risk for sinus infections, such as nasal polyps. What are the signs or symptoms? Symptoms of this condition include: Eyelids that are red and swollen and feel unusually hot. Fever. Difficulty opening the eye. Headache. Pain in the face. Symptoms of this condition usually develop suddenly. How is this diagnosed? This condition may be diagnosed based on your symptoms, your medical history, and an eye exam. You may also have tests, such as: Blood tests. Tests (cultures) to find out which specific bacteria are causing the infection. You may have a culture of any open wound or drainage. CT scan. MRI. This is less common. How is this treated? This condition is treated with antibiotic medicines. These may be given by mouth  (orally), through an IV, or as an injection. In rare cases, you may need surgery to drain an infected area. Follow these instructions at home: Medicines Take your antibiotic medicine as told by your health care provider. Do not stop taking the antibiotic even if you start to feel better Take over-the-counter and prescription medicines only as told by your health care provider. Eye Care Do not use eye drops without first getting approval from your health care provider. Do not touch or rub your eye. If you wear contact lenses, do not wear them until your health care provider approves. Keep the eye area clean and dry. Wash the eye area with a clean washcloth, warm water, and baby shampoo or mild soap. To help relieve discomfort, place a clean washcloth that is wet with warm water over your eye. Leave the washcloth on for a few minutes, then remove it. General instructions Wash your hands with soap and water often for at least 20 seconds. If soap and water are not available, use hand sanitizer. Do not use any products that contain nicotine or tobacco, such as cigarettes, e-cigarettes, and chewing tobacco. If you need help quitting, ask your health care provider. Drink enough fluid to keep your urine pale yellow. Do not drive or operate machinery until your health care provider says that it is safe. Ask your health care provider if it is safe for you to drive. Stay up to date on your vaccinations. Keep all follow-up visits. This includes any visits with an eye specialist (ophthalmologist) or dentist. This is important. Get help right away if: You have new symptoms. Your symptoms get worse or  do not get better with treatment. You have a fever. Your vision becomes blurry or gets worse in any way. Your eye looks like it is sticking out or bulging out (proptosis). You develop double vision. You have trouble moving your eyes or pain when moving your eyes You have a severe headache. You have neck  stiffness or severe neck pain. These symptoms may represent a serious problem that is an emergency. Do not wait to see if the symptoms will go away. Get medical help right away. Call your local emergency services (911 in the [place] Do not drive yourself to the hospital. Summary Preseptal cellulitis is an infection of the eyelid and the tissues around the eye. Symptoms of preseptal cellulitis usually develop suddenly and include red and swollen eyelids, fever, difficulty opening the eye, headache, and facial pain. This condition is treated with antibiotic medicines. Do not stop taking the antibiotic even if you start to feel better. Preseptal cellulitis can develop into orbital cellulitis, which is a medical emergency. If your condition does not improve or worsens, visit your heath care provider right away. This information is not intended to replace advice given to you by your health care provider. Make sure you discuss any questions you have with your health care provider. Document Revised: 10/09/2019 Document Reviewed: 10/10/2019 Elsevier Patient Education  2024 [employer].

## 2023-06-27 NOTE — Progress Notes (Signed)
 Subjective:    Patient ID: Ellen Kaiser, female    DOB: Jul 03, 1990, 33 y.o.   MRN: [id number]  HPI  Discussed the use of AI scribe software for clinical note transcription with the patient, who gave verbal consent to proceed.  The patient, recently treated for preseptal cellulitis 12/14, presents for a hospital follow-up. The condition began with a small pimple on the right side of the face, which worsened significantly overnight, prompting a hospital visit. A CT scan revealed preseptal cellulitis, a high-risk condition due to the potential for the infection to invade the eyeball and sinus. The patient was initially considered for admission but was instead treated with IV antibiotics and discharged with a prescription for Augmentin  and Septra .  However, a new lesion has developed, described as a 'bump' that 'moves around but won't pop.' The patient denies attempting to squeeze the lesion due to a previous experience that resulted in hospitalization. The lesion is associated with mild pain and swelling in the eye but is primarily a source of annoyance for the patient.  The patient has been on a regimen of doxycycline  and spironolactone  since the last visit. She reports no improvement in the size of the lesion.  She has not noticed that it is drained.  She has not tried to squeeze it due to concern for causing preseptal cellulitis as she has had in the past.  She has seen a dermatologist in the past about 2 years ago at [facility] for hidradenitis suppurativa but not for anything like this.  She has not tried anything additional OTC.      Review of Systems   Past Medical History:  Diagnosis Date   Allergy    Anxiety    Asthma    Bipolar 1 disorder (HCC)    BV (bacterial vaginosis)    Depression    HSV-2 seropositive     Current Outpatient Medications  Medication Sig Dispense Refill   ABILIFY MAINTENA 400 MG PRSY prefilled syringe Inject into the muscle.      albuterol  (VENTOLIN  HFA) 108 (90 Base) MCG/ACT inhaler INHALE 1 PUFF INTO THE LUNGS EVERY 6 HOURS AS NEEDED FOR WHEEZING OR SHORTNESS OF BREATH. 8.5 each 0   ARISTADA 882 MG/3.2ML prefilled syringe SMARTSIG:3.2 Milliliter(s) IM Once a Month     busPIRone (BUSPAR) 10 MG tablet TAKE 1 TABLET BY MOUTH 2 TO 3 TIMES DIALY AS NEEDED FOR ANXIETY     clonazePAM (KLONOPIN) 1 MG tablet Take 1 tablet by mouth 2 (two) times daily.     doxycycline  (VIBRA -TABS) 100 MG tablet Take 1 tablet (100 mg total) by mouth 2 (two) times daily. 14 tablet 0   fluconazole  (DIFLUCAN ) 150 MG tablet Take 1 tablet (150 mg total) by mouth once as needed for up to 1 dose (vaginal yeast infection - if not resolved in 72 hours take second tablet). 2 tablet 0   hydrOXYzine (ATARAX/VISTARIL) 50 MG tablet Take 100 mg by mouth daily.      ondansetron  (ZOFRAN -ODT) 4 MG disintegrating tablet Take 1 tablet (4 mg total) by mouth every 8 (eight) hours as needed for nausea or vomiting. 20 tablet 0   spironolactone  (ALDACTONE ) 50 MG tablet Take 1 tablet (50 mg total) by mouth daily. 7 tablet 0   No current facility-administered medications for this visit.    Allergies  Allergen Reactions   Fish Allergy Anaphylaxis and Swelling    Family History  Problem Relation Age of Onset   Depression Mother  Alcohol abuse Mother    Bipolar disorder Mother    Healthy Father    Healthy Sister    Osteoarthritis Maternal Grandmother    Dementia Paternal Grandmother     Social History   Socioeconomic History   Marital status: Single    Spouse name: Not on file   Number of children: Not on file   Years of education: college   Highest education level: Not on file  Occupational History   Occupation: disabled    Comment: PTSD  Tobacco Use   Smoking status: Every Day    Current packs/day: 0.50    Average packs/day: 0.5 packs/day for 10.0 years (5.0 ttl pk-yrs)    Types: Cigarettes   Smokeless tobacco: Never  Vaping Use   Vaping status:  Former   Substances: THC  Substance and Sexual Activity   Alcohol use: Yes    Alcohol/week: 3.0 standard drinks of alcohol    Types: 3 Cans of beer per week    Comment: 3 beers weekly   Drug use: Yes    Types: Marijuana   Sexual activity: Yes    Partners: Male    Birth control/protection: Pill    Comment: Mirena  Other Topics Concern   Not on file  Social History Narrative   Not on file   Social Drivers of Health   Financial Resource Strain: Low Risk  (09/10/2022)   Overall Financial Resource Strain (CARDIA)    Difficulty of Paying Living Expenses: Not very hard  Food Insecurity: No Food Insecurity (09/10/2022)   Hunger Vital Sign    Worried About Running Out of Food in the Last Year: Never true    Ran Out of Food in the Last Year: Never true  Transportation Needs: No Transportation Needs (09/10/2022)   PRAPARE - Administrator, Civil Service (Medical): No    Lack of Transportation (Non-Medical): No  Physical Activity: Insufficiently Active (09/10/2022)   Exercise Vital Sign    Days of Exercise per Week: 2 days    Minutes of Exercise per Session: 30 min  Stress: No Stress Concern Present (09/10/2022)   [employer] of Occupational Health - Occupational Stress Questionnaire    Feeling of Stress : Only a little  Social Connections: Socially Isolated (09/10/2022)   Social Connection and Isolation Panel [NHANES]    Frequency of Communication with Friends and Family: More than three times a week    Frequency of Social Gatherings with Friends and Family: Once a week    Attends Religious Services: Never    Database Administrator or Organizations: No    Attends Banker Meetings: Never    Marital Status: Never married  Intimate Partner Violence: Not At Risk (09/10/2022)   Humiliation, Afraid, Rape, and Kick questionnaire    Fear of Current or Ex-Partner: No    Emotionally Abused: No    Physically Abused: No    Sexually Abused: No     Constitutional:  Denies fever, malaise, fatigue, headache or abrupt weight changes.  HEENT: Denies eye pain, eye redness, ear pain, ringing in the ears, wax buildup, runny nose, nasal congestion, bloody nose, or sore throat. Respiratory: Denies difficulty breathing, shortness of breath, cough or sputum production.   Cardiovascular: Denies chest pain, chest tightness, palpitations or swelling in the hands or feet.  Skin: Pt reports lesion adjacent to the right eye. Denies redness, rashes, or ulcercations.    No other specific complaints in a complete review of systems (except as  listed in HPI above).      Objective:   Physical Exam  BP 118/78 (BP Location: Right Arm, Patient Position: Sitting, Cuff Size: Large)   Ht 5' (1.524 m)   Wt 185 lb 9.6 oz (84.2 kg)   LMP 05/28/2023 (Exact Date)   BMI 36.25 kg/m    Wt Readings from Last 3 Encounters:  06/10/23 184 lb (83.5 kg)  06/04/23 181 lb (82.1 kg)  06/03/23 181 lb (82.1 kg)    General: Appears her stated age, obese, in NAD. Skin: 1.5 cm pustule noted to the right side of the nasal bridge and the medial aspect of the right eye. No redness, warmth or drainage noted.  HEENT: Head: normal shape and size; Eyes: sclera white, no icterus, conjunctiva pink, PERRLA and EOMs intact;  Cardiovascular: Normal rate and rhythm.  Pulmonary/Chest: Normal effort and positive vesicular breath sounds. No respiratory distress. No wheezes, rales or ronchi noted.  Neurological: Alert and oriented.   BMET    Component Value Date/Time   NA 134 (L) 06/04/2023 0321   K 4.1 06/04/2023 0321   CL 104 06/04/2023 0321   CO2 23 06/04/2023 0321   GLUCOSE 100 (H) 06/04/2023 0321   BUN 9 06/04/2023 0321   CREATININE 0.85 06/04/2023 0321   CREATININE 0.96 10/25/2022 0937   CALCIUM 8.8 (L) 06/04/2023 0321   GFRNONAA >60 06/04/2023 0321   GFRNONAA 81 05/26/2020 0947   GFRAA 94 05/26/2020 0947    Lipid Panel     Component Value Date/Time   CHOL 155 10/25/2022 0937    TRIG 94 10/25/2022 0937   HDL 93 10/25/2022 0937   CHOLHDL 1.7 10/25/2022 0937   LDLCALC 44 10/25/2022 0937    CBC    Component Value Date/Time   WBC 8.0 06/04/2023 0321   RBC 4.41 06/04/2023 0321   HGB 14.5 06/04/2023 0321   HCT 41.9 06/04/2023 0321   PLT 298 06/04/2023 0321   MCV 95.0 06/04/2023 0321   MCH 32.9 06/04/2023 0321   MCHC 34.6 06/04/2023 0321   RDW 14.1 06/04/2023 0321   LYMPHSABS 2.1 06/04/2023 0321   MONOABS 0.6 06/04/2023 0321   EOSABS 0.0 06/04/2023 0321   BASOSABS 0.0 06/04/2023 0321    Hgb A1C Lab Results  Component Value Date   HGBA1C 5.4 10/25/2022            Assessment & Plan:  Assessment and Plan  Preseptal cellulitis Recurrent facial abscess, no improvement with antibiotics or spironolacton. Concern for potential reinfection of the eye if drained improperly. -Consult with supervising physician for further management. -Referral to Dermatology for further evaluation and management.    RTC in 4 months for annual exam Angeline Laura, NP

## 2023-07-04 ENCOUNTER — Ambulatory Visit: Payer: 59 | Admitting: Dermatology

## 2023-07-12 ENCOUNTER — Ambulatory Visit (INDEPENDENT_AMBULATORY_CARE_PROVIDER_SITE_OTHER): Payer: 59 | Admitting: Dermatology

## 2023-07-12 DIAGNOSIS — L700 Acne vulgaris: Secondary | ICD-10-CM

## 2023-07-12 DIAGNOSIS — L732 Hidradenitis suppurativa: Secondary | ICD-10-CM

## 2023-07-12 DIAGNOSIS — Z79899 Other long term (current) drug therapy: Secondary | ICD-10-CM | POA: Diagnosis not present

## 2023-07-12 MED ORDER — TRETINOIN 0.025 % EX CREA: TOPICAL_CREAM | Freq: Every day | CUTANEOUS | 2 refills | Status: DC

## 2023-07-12 MED ORDER — DOXYCYCLINE MONOHYDRATE 100 MG PO CAPS: 100.0000 mg | ORAL_CAPSULE | Freq: Two times a day (BID) | ORAL | 2 refills | Status: DC

## 2023-07-12 NOTE — Progress Notes (Signed)
Follow-Up Visit   Subjective  Ellen Kaiser is a 33 y.o. female who presents for the following: bumps at glabella area, present for about a month. Patient had a bump near right eyebrow that she picked at and ended up with cellulitis at right eye and was put on antibiotics for 1 week. Original spot did resolve with the antibiotics. She was also prescribed doxycycline and spironolactone on 12/20 to take for 1 week. Now she has another bump just under the spot where the original bump was, some mild pain. Patient does have hx of HS.  The following portions of the chart were reviewed this encounter and updated as appropriate: medications, allergies, medical history  Review of Systems:  No other skin or systemic complaints except as noted in HPI or Assessment and Plan.  Objective  Well appearing patient in no apparent distress; mood and affect are within normal limits.  .  A focused examination was performed of the following areas: Face, scalp, axilla  Relevant exam findings are noted in the Assessment and Plan.    Assessment & Plan   HIDRADENITIS SUPPURATIVA chronic flared not at goal Exam: hyperpigmented postinflammatory nodule on left axilla  Hidradenitis Suppurativa is a chronic; persistent; non-curable, but treatable condition due to abnormal inflamed sweat glands in the body folds (axilla, inframammary, groin, medial thighs), causing recurrent painful draining cysts and scarring. It can be associated with severe scarring acne and cysts; also abscesses and scarring of scalp. The goal is control and prevention of flares, as it is not curable. Scars are permanent and can be thickened. Treatment may include daily use of topical medication and oral antibiotics.  Oral isotretinoin may also be helpful.  For some cases, Humira or Cosentyx (biologic injections) may be prescribed to decrease the inflammatory process and prevent flares.  When indicated, inflamed cysts may also be treated  surgically.  Treatment Plan: Discussed Cosentyx.  Pending labs, will start Cosentyx 300 mg weekly x 5 weeks then monthly.   No hx of IBD or Crohn's.   Reviewed risks of biologics including immunosuppression, infections, injection site reaction, and failure to improve condition. Goal is control of skin condition, not cure.  Some older biologics such as Humira and Enbrel may slightly increase risk of malignancy and may worsen congestive heart failure.  Taltz and Cosentyx may cause inflammatory bowel disease to flare. The use of biologics requires long term medication management, including periodic office visits and monitoring of blood work.  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  Start tretinoin 0.025% cr twice daily increasing to nightly as tolerated.   Doxycycline should be taken with food to prevent nausea. Do not lay down for 30 minutes after taking. Be cautious with sun exposure and use good sun protection while on this medication. Pregnant women should not take this medication.   Topical retinoid medications like tretinoin/Retin-A, adapalene/Differin, tazarotene/Fabior, and Epiduo/Epiduo Forte can cause dryness and irritation when first started. Only apply a pea-sized amount to the entire affected area. Avoid applying it around the eyes, edges of mouth and creases at the nose. If you experience irritation, use a good moisturizer first and/or apply the medicine less often. If you are doing well with the medicine, you can increase how often you use it until you are applying every night. Be careful with sun protection while using this medication as it can make you sensitive to  the sun. This medicine should not be used by pregnant women.      ACNE VULGARIS   Related Medications tretinoin (RETIN-A) 0.025 % cream Apply topically at bedtime. As  directed. doxycycline (MONODOX) 100 MG capsule Take 1 capsule (100 mg total) by mouth 2 (two) times daily. Take with food HIDRADENITIS SUPPURATIVA   Related Procedures Comprehensive metabolic panel CBC with Differential/Platelet Hepatitis B surface antigen Hepatitis B surface antibody,qualitative Hepatitis C antibody HIV Antibody (routine testing w rflx) Hepatitis B core antibody, total QuantiFERON-TB Gold Plus LONG-TERM USE OF HIGH-RISK MEDICATION    Return in about 1 month (around 08/12/2023) for Hidradenitis, acne, with Dr. Katrinka Blazing.  Anise Salvo, RMA, am acting as scribe for Elie Goody, MD .   Documentation: I have reviewed the above documentation for accuracy and completeness, and I agree with the above.  Elie Goody, MD

## 2023-07-12 NOTE — Patient Instructions (Addendum)
Treatment Plan: Start doxycycline 100 mg twice daily with food.  Start tretinoin 0.025% cr twice daily increasing to nightly as tolerated.   Doxycycline should be taken with food to prevent nausea. Do not lay down for 30 minutes after taking. Be cautious with sun exposure and use good sun protection while on this medication. Pregnant women should not take this medication.   Topical retinoid medications like tretinoin/Retin-A, adapalene/Differin, tazarotene/Fabior, and Epiduo/Epiduo Forte can cause dryness and irritation when first started. Only apply a pea-sized amount to the entire affected area. Avoid applying it around the eyes, edges of mouth and creases at the nose. If you experience irritation, use a good moisturizer first and/or apply the medicine less often. If you are doing well with the medicine, you can increase how often you use it until you are applying every night. Be careful with sun protection while using this medication as it can make you sensitive to the sun. This medicine should not be used by pregnant women.   Reviewed risks of biologics including immunosuppression, infections, injection site reaction, and failure to improve condition. Goal is control of skin condition, not cure.  Some older biologics such as Humira and Enbrel may slightly increase risk of malignancy and may worsen congestive heart failure.  Taltz and Cosentyx may cause inflammatory bowel disease to flare. The use of biologics requires long term medication management, including periodic office visits and monitoring of blood work.  Due to recent changes in healthcare laws, you may see results of your pathology and/or laboratory studies on MyChart before the doctors have had a chance to review them. We understand that in some cases there may be results that are confusing or concerning to you. Please understand that not all results are received at the same time and often the doctors may need to interpret multiple results  in order to provide you with the best plan of care or course of treatment. Therefore, we ask that you please give [place] 2 business days to thoroughly review all your results before contacting the office for clarification. Should we see a critical lab result, you will be contacted sooner.   If You Need Anything After Your Visit  If you have any questions or concerns for your doctor, please call our main line at [phone] and press option 4 to reach your doctor's medical assistant. If no one answers, please leave a voicemail as directed and we will return your call as soon as possible. Messages left after 4 pm will be answered the following business day.   You may also send [place] a message via MyChart. We typically respond to MyChart messages within 1-2 business days.  For prescription refills, please ask your pharmacy to contact our office. Our fax number is [phone].  If you have an urgent issue when the clinic is closed that cannot wait until the next business day, you can page your doctor at the number below.    Please note that while we do our best to be available for urgent issues outside of office hours, we are not available 24/7.   If you have an urgent issue and are unable to reach [place], you may choose to seek medical care at your doctor's office, retail clinic, urgent care center, or emergency room.  If you have a medical emergency, please immediately call 911 or go to the emergency department.  Pager Numbers  - Dr. Gwen Pounds: [phone]  - Dr. Roseanne Reno: [phone]  - Dr. Katrinka Blazing: [phone]   In the  event of inclement weather, please call our main line at [phone] for an update on the status of any delays or closures.  Dermatology Medication Tips: Please keep the boxes that topical medications come in in order to help keep track of the instructions about where and how to use these. Pharmacies typically print the medication instructions only on the boxes and not directly on  the medication tubes.   If your medication is too expensive, please contact our office at [phone] option 4 or send [place] a message through MyChart.   We are unable to tell what your co-pay for medications will be in advance as this is different depending on your insurance coverage. However, we may be able to find a substitute medication at lower cost or fill out paperwork to get insurance to cover a needed medication.   If a prior authorization is required to get your medication covered by your insurance company, please allow [place] 1-2 business days to complete this process.  Drug prices often vary depending on where the prescription is filled and some pharmacies may offer cheaper prices.  The website www.goodrx.com contains coupons for medications through different pharmacies. The prices here do not account for what the cost may be with help from insurance (it may be cheaper with your insurance), but the website can give you the price if you did not use any insurance.  - You can print the associated coupon and take it with your prescription to the pharmacy.  - You may also stop by our office during regular business hours and pick up a GoodRx coupon card.  - If you need your prescription sent electronically to a different pharmacy, notify our office through [facility] or by phone at [phone] option 4.     Si Usted Necesita Algo Despus de Su Visita  Tambin puede enviarnos un mensaje a travs de Clinical cytogeneticist. Por lo general respondemos a los mensajes de MyChart en el transcurso de 1 a 2 das hbiles.  Para renovar recetas, por favor pida a su farmacia que se ponga en contacto con nuestra oficina. Annie Sable de fax es [place] [phone].  Si tiene un asunto urgente cuando la clnica est cerrada y que no puede esperar hasta el siguiente da hbil, puede llamar/localizar a su doctor(a) al nmero que aparece a continuacin.   Por favor, tenga en cuenta que aunque hacemos todo lo  posible para estar disponibles para asuntos urgentes fuera del horario de [place], no estamos disponibles las 24 horas del da, los 7 [address]  [address] de la [place].   Si tiene un problema urgente y no puede comunicarse con nosotros, puede optar por buscar atencin mdica  en el consultorio de su doctor(a), en una clnica privada, en un centro de atencin urgente o en una sala de emergencias.  Si tiene Engineer, drilling, por favor llame inmediatamente al 911 o vaya a la sala de emergencias.  Nmeros de bper  - Dr. Gwen Pounds: [phone]  - Dra. Roseanne Reno: [phone]  - Dr. Katrinka Blazing: [phone]   En caso de inclemencias del tiempo, por favor llame a Lacy Duverney principal al [phone] para una actualizacin sobre el [place] de cualquier retraso o cierre.  Consejos para la medicacin en dermatologa: Por favor, guarde las cajas en las que vienen los medicamentos de uso tpico para ayudarle a seguir las instrucciones sobre dnde y cmo usarlos. Las farmacias generalmente imprimen las instrucciones del medicamento slo en las cajas y no directamente en los tubos del [place].   Si  su medicamento es muy caro, por favor, pngase en contacto con Rolm Gala llamando al [phone] y presione la opcin 4 o envenos un mensaje a travs de Clinical cytogeneticist.   No podemos decirle cul ser su copago por los medicamentos por adelantado ya que esto es diferente dependiendo de la cobertura de su seguro. Sin embargo, es posible que podamos encontrar un medicamento sustituto a Audiological scientist un formulario para que el seguro cubra el medicamento que se considera necesario.   Si se requiere una autorizacin previa para que su compaa de seguros [place] su medicamento, por favor permtanos de 1 a 2 das hbiles para completar [address]  Los precios de los medicamentos varan con frecuencia dependiendo del Environmental consultant de dnde se surte la receta y alguna farmacias pueden ofrecer precios ms baratos.  El sitio web  www.goodrx.com tiene cupones para medicamentos de Health and safety inspector. Los precios aqu no tienen en cuenta lo que podra costar con la ayuda del seguro (puede ser ms barato con su seguro), pero el sitio web puede darle el precio si no utiliz Tourist information centre manager.  - Puede imprimir el cupn correspondiente y llevarlo con su receta a la farmacia.  - Tambin puede pasar por nuestra oficina durante el horario de atencin regular y Education officer, museum una tarjeta de cupones de GoodRx.  - Si necesita que su receta se enve electrnicamente a una farmacia diferente, informe a nuestra oficina a travs de MyChart de [place] o por telfono llamando al [phone] y presione la opcin 4.

## 2023-07-14 ENCOUNTER — Ambulatory Visit (INDEPENDENT_AMBULATORY_CARE_PROVIDER_SITE_OTHER): Payer: 59 | Admitting: Internal Medicine

## 2023-07-14 ENCOUNTER — Encounter: Payer: Self-pay | Admitting: Internal Medicine

## 2023-07-14 VITALS — BP 124/72 | Ht 60.0 in | Wt 190.6 lb

## 2023-07-14 DIAGNOSIS — L700 Acne vulgaris: Secondary | ICD-10-CM

## 2023-07-14 DIAGNOSIS — Z5181 Encounter for therapeutic drug level monitoring: Secondary | ICD-10-CM | POA: Diagnosis not present

## 2023-07-14 DIAGNOSIS — L732 Hidradenitis suppurativa: Secondary | ICD-10-CM | POA: Diagnosis not present

## 2023-07-14 NOTE — Patient Instructions (Signed)
Hidradenitis Suppurativa Hidradenitis suppurativa is a long-term (chronic) skin disease. It is similar to a severe form of acne, but it affects areas of the body where acne would be unusual, especially areas of the body where skin rubs against skin and becomes moist. These include: Underarms. Groin. Genital area. Buttocks. Upper thighs. Breasts. Hidradenitis suppurativa may start out as small lumps or pimples caused by blocked skin pores, sweat glands, or hair follicles. Pimples may develop into deep sores that break open (rupture) and drain pus. Over time, affected areas of skin may thicken and become scarred. This condition is rare and does not spread from person to person (non-contagious). What are the causes? The exact cause of this condition is not known. It may be related to: Female and female hormones. An overactive disease-fighting system (immune system). The immune system may over-react to blocked hair follicles or sweat glands and cause swelling and pus-filled sores. What increases the risk? You are more likely to develop this condition if you: Are female. Are 11-55 years old. Have a family history of hidradenitis suppurativa. Have a personal history of acne. Are overweight. Smoke. Take the medicine lithium. What are the signs or symptoms? The first symptoms are usually painful bumps in the skin, similar to pimples. The condition may get worse over time (progress), or it may only cause mild symptoms. If the disease progresses, symptoms may include: Skin bumps getting bigger and growing deeper into the skin. Bumps rupturing and draining pus. Itchy, infected skin. Skin getting thicker and scarred. Tunnels under the skin (fistulas) where pus drains from a bump. Pain during daily activities, such as pain during walking if your groin area is affected. Emotional problems, such as stress or depression. This condition may affect your appearance and your ability or willingness to wear  certain clothes or do certain activities. How is this diagnosed? This condition is diagnosed by a health care provider who specializes in skin conditions (dermatologist). You may be diagnosed based on: Your symptoms and medical history. A physical exam. Testing a pus sample for infection. Blood tests. How is this treated? Your treatment will depend on how severe your symptoms are. The same treatment will not work for everybody with this condition. You may need to try several treatments to find what works best for you. Treatment may include: Cleaning and bandaging (dressing) your wounds as needed. Lifestyle changes, such as new skin care routines. Taking medicines, such as: Antibiotics. Acne medicines. Medicines to reduce the activity of the immune system. A diabetes medicine (metformin). Birth control pills, for women. Steroids to reduce swelling and pain. Working with a mental health care provider, if you experience emotional distress due to this condition. If you have severe symptoms that do not get better with medicine, you may need surgery. Surgery may involve: Using a laser to clear the skin and remove hair follicles. Opening and draining deep sores. Removing the areas of skin that are diseased and scarred. Follow these instructions at home: Medicines  Take over-the-counter and prescription medicines only as told by your health care provider. If you were prescribed antibiotics, take them as told by your health care provider. Do not stop using the antibiotic even if your condition improves. Skin care If you have open wounds, cover them with a clean dressing as told by your health care provider. Keep wounds clean by washing them gently with soap and water when you bathe. Do not shave the areas where you get hidradenitis suppurativa. Wear loose-fitting clothes. Try to avoid   getting overheated or sweaty. If you get sweaty or wet, change into clean, dry clothes as soon as you can. To  help relieve pain and itchiness, cover sore areas with a warm, clean washcloth (warm compress) for 5-10 minutes as often as needed. Your healthcare provider may recommend an antiperspirant deodorant that may be gentle on your skin. A daily antiseptic wash to cleanse affected areas may be suggested by your healthcare provider. General instructions Learn as much as you can about your disease so that you have an active role in your treatment. Work closely with your health care provider to find treatments that work for you. If you are overweight, work with your health care provider to lose weight as recommended. Do not use any products that contain nicotine or tobacco. These products include cigarettes, chewing tobacco, and vaping devices, such as e-cigarettes. If you need help quitting, ask your health care provider. If you struggle with living with this condition, talk with your health care provider or work with a mental health care provider as recommended. Keep all follow-up visits. Where to find more information Hidradenitis Suppurativa Foundation, Inc.: www.hs-foundation.org American Academy of Dermatology: www.aad.org Contact a health care provider if: You have a flare-up of hidradenitis suppurativa. You have a fever or chills. You have trouble controlling your symptoms at home. You have trouble doing your daily activities because of your symptoms. You have trouble dealing with emotional problems related to your condition. Summary Hidradenitis suppurativa is a long-term (chronic) skin disease. It is similar to a severe form of acne, but it affects areas of the body where acne would be unusual. The first symptoms are usually painful bumps in the skin, similar to pimples. The condition may only cause mild symptoms, or it may get worse over time (progress). If you have open wounds, cover them with a clean dressing as told by your health care provider. Keep wounds clean by washing them gently with  soap and water when you bathe. Besides skin care, treatment may include medicines, laser treatment, and surgery. This information is not intended to replace advice given to you by your health care provider. Make sure you discuss any questions you have with your health care provider. Document Revised: 07/29/2021 Document Reviewed: 07/29/2021 Elsevier Patient Education  2024 [employer].  

## 2023-07-14 NOTE — Progress Notes (Signed)
Subjective:    Patient ID: Ellen Kaiser, female    DOB: 10-Jul-1990, 33 y.o.   MRN: [id number]  HPI  Discussed the use of AI scribe software for clinical note transcription with the patient, who gave verbal consent to proceed.  The patient, diagnosed with acne and suspected hidradenitis suppurativa, was referred to a dermatologist for further evaluation of facial lesions. This workup includes a metabolic panel, CBC with differential, hepatitis B surface antigen, hepatitis B surface antibody, hep C antibody, HIV antibody, hep B core antibody, and a quantiferon TB Gold plus. The patient had initially planned to have these labs done at [facility], but due to inclement weather, decided to have her done at the primary care provider's office.       Review of Systems   Past Medical History:  Diagnosis Date   Allergy    Anxiety    Asthma    Bipolar 1 disorder (HCC)    BV (bacterial vaginosis)    Depression    HSV-2 seropositive     Current Outpatient Medications  Medication Sig Dispense Refill   ABILIFY MAINTENA 400 MG PRSY prefilled syringe Inject into the muscle.     albuterol (VENTOLIN HFA) 108 (90 Base) MCG/ACT inhaler INHALE 1 PUFF INTO THE LUNGS EVERY 6 HOURS AS NEEDED FOR WHEEZING OR SHORTNESS OF BREATH. 8.5 each 0   ARISTADA 882 MG/3.2ML prefilled syringe SMARTSIG:3.2 Milliliter(s) IM Once a Month     busPIRone (BUSPAR) 10 MG tablet TAKE 1 TABLET BY MOUTH 2 TO 3 TIMES DIALY AS NEEDED FOR ANXIETY     clonazePAM (KLONOPIN) 1 MG tablet Take 1 tablet by mouth 2 (two) times daily.     doxycycline (MONODOX) 100 MG capsule Take 1 capsule (100 mg total) by mouth 2 (two) times daily. Take with food 60 capsule 2   doxycycline (VIBRA-TABS) 100 MG tablet Take 1 tablet (100 mg total) by mouth 2 (two) times daily. 14 tablet 0   fluconazole (DIFLUCAN) 150 MG tablet Take 1 tablet (150 mg total) by mouth once as needed for up to 1 dose (vaginal yeast infection - if not resolved in 72 hours  take second tablet). 2 tablet 0   hydrOXYzine (ATARAX/VISTARIL) 50 MG tablet Take 100 mg by mouth daily.      ondansetron (ZOFRAN-ODT) 4 MG disintegrating tablet Take 1 tablet (4 mg total) by mouth every 8 (eight) hours as needed for nausea or vomiting. 20 tablet 0   spironolactone (ALDACTONE) 50 MG tablet Take 1 tablet (50 mg total) by mouth daily. 7 tablet 0   tretinoin (RETIN-A) 0.025 % cream Apply topically at bedtime. As directed. 45 g 2   No current facility-administered medications for this visit.    Allergies  Allergen Reactions   Fish Allergy Anaphylaxis and Swelling    Family History  Problem Relation Age of Onset   Depression Mother    Alcohol abuse Mother    Bipolar disorder Mother    Healthy Father    Healthy Sister    Osteoarthritis Maternal Grandmother    Dementia Paternal Grandmother     Social History   Socioeconomic History   Marital status: Single    Spouse name: Not on file   Number of children: Not on file   Years of education: college   Highest education level: Not on file  Occupational History   Occupation: disabled    Comment: PTSD  Tobacco Use   Smoking status: Every Day    Current packs/day: 0.50  Average packs/day: 0.5 packs/day for 10.0 years (5.0 ttl pk-yrs)    Types: Cigarettes   Smokeless tobacco: Never  Vaping Use   Vaping status: Former   Substances: THC  Substance and Sexual Activity   Alcohol use: Yes    Alcohol/week: 3.0 standard drinks of alcohol    Types: 3 Cans of beer per week    Comment: 3 beers weekly   Drug use: Yes    Types: Marijuana   Sexual activity: Yes    Partners: Male    Birth control/protection: Pill    Comment: Mirena  Other Topics Concern   Not on file  Social History Narrative   Not on file   Social Drivers of Health   Financial Resource Strain: Low Risk  (09/10/2022)   Overall Financial Resource Strain (CARDIA)    Difficulty of Paying Living Expenses: Not very hard  Food Insecurity: No Food  Insecurity (09/10/2022)   Hunger Vital Sign    Worried About Running Out of Food in the Last Year: Never true    Ran Out of Food in the Last Year: Never true  Transportation Needs: No Transportation Needs (09/10/2022)   PRAPARE - Administrator, Civil Service (Medical): No    Lack of Transportation (Non-Medical): No  Physical Activity: Insufficiently Active (09/10/2022)   Exercise Vital Sign    Days of Exercise per Week: 2 days    Minutes of Exercise per Session: 30 min  Stress: No Stress Concern Present (09/10/2022)   [employer] of Occupational Health - Occupational Stress Questionnaire    Feeling of Stress : Only a little  Social Connections: Socially Isolated (09/10/2022)   Social Connection and Isolation Panel [NHANES]    Frequency of Communication with Friends and Family: More than three times a week    Frequency of Social Gatherings with Friends and Family: Once a week    Attends Religious Services: Never    Database administrator or Organizations: No    Attends Banker Meetings: Never    Marital Status: Never married  Intimate Partner Violence: Not At Risk (09/10/2022)   Humiliation, Afraid, Rape, and Kick questionnaire    Fear of Current or Ex-Partner: No    Emotionally Abused: No    Physically Abused: No    Sexually Abused: No     Constitutional: Patient reports intermittent headaches.  Denies fever, malaise, fatigue, or abrupt weight changes.  HEENT: Denies eye pain, eye redness, ear pain, ringing in the ears, wax buildup, runny nose, nasal congestion, bloody nose, or sore throat. Respiratory: Denies difficulty breathing, shortness of breath, cough or sputum production.   Cardiovascular: Denies chest pain, chest tightness, palpitations or swelling in the hands or feet.  Gastrointestinal: Denies abdominal pain, bloating, constipation, diarrhea or blood in the stool.  GU: Denies urgency, frequency, pain with urination, burning sensation, blood in  urine, odor or discharge. Musculoskeletal: Denies decrease in range of motion, difficulty with gait, muscle pain or joint pain and swelling.  Skin: Pt reports facial lesions. Denies redness, rashes, ulcercations.  Neurological: Denies dizziness, difficulty with memory, difficulty with speech or problems with balance and coordination.  Psych: Patient has a history of anxiety and depression.  Denies SI/HI.  No other specific complaints in a complete review of systems (except as listed in HPI above).      Objective:   Physical Exam  BP 124/72 (BP Location: Right Arm, Patient Position: Sitting, Cuff Size: Normal)   Ht 5' (1.524 m)  Wt 190 lb 9.6 oz (86.5 kg)   LMP 05/28/2023 (Exact Date)   BMI 37.22 kg/m   Wt Readings from Last 3 Encounters:  06/27/23 185 lb 9.6 oz (84.2 kg)  06/10/23 184 lb (83.5 kg)  06/04/23 181 lb (82.1 kg)    General: Appears her stated age, obese, in NAD. Skin: Warm, dry and intact. Acne noted of forehead. 1 cm papular lesion noted to the right of the nasal bridge. HEENT: Head: normal shape and size; Eyes: sclera white, no icterus, conjunctiva pink, PERRLA and EOMs intact;  Cardiovascular: Normal rate. Pulmonary/Chest: Normal effort and positive vesicular breath sounds.  Neurological: Alert and oriented.   BMET    Component Value Date/Time   NA 134 (L) 06/04/2023 0321   K 4.1 06/04/2023 0321   CL 104 06/04/2023 0321   CO2 23 06/04/2023 0321   GLUCOSE 100 (H) 06/04/2023 0321   BUN 9 06/04/2023 0321   CREATININE 0.85 06/04/2023 0321   CREATININE 0.96 10/25/2022 0937   CALCIUM 8.8 (L) 06/04/2023 0321   GFRNONAA >60 06/04/2023 0321   GFRNONAA 81 05/26/2020 0947   GFRAA 94 05/26/2020 0947    Lipid Panel     Component Value Date/Time   CHOL 155 10/25/2022 0937   TRIG 94 10/25/2022 0937   HDL 93 10/25/2022 0937   CHOLHDL 1.7 10/25/2022 0937   LDLCALC 44 10/25/2022 0937    CBC    Component Value Date/Time   WBC 8.0 06/04/2023 0321   RBC 4.41  06/04/2023 0321   HGB 14.5 06/04/2023 0321   HCT 41.9 06/04/2023 0321   PLT 298 06/04/2023 0321   MCV 95.0 06/04/2023 0321   MCH 32.9 06/04/2023 0321   MCHC 34.6 06/04/2023 0321   RDW 14.1 06/04/2023 0321   LYMPHSABS 2.1 06/04/2023 0321   MONOABS 0.6 06/04/2023 0321   EOSABS 0.0 06/04/2023 0321   BASOSABS 0.0 06/04/2023 0321    Hgb A1C Lab Results  Component Value Date   HGBA1C 5.4 10/25/2022            Assessment & Plan:   Assessment and Plan    Facial Acne, Hidradenitis Supparativa Dermatology consultation completed. Plan to start Cosentyx pending lab results. -Order labs as requested by dermatologist:metabolic panel, CBC with differential, hepatitis B surface antigen, hepatitis B surface antibody, hep C antibody, HIV antibody, hep B core antibody, and quantiferon TB Gold plus. -Inform patient to expect lab results by Monday and to share results with dermatologist via MyChart. -Follow up with dermatology in 1 month or sooner depending on lab results.       RTC in 4 months your annual exam Nicki Reaper, NP

## 2023-07-15 ENCOUNTER — Encounter: Payer: Self-pay | Admitting: Internal Medicine

## 2023-07-16 LAB — COMPLETE METABOLIC PANEL WITH GFR
AG Ratio: 1.7 (calc) (ref 1.0–2.5)
ALT: 18 U/L (ref 6–29)
AST: 18 U/L (ref 10–30)
Albumin: 4.6 g/dL (ref 3.6–5.1)
Alkaline phosphatase (APISO): 56 U/L (ref 31–125)
BUN/Creatinine Ratio: 9 (calc) (ref 6–22)
BUN: 9 mg/dL (ref 7–25)
CO2: 23 mmol/L (ref 20–32)
Calcium: 10.1 mg/dL (ref 8.6–10.2)
Chloride: 107 mmol/L (ref 98–110)
Creat: 1.05 mg/dL — ABNORMAL HIGH (ref 0.50–0.97)
Globulin: 2.7 g/dL (ref 1.9–3.7)
Glucose, Bld: 92 mg/dL (ref 65–99)
Potassium: 4.2 mmol/L (ref 3.5–5.3)
Sodium: 139 mmol/L (ref 135–146)
Total Bilirubin: 0.6 mg/dL (ref 0.2–1.2)
Total Protein: 7.3 g/dL (ref 6.1–8.1)
eGFR: 72 mL/min/{1.73_m2} (ref >=60)

## 2023-07-16 LAB — CBC WITH DIFFERENTIAL/PLATELET
Absolute Lymphocytes: 2174 {cells}/uL (ref 850–3900)
Absolute Monocytes: 490 {cells}/uL (ref 200–950)
Basophils Absolute: 43 {cells}/uL (ref 0–200)
Basophils Relative: 0.6 %
Eosinophils Absolute: 22 {cells}/uL (ref 15–500)
Eosinophils Relative: 0.3 %
HCT: 41.9 % (ref 35.0–45.0)
Hemoglobin: 14.1 g/dL (ref 11.7–15.5)
MCH: 33.2 pg — ABNORMAL HIGH (ref 27.0–33.0)
MCHC: 33.7 g/dL (ref 32.0–36.0)
MCV: 98.6 fL (ref 80.0–100.0)
MPV: 10.8 fL (ref 7.5–12.5)
Monocytes Relative: 6.8 %
Neutro Abs: 4471 {cells}/uL (ref 1500–7800)
Neutrophils Relative %: 62.1 %
Platelets: 288 10*3/uL (ref 140–400)
RBC: 4.25 10*6/uL (ref 3.80–5.10)
RDW: 12.9 % (ref 11.0–15.0)
Total Lymphocyte: 30.2 %
WBC: 7.2 10*3/uL (ref 3.8–10.8)

## 2023-07-16 LAB — HEPATITIS C ANTIBODY: Hepatitis C Ab: NONREACTIVE

## 2023-07-16 LAB — HEPATITIS B CORE ANTIBODY, TOTAL: Hep B Core Total Ab: NONREACTIVE

## 2023-07-16 LAB — HEPATITIS B SURFACE ANTIGEN: Hepatitis B Surface Ag: NONREACTIVE

## 2023-07-16 LAB — HIV ANTIBODY (ROUTINE TESTING W REFLEX): HIV 1&2 Ab, 4th Generation: NONREACTIVE

## 2023-07-16 LAB — QUANTIFERON-TB GOLD PLUS
Mitogen-NIL: 7.86 [IU]/mL
NIL: 0.04 [IU]/mL
QuantiFERON-TB Gold Plus: NEGATIVE
TB1-NIL: 0.01 [IU]/mL
TB2-NIL: 0 [IU]/mL

## 2023-07-16 LAB — HEPATITIS B SURFACE ANTIBODY,QUALITATIVE: Hep B S Ab: REACTIVE — AB

## 2023-07-18 ENCOUNTER — Telehealth: Payer: Self-pay

## 2023-07-18 DIAGNOSIS — L732 Hidradenitis suppurativa: Secondary | ICD-10-CM

## 2023-07-18 NOTE — Telephone Encounter (Signed)
Screening labs in patient's chart. She used PCP since their office was convenient for her. Please advise.

## 2023-07-19 ENCOUNTER — Encounter: Payer: Self-pay | Admitting: Dermatology

## 2023-07-19 MED ORDER — COSENTYX SENSOREADY (300 MG) 150 MG/ML ~~LOC~~ SOAJ: 300.0000 mg | SUBCUTANEOUS | 0 refills | Status: DC

## 2023-07-19 MED ORDER — COSENTYX SENSOREADY (300 MG) 150 MG/ML ~~LOC~~ SOAJ: 300.0000 mg | SUBCUTANEOUS | 5 refills | Status: DC

## 2023-07-19 NOTE — Telephone Encounter (Signed)
Discussed lab results with patient and prescriptions sent to [facility].

## 2023-07-21 ENCOUNTER — Telehealth: Payer: Self-pay

## 2023-07-21 NOTE — Telephone Encounter (Signed)
[facility] Pharmacist called concerning an allergy patient has with Latex. Requesting to use alternative to what patient was already prescribed. They said that patient could use the Cosentyx 300 mg uno ready pen which contains no latex.    Pharmacy states they can be reached at [phone]   Please advise

## 2023-07-25 MED ORDER — COSENTYX UNOREADY 300 MG/2ML ~~LOC~~ SOAJ: 300.0000 mg | SUBCUTANEOUS | 0 refills | Status: DC

## 2023-07-25 MED ORDER — COSENTYX UNOREADY 300 MG/2ML ~~LOC~~ SOAJ: 300.0000 mg | SUBCUTANEOUS | 4 refills | Status: DC

## 2023-07-25 NOTE — Telephone Encounter (Signed)
RX change made. aw

## 2023-07-28 ENCOUNTER — Encounter: Payer: Self-pay | Admitting: Internal Medicine

## 2023-07-28 ENCOUNTER — Ambulatory Visit: Payer: 59 | Admitting: Internal Medicine

## 2023-07-28 VITALS — BP 112/78 | Ht 60.0 in | Wt 191.4 lb

## 2023-07-28 DIAGNOSIS — R202 Paresthesia of skin: Secondary | ICD-10-CM | POA: Diagnosis not present

## 2023-07-28 DIAGNOSIS — M79601 Pain in right arm: Secondary | ICD-10-CM | POA: Diagnosis not present

## 2023-07-28 DIAGNOSIS — M542 Cervicalgia: Secondary | ICD-10-CM

## 2023-07-28 DIAGNOSIS — M79602 Pain in left arm: Secondary | ICD-10-CM

## 2023-07-28 MED ORDER — PREDNISONE 10 MG PO TABS: ORAL_TABLET | ORAL | 0 refills | Status: DC

## 2023-07-28 MED ORDER — METHOCARBAMOL 500 MG PO TABS: 500.0000 mg | ORAL_TABLET | Freq: Three times a day (TID) | ORAL | 0 refills | Status: DC | PRN

## 2023-07-28 NOTE — Progress Notes (Signed)
 Subjective:    Patient ID: Ellen Kaiser, female    DOB: 12/22/1990, 33 y.o.   MRN: [id number]  HPI  Discussed the use of AI scribe software for clinical note transcription with the patient, who gave verbal consent to proceed.  Ellen Kaiser is a 33 year old female who presents with neck pain and numbness in hands and arms.  She has been experiencing neck pain for the past two days, which she attributes to sleeping with too many pillows. There is a decrease in range of motion, particularly when bending her neck to the left. No headaches or dizziness are present. She has not had any prior neck injuries and has been taking Aleve  for the pain.  She describes numbness and tingling in her hands and arms, occurring for a few months but consistent over the past two weeks. The numbness is primarily at night and improves throughout the day, affecting both sides equally and extending up to the elbows. She describes soreness in her forearms, similar to the feeling after using arm weights. No history of carpal tunnel syndrome or surgery, but she mentions having tendonitis and arthritis.      Review of Systems   Past Medical History:  Diagnosis Date   Allergy    Anxiety    Asthma    Bipolar 1 disorder (HCC)    BV (bacterial vaginosis)    Depression    HSV-2 seropositive     Current Outpatient Medications  Medication Sig Dispense Refill   ABILIFY MAINTENA 400 MG PRSY prefilled syringe Inject into the muscle.     albuterol  (VENTOLIN  HFA) 108 (90 Base) MCG/ACT inhaler INHALE 1 PUFF INTO THE LUNGS EVERY 6 HOURS AS NEEDED FOR WHEEZING OR SHORTNESS OF BREATH. 8.5 each 0   ARISTADA 882 MG/3.2ML prefilled syringe SMARTSIG:3.2 Milliliter(s) IM Once a Month     busPIRone (BUSPAR) 10 MG tablet TAKE 1 TABLET BY MOUTH 2 TO 3 TIMES DIALY AS NEEDED FOR ANXIETY     clonazePAM (KLONOPIN) 1 MG tablet Take 1 tablet by mouth 2 (two) times daily.     doxycycline  (MONODOX ) 100 MG capsule  Take 1 capsule (100 mg total) by mouth 2 (two) times daily. Take with food (Patient not taking: Reported on 07/14/2023) 60 capsule 2   fluconazole  (DIFLUCAN ) 150 MG tablet Take 1 tablet (150 mg total) by mouth once as needed for up to 1 dose (vaginal yeast infection - if not resolved in 72 hours take second tablet). 2 tablet 0   hydrOXYzine (ATARAX/VISTARIL) 50 MG tablet Take 100 mg by mouth daily.      ondansetron  (ZOFRAN -ODT) 4 MG disintegrating tablet Take 1 tablet (4 mg total) by mouth every 8 (eight) hours as needed for nausea or vomiting. 20 tablet 0   Secukinumab  (COSENTYX  UNOREADY) 300 MG/2ML SOAJ Inject 300 mg into the skin as directed. Inject 2 mLs (300 mg total) into the skin as directed. On week 0, 1, 2, 3 and 4. 10 mL 0   Secukinumab  (COSENTYX  UNOREADY) 300 MG/2ML SOAJ Inject 300 mg into the skin every 28 (twenty-eight) days. 2 mL 4   Secukinumab , 300 MG Dose, (COSENTYX  SENSOREADY, 300 MG,) 150 MG/ML SOAJ Inject 2 mLs (300 mg total) into the skin as directed. On week 0, 1, 2, 3 and 4. 10 mL 0   Secukinumab , 300 MG Dose, (COSENTYX  SENSOREADY, 300 MG,) 150 MG/ML SOAJ Inject 2 mLs (300 mg total) into the skin every 28 (twenty-eight) days. For maintenance. 2  mL 5   spironolactone  (ALDACTONE ) 50 MG tablet Take 1 tablet (50 mg total) by mouth daily. 7 tablet 0   tretinoin  (RETIN-A ) 0.025 % cream Apply topically at bedtime. As directed. 45 g 2   No current facility-administered medications for this visit.    Allergies  Allergen Reactions   Fish Allergy Anaphylaxis and Swelling    Family History  Problem Relation Age of Onset   Depression Mother    Alcohol abuse Mother    Bipolar disorder Mother    Healthy Father    Healthy Sister    Osteoarthritis Maternal Grandmother    Dementia Paternal Grandmother     Social History   Socioeconomic History   Marital status: Single    Spouse name: Not on file   Number of children: Not on file   Years of education: college   Highest  education level: Not on file  Occupational History   Occupation: disabled    Comment: PTSD  Tobacco Use   Smoking status: Every Day    Current packs/day: 0.50    Average packs/day: 0.5 packs/day for 10.0 years (5.0 ttl pk-yrs)    Types: Cigarettes   Smokeless tobacco: Never  Vaping Use   Vaping status: Former   Substances: THC  Substance and Sexual Activity   Alcohol use: Yes    Alcohol/week: 3.0 standard drinks of alcohol    Types: 3 Cans of beer per week    Comment: 3 beers weekly   Drug use: Yes    Types: Marijuana   Sexual activity: Yes    Partners: Male    Birth control/protection: Pill    Comment: Mirena  Other Topics Concern   Not on file  Social History Narrative   Not on file   Social Drivers of Health   Financial Resource Strain: Low Risk  (09/10/2022)   Overall Financial Resource Strain (CARDIA)    Difficulty of Paying Living Expenses: Not very hard  Food Insecurity: No Food Insecurity (09/10/2022)   Hunger Vital Sign    Worried About Running Out of Food in the Last Year: Never true    Ran Out of Food in the Last Year: Never true  Transportation Needs: No Transportation Needs (09/10/2022)   PRAPARE - Administrator, Civil Service (Medical): No    Lack of Transportation (Non-Medical): No  Physical Activity: Insufficiently Active (09/10/2022)   Exercise Vital Sign    Days of Exercise per Week: 2 days    Minutes of Exercise per Session: 30 min  Stress: No Stress Concern Present (09/10/2022)   [employer] of Occupational Health - Occupational Stress Questionnaire    Feeling of Stress : Only a little  Social Connections: Socially Isolated (09/10/2022)   Social Connection and Isolation Panel [NHANES]    Frequency of Communication with Friends and Family: More than three times a week    Frequency of Social Gatherings with Friends and Family: Once a week    Attends Religious Services: Never    Database Administrator or Organizations: No    Attends  Banker Meetings: Never    Marital Status: Never married  Intimate Partner Violence: Not At Risk (09/10/2022)   Humiliation, Afraid, Rape, and Kick questionnaire    Fear of Current or Ex-Partner: No    Emotionally Abused: No    Physically Abused: No    Sexually Abused: No     Constitutional: Denies fever, malaise, fatigue, headache or abrupt weight changes.  Respiratory: Denies  difficulty breathing, shortness of breath, cough or sputum production.   Cardiovascular: Denies chest pain, chest tightness, palpitations or swelling in the hands or feet.  Musculoskeletal: Patient reports right side neck pain and decreased range of motion.  Denies difficulty with gait, muscle pain or joint swelling.  Skin: Denies redness, rashes, lesions or ulcercations.  Neurological: Patient reports paresthesia of from elbows to fingertips.  Denies dizziness, difficulty with memory, difficulty with speech or problems with balance and coordination.    No other specific complaints in a complete review of systems (except as listed in HPI above).      Objective:   Physical Exam  BP 112/78 (BP Location: Right Arm, Patient Position: Sitting, Cuff Size: Normal)   Ht 5' (1.524 m)   Wt 191 lb 6.4 oz (86.8 kg)   LMP 06/16/2023 (Exact Date)   BMI 37.38 kg/m   Wt Readings from Last 3 Encounters:  07/14/23 190 lb 9.6 oz (86.5 kg)  06/27/23 185 lb 9.6 oz (84.2 kg)  06/10/23 184 lb (83.5 kg)    General: Appears her stated age, obese, in NAD. Skin: Warm, dry and intact. No rashes, lesions or ulcerations noted. HEENT: Head: normal shape and size; Eyes: sclera white, no icterus, conjunctiva pink, PERRLA and EOMs intact; Ears:  Cardiovascular: Normal rate and rhythm. Pulmonary/Chest: Normal effort and positive vesicular breath sounds. No respiratory distress.  Musculoskeletal: Normal flexion, extension, rotation and lateral bending of the cervical spine but pain with rotation to the left.  No bony  tenderness noted over the cervical spine.  No pain with back patient and bilateral paracervical muscles.  Normal flexion, extension and rotation of the elbows.  Normal flexion, extension and rotation of the wrist.  No joint swelling noted.  Strength 5/5 BUE.  Handgrips equal.  No difficulty with gait.  Neurological: Alert and oriented.  Negative Phalen's bilaterally.  Negative Tinel's bilaterally.  Coordination normal.    BMET    Component Value Date/Time   NA 139 07/14/2023 0905   K 4.2 07/14/2023 0905   CL 107 07/14/2023 0905   CO2 23 07/14/2023 0905   GLUCOSE 92 07/14/2023 0905   BUN 9 07/14/2023 0905   CREATININE 1.05 (H) 07/14/2023 0905   CALCIUM 10.1 07/14/2023 0905   GFRNONAA >60 06/04/2023 0321   GFRNONAA 81 05/26/2020 0947   GFRAA 94 05/26/2020 0947    Lipid Panel     Component Value Date/Time   CHOL 155 10/25/2022 0937   TRIG 94 10/25/2022 0937   HDL 93 10/25/2022 0937   CHOLHDL 1.7 10/25/2022 0937   LDLCALC 44 10/25/2022 0937    CBC    Component Value Date/Time   WBC 7.2 07/14/2023 0905   RBC 4.25 07/14/2023 0905   HGB 14.1 07/14/2023 0905   HCT 41.9 07/14/2023 0905   PLT 288 07/14/2023 0905   MCV 98.6 07/14/2023 0905   MCH 33.2 (H) 07/14/2023 0905   MCHC 33.7 07/14/2023 0905   RDW 12.9 07/14/2023 0905   LYMPHSABS 2.1 06/04/2023 0321   MONOABS 0.6 06/04/2023 0321   EOSABS 22 07/14/2023 0905   BASOSABS 43 07/14/2023 0905    Hgb A1C Lab Results  Component Value Date   HGBA1C 5.4 10/25/2022            Assessment & Plan:   Assessment and Plan    Neck Pain Acute onset, likely due to poor sleeping position. No associated headaches or dizziness. Limited range of motion to the left. No prior neck injuries. -Continue  over-the-counter analgesics. -Prescribe muscle relaxer, to be taken up to three times a day or at bedtime as needed. -Consider physical therapy for neck if symptoms persist.  Numbness and Tingling in Hands and Forearms Chronic,  bilateral, up to the elbows. No prior history of carpal tunnel syndrome. Possible mild carpal tunnel syndrome or ulnar neuropathy. -Order blood work to check for thyroid and vitamin deficiencies ( B12, D). -Prescribe a 9-day steroid taper. -Consider referral to neurology or orthopedics if symptoms persist after medication course.   RTC in 4 months for annual exam Angeline Laura, NP

## 2023-07-28 NOTE — Patient Instructions (Signed)
 Neck Exercises Ask your health care provider which exercises are safe for you. Do exercises exactly as told by your health care provider and adjust them as directed. It is normal to feel mild stretching, pulling, tightness, or discomfort as you do these exercises. Stop right away if you feel sudden pain or your pain gets worse. Do not begin these exercises until told by your health care provider. Neck exercises can be important for many reasons. They can improve strength and maintain flexibility in your neck, which will help your upper back and prevent neck pain. Stretching exercises Rotation neck stretching  Sit in a chair or stand up. Place your feet flat on the floor, shoulder-width apart. Slowly turn your head (rotate) to the right until a slight stretch is felt. Turn it all the way to the right so you can look over your right shoulder. Do not tilt or tip your head. Hold this position for 10-30 seconds. Slowly turn your head (rotate) to the left until a slight stretch is felt. Turn it all the way to the left so you can look over your left shoulder. Do not tilt or tip your head. Hold this position for 10-30 seconds. Repeat __________ times. Complete this exercise __________ times a day. Neck retraction  Sit in a sturdy chair or stand up. Look straight ahead. Do not bend your neck. Use your fingers to push your chin backward (retraction). Do not bend your neck for this movement. Continue to face straight ahead. If you are doing the exercise properly, you will feel a slight sensation in your throat and a stretch at the back of your neck. Hold the stretch for 1-2 seconds. Repeat __________ times. Complete this exercise __________ times a day. Strengthening exercises Neck press  Lie on your back on a firm bed or on the floor with a pillow under your head. Use your neck muscles to push your head down on the pillow and straighten your spine. Hold the position as well as you can. Keep your head  facing up (in a neutral position) and your chin tucked. Slowly count to 5 while holding this position. Repeat __________ times. Complete this exercise __________ times a day. Isometrics These are exercises in which you strengthen the muscles in your neck while keeping your neck still (isometrics). Sit in a supportive chair and place your hand on your forehead. Keep your head and face facing straight ahead. Do not flex or extend your neck while doing isometrics. Push forward with your head and neck while pushing back with your hand. Hold for 10 seconds. Do the sequence again, this time putting your hand against the back of your head. Use your head and neck to push backward against the hand pressure. Finally, do the same exercise on either side of your head, pushing sideways against the pressure of your hand. Repeat __________ times. Complete this exercise __________ times a day. Prone head lifts  Lie face-down (prone position), resting on your elbows so that your chest and upper back are raised. Start with your head facing downward, near your chest. Position your chin either on or near your chest. Slowly lift your head upward. Lift until you are looking straight ahead. Then continue lifting your head as far back as you can comfortably stretch. Hold your head up for 5 seconds. Then slowly lower it to your starting position. Repeat __________ times. Complete this exercise __________ times a day. Supine head lifts  Lie on your back (supine position), bending your knees  to point to the ceiling and keeping your feet flat on the floor. Lift your head slowly off the floor, raising your chin toward your chest. Hold for 5 seconds. Repeat __________ times. Complete this exercise __________ times a day. Scapular retraction  Stand with your arms at your sides. Look straight ahead. Slowly pull both shoulders (scapulae) backward and downward (retraction) until you feel a stretch between your shoulder  blades in your upper back. Hold for 10-30 seconds. Relax and repeat. Repeat __________ times. Complete this exercise __________ times a day. Contact a health care provider if: Your neck pain or discomfort gets worse when you do an exercise. Your neck pain or discomfort does not improve within 2 hours after you exercise. If you have any of these problems, stop exercising right away. Do not do the exercises again unless your health care provider says that you can. Get help right away if: You develop sudden, severe neck pain. If this happens, stop exercising right away. Do not do the exercises again unless your health care provider says that you can. This information is not intended to replace advice given to you by your health care provider. Make sure you discuss any questions you have with your health care provider. Document Revised: 12/02/2020 Document Reviewed: 12/02/2020 Elsevier Patient Education  2024 [employer].

## 2023-07-29 ENCOUNTER — Encounter: Payer: Self-pay | Admitting: Internal Medicine

## 2023-07-29 LAB — TSH: TSH: 1.15 m[IU]/L

## 2023-07-29 LAB — VITAMIN D 25 HYDROXY (VIT D DEFICIENCY, FRACTURES): Vit D, 25-Hydroxy: 16 ng/mL — ABNORMAL LOW (ref 30–100)

## 2023-07-29 LAB — VITAMIN B12: Vitamin B-12: 497 pg/mL (ref 200–1100)

## 2023-07-29 MED ORDER — VITAMIN D (ERGOCALCIFEROL) 1.25 MG (50000 UNIT) PO CAPS: 50000.0000 [IU] | ORAL_CAPSULE | ORAL | 0 refills | Status: DC

## 2023-07-29 NOTE — Addendum Note (Signed)
 Addended by: Carollynn Cirri on: 07/29/2023 08:05 AM   Modules accepted: Orders

## 2023-08-08 ENCOUNTER — Telehealth: Payer: Self-pay

## 2023-08-08 ENCOUNTER — Ambulatory Visit (INDEPENDENT_AMBULATORY_CARE_PROVIDER_SITE_OTHER): Payer: 59

## 2023-08-08 DIAGNOSIS — L732 Hidradenitis suppurativa: Secondary | ICD-10-CM | POA: Diagnosis not present

## 2023-08-08 MED ORDER — SECUKINUMAB 300 MG/2ML ~~LOC~~ SOAJ
300.0000 mg | SUBCUTANEOUS | Status: AC
  Administered 2023-08-08 – 2023-08-30 (×4): 300 mg via SUBCUTANEOUS

## 2023-08-08 NOTE — Addendum Note (Signed)
Addended by: Dorathy Daft R on: 08/08/2023 03:34 PM   Modules accepted: Orders

## 2023-08-08 NOTE — Telephone Encounter (Signed)
OK to enter orders for nurse injections of Cosentyx?

## 2023-08-08 NOTE — Telephone Encounter (Signed)
Spoke with patient today in great detail regarding Cosentyx. Patient is upset with the amount of time it has taken her to get medication. She wants to talk about switching, advised patient to switch medications would be to start the process all over again. Patient does not understand why this can not come from a local pharmacy. Instructed patient speciality medications have to come from a specialty pharmacy.  Patient understands.   Will postpone office visit with Dr. Katrinka Blazing from five weeks from today and schedule nurse visit for 2nd loading dose next week. aw

## 2023-08-08 NOTE — Progress Notes (Addendum)
 Patient here today to start Cosentyx injections for Hidradenitis Suppurativa.   Cosentyx uno ready 300mg  injected into left arm. Patient tolerated injection well.   [facility] EXP 03/20/2025  Dorathy Daft, RMA

## 2023-08-11 ENCOUNTER — Ambulatory Visit: Payer: 59

## 2023-08-15 ENCOUNTER — Ambulatory Visit (INDEPENDENT_AMBULATORY_CARE_PROVIDER_SITE_OTHER): Payer: 59

## 2023-08-15 DIAGNOSIS — L732 Hidradenitis suppurativa: Secondary | ICD-10-CM | POA: Diagnosis not present

## 2023-08-15 NOTE — Progress Notes (Addendum)
 Patient here today to for week 2 Cosentyx injections for Hidradenitis Suppurativa.    Cosentyx uno ready 300mg  injected into right arm. Patient tolerated injection well.    [facility]OT [facility] EXP 03/20/2025   Dorathy Daft, RMA

## 2023-08-23 ENCOUNTER — Ambulatory Visit: Payer: 59 | Admitting: Dermatology

## 2023-08-23 ENCOUNTER — Ambulatory Visit (INDEPENDENT_AMBULATORY_CARE_PROVIDER_SITE_OTHER): Payer: 59

## 2023-08-23 DIAGNOSIS — L732 Hidradenitis suppurativa: Secondary | ICD-10-CM

## 2023-08-23 NOTE — Progress Notes (Addendum)
 Patient here today for week 3 of Cosentyx injections for Hidradenitis Suppurativa.    Cosentyx uno ready 300mg  injected into left arm. Patient tolerated injection well.    [facility] EXP 03/20/2025   Dorathy Daft, RMA

## 2023-08-30 ENCOUNTER — Ambulatory Visit (INDEPENDENT_AMBULATORY_CARE_PROVIDER_SITE_OTHER): Payer: 59

## 2023-08-30 DIAGNOSIS — L732 Hidradenitis suppurativa: Secondary | ICD-10-CM

## 2023-08-30 NOTE — Progress Notes (Addendum)
 Patient here today for week 4 of Cosentyx injections for Hidradenitis Suppurativa.    Cosentyx uno ready 300mg  injected into left arm. Patient tolerated injection well.    LOT [facility] EXP 03/20/2025   Dorathy Daft, RMA

## 2023-09-06 ENCOUNTER — Ambulatory Visit (INDEPENDENT_AMBULATORY_CARE_PROVIDER_SITE_OTHER)

## 2023-09-06 ENCOUNTER — Ambulatory Visit: Payer: 59 | Admitting: Dermatology

## 2023-09-06 DIAGNOSIS — L732 Hidradenitis suppurativa: Secondary | ICD-10-CM

## 2023-09-06 NOTE — Progress Notes (Signed)
 Patient here today for final loading dose of Cosentyx injections for Hidradenitis Suppurativa.    Cosentyx uno ready 300mg  injected into right arm. Patient tolerated injection well.    LOT [facility] EXP 03/20/2025   Dorathy Daft, RMA

## 2023-09-07 DIAGNOSIS — L732 Hidradenitis suppurativa: Secondary | ICD-10-CM | POA: Diagnosis not present

## 2023-09-07 MED ORDER — SECUKINUMAB 300 MG/2ML ~~LOC~~ SOAJ
300.0000 mg | Freq: Once | SUBCUTANEOUS | Status: AC
Start: 2023-09-07 — End: 2023-09-07
  Administered 2023-09-07: 300 mg via SUBCUTANEOUS

## 2023-09-07 NOTE — Addendum Note (Signed)
 Addended by: Dorathy Daft R on: 09/07/2023 05:29 PM   Modules accepted: Orders

## 2023-09-07 NOTE — Progress Notes (Signed)
 [facility], This is done. Thank you!

## 2023-09-16 ENCOUNTER — Ambulatory Visit: Payer: 59

## 2023-09-16 DIAGNOSIS — Z0000 Encounter for general adult medical examination without abnormal findings: Secondary | ICD-10-CM

## 2023-09-16 NOTE — Progress Notes (Signed)
 Subjective:   Ellen Kaiser is a 33 y.o. who presents for a Medicare Wellness preventive visit.  Visit Complete: Virtual I connected with  Ellen Kaiser on 09/16/23 by a audio enabled telemedicine application and verified that I am speaking with the correct person using two identifiers.  Patient Location: Home  Provider Location: Office/Clinic  I discussed the limitations of evaluation and management by telemedicine. The patient expressed understanding and agreed to proceed.  Vital Signs: Because this visit was a virtual/telehealth visit, some criteria may be missing or patient reported. Any vitals not documented were not able to be obtained and vitals that have been documented are patient reported.  VideoDeclined- This patient declined Librarian, academic. Therefore the visit was completed with audio only.  Persons Participating in Visit: Patient.  AWV Questionnaire: No: Patient Medicare AWV questionnaire was not completed prior to this visit.  Cardiac Risk Factors include: sedentary lifestyle;obesity (BMI >30kg/m2);smoking/ tobacco exposure     Objective:    There were no vitals filed for this visit. There is no height or weight on file to calculate BMI.     09/16/2023    1:23 PM 06/04/2023    2:06 AM 05/11/2023   10:46 AM 09/20/2022   10:01 AM 09/10/2022    9:24 AM 10/27/2020    8:43 AM 10/27/2020    1:26 AM  Advanced Directives  Does Patient Have a Medical Advance Directive? No No No No No No No  Would patient like information on creating a medical advance directive? No - Patient declined   No - Patient declined No - Patient declined No - Patient declined     Current Medications (verified) Outpatient Encounter Medications as of 09/16/2023  Medication Sig   ABILIFY MAINTENA 400 MG PRSY prefilled syringe Inject into the muscle.   albuterol (VENTOLIN HFA) 108 (90 Base) MCG/ACT inhaler INHALE 1 PUFF INTO THE LUNGS EVERY 6 HOURS AS  NEEDED FOR WHEEZING OR SHORTNESS OF BREATH.   ARISTADA 882 MG/3.2ML prefilled syringe SMARTSIG:3.2 Milliliter(s) IM Once a Month   busPIRone (BUSPAR) 10 MG tablet TAKE 1 TABLET BY MOUTH 2 TO 3 TIMES DIALY AS NEEDED FOR ANXIETY   clonazePAM (KLONOPIN) 1 MG tablet Take 1 tablet by mouth 2 (two) times daily.   hydrOXYzine (ATARAX/VISTARIL) 50 MG tablet Take 100 mg by mouth daily.    methocarbamol (ROBAXIN) 500 MG tablet Take 1 tablet (500 mg total) by mouth every 8 (eight) hours as needed for muscle spasms.   Secukinumab (COSENTYX UNOREADY) 300 MG/2ML SOAJ Inject 300 mg into the skin as directed. Inject 2 mLs (300 mg total) into the skin as directed. On week 0, 1, 2, 3 and 4.   Secukinumab (COSENTYX UNOREADY) 300 MG/2ML SOAJ Inject 300 mg into the skin every 28 (twenty-eight) days.   Secukinumab, 300 MG Dose, (COSENTYX SENSOREADY, 300 MG,) 150 MG/ML SOAJ Inject 2 mLs (300 mg total) into the skin as directed. On week 0, 1, 2, 3 and 4.   Secukinumab, 300 MG Dose, (COSENTYX SENSOREADY, 300 MG,) 150 MG/ML SOAJ Inject 2 mLs (300 mg total) into the skin every 28 (twenty-eight) days. For maintenance.   spironolactone (ALDACTONE) 50 MG tablet Take 1 tablet (50 mg total) by mouth daily.   ondansetron (ZOFRAN-ODT) 4 MG disintegrating tablet Take 1 tablet (4 mg total) by mouth every 8 (eight) hours as needed for nausea or vomiting. (Patient not taking: Reported on 09/16/2023)   predniSONE (DELTASONE) 10 MG tablet Take 3 tabs on  days 1-3, 2 tabs on days 4-6, 1 tab on days 7-9 (Patient not taking: Reported on 09/16/2023)   tretinoin (RETIN-A) 0.025 % cream Apply topically at bedtime. As directed. (Patient not taking: Reported on 09/16/2023)   Vitamin D, Ergocalciferol, (DRISDOL) 1.25 MG (50000 UNIT) CAPS capsule Take 1 capsule (50,000 Units total) by mouth once a week. For 12 weeks. Then start OTC Vitamin D3 2,000 unit daily. (Patient not taking: Reported on 09/16/2023)   No facility-administered encounter medications  on file as of 09/16/2023.    Allergies (verified) Fish allergy   History: Past Medical History:  Diagnosis Date   Allergy    Anxiety    Asthma    Bipolar 1 disorder (HCC)    BV (bacterial vaginosis)    Depression    HSV-2 seropositive    Past Surgical History:  Procedure Laterality Date   WISDOM TOOTH EXTRACTION     Family History  Problem Relation Age of Onset   Depression Mother    Alcohol abuse Mother    Bipolar disorder Mother    Healthy Father    Healthy Sister    Osteoarthritis Maternal Grandmother    Dementia Paternal Grandmother    Social History   Socioeconomic History   Marital status: Single    Spouse name: Not on file   Number of children: Not on file   Years of education: college   Highest education level: Not on file  Occupational History   Occupation: disabled    Comment: PTSD  Tobacco Use   Smoking status: Every Day    Current packs/day: 0.50    Average packs/day: 0.5 packs/day for 10.0 years (5.0 ttl pk-yrs)    Types: Cigarettes   Smokeless tobacco: Never  Vaping Use   Vaping status: Former   Substances: THC  Substance and Sexual Activity   Alcohol use: Yes    Alcohol/week: 3.0 standard drinks of alcohol    Types: 3 Cans of beer per week    Comment: 3 beers weekly   Drug use: Yes    Types: Marijuana   Sexual activity: Yes    Partners: Male    Birth control/protection: Pill    Comment: Mirena  Other Topics Concern   Not on file  Social History Narrative   Not on file   Social Drivers of Health   Financial Resource Strain: Low Risk  (09/16/2023)   Overall Financial Resource Strain (CARDIA)    Difficulty of Paying Living Expenses: Not hard at all  Food Insecurity: No Food Insecurity (09/16/2023)   Hunger Vital Sign    Worried About Running Out of Food in the Last Year: Never true    Ran Out of Food in the Last Year: Never true  Transportation Needs: No Transportation Needs (09/16/2023)   PRAPARE - Scientist, research (physical sciences) (Medical): No    Lack of Transportation (Non-Medical): No  Physical Activity: Insufficiently Active (09/16/2023)   Exercise Vital Sign    Days of Exercise per Week: 2 days    Minutes of Exercise per Session: 20 min  Stress: No Stress Concern Present (09/16/2023)   [employer] of Occupational Health - Occupational Stress Questionnaire    Feeling of Stress : Only a little  Social Connections: Socially Isolated (09/16/2023)   Social Connection and Isolation Panel [NHANES]    Frequency of Communication with Friends and Family: More than three times a week    Frequency of Social Gatherings with Friends and Family: Never  Attends Religious Services: Never    Active Member of Clubs or Organizations: No    Attends Banker Meetings: Never    Marital Status: Never married    Tobacco Counseling Ready to quit: Not Answered Counseling given: Not Answered    Clinical Intake:  Pre-visit preparation completed: Yes  Pain : No/denies pain     BMI - recorded: 37.3 Nutritional Status: BMI > 30  Obese Nutritional Risks: None Diabetes: No  Lab Results  Component Value Date   HGBA1C 5.4 10/25/2022   HGBA1C 5.1 10/14/2021     How often do you need to have someone help you when you read instructions, pamphlets, or other written materials from your doctor or pharmacy?: 1 - Never  Interpreter Needed?: No  Information entered by :: Kennedy Bucker, LPN   Activities of Daily Living    09/16/2023    1:23 PM 04/08/2023    9:23 AM  In your present state of health, do you have any difficulty performing the following activities:  Hearing? 0 0  Vision? 0 0  Difficulty concentrating or making decisions? 0 0  Comment REMEMBERING THINGS SOMETIMES IS HARD   Walking or climbing stairs? 0 0  Dressing or bathing? 0 0  Doing errands, shopping? 0 0  Preparing Food and eating ? N   Using the Toilet? N   In the past six months, have you accidently leaked urine? N    Do you have problems with loss of bowel control? N   Managing your Medications? N   Managing your Finances? N   Housekeeping or managing your Housekeeping? N     Patient Care Team: Lorre Munroe, NP as PCP - General (Internal Medicine) Kennyth Arnold, NP as Nurse Practitioner (Psychiatry) Jesusita Oka, MD (Dermatology)  Indicate any recent Medical Services you may have received from other than [facility] providers in the past year (date may be approximate).     Assessment:   This is a routine wellness examination for Takirah.  Hearing/Vision screen Hearing Screening - Comments:: NO AIDS Vision Screening - Comments:: WEARS READERS, DRIVING AT NIGHT-    Goals Addressed             This Visit's Progress    DIET - INCREASE WATER INTAKE         Depression Screen     09/16/2023    1:21 PM 04/08/2023    9:23 AM 10/25/2022    9:37 AM 09/10/2022    9:22 AM 07/16/2022    3:27 PM 04/15/2022    8:51 AM 02/25/2022    1:35 PM  PHQ 2/9 Scores  PHQ - 2 Score 0 1 0 0 1 0 1  PHQ- 9 Score 0 5 0 0  0 3    Fall Risk     09/16/2023    1:23 PM 04/08/2023    9:23 AM 10/25/2022    9:37 AM 09/10/2022    9:24 AM 07/16/2022    3:27 PM  Fall Risk   Falls in the past year? 0 0 0 0 0  Number falls in past yr: 0   0   Injury with Fall? 0  0 0 0  Risk for fall due to : No Fall Risks  No Fall Risks No Fall Risks No Fall Risks  Follow up Falls prevention discussed;Falls evaluation completed   Falls prevention discussed;Falls evaluation completed     MEDICARE RISK AT HOME:  Medicare Risk at Home Any stairs in or  around the home?: No If so, are there any without handrails?: No Home free of loose throw rugs in walkways, pet beds, electrical cords, etc?: Yes Adequate lighting in your home to reduce risk of falls?: Yes Life alert?: No Use of a cane, walker or w/c?: No Grab bars in the bathroom?: No Shower chair or bench in shower?: No Elevated toilet seat or a handicapped toilet?:  No  TIMED UP AND GO:  Was the test performed?  No  Cognitive Function: 6CIT completed        09/16/2023    1:25 PM 09/07/2021    1:16 PM  6CIT Screen  What Year? 0 points 0 points  What month? 0 points 0 points  What time? 0 points 0 points  Count back from 20 0 points 0 points  Months in reverse 0 points 0 points  Repeat phrase 0 points 0 points  Total Score 0 points 0 points    Immunizations Immunization History  Administered Date(s) Administered   HPV 9-valent 08/15/2015, 12/16/2015   HPV Quadrivalent 04/19/2014   Influenza, Seasonal, Injecte, Preservative Fre 04/08/2023   Influenza,inj,Quad PF,6+ Mos 04/13/2021, 02/25/2022   Influenza-Unspecified 02/21/2019, 06/05/2020   PFIZER(Purple Top)SARS-COV-2 Vaccination 12/04/2019, 12/27/2019, 07/09/2020   PPD Test 07/30/2015, 08/18/2016, 11/15/2017   Pneumococcal Polysaccharide-23 03/21/2015, 04/13/2021   Tdap 08/15/2015, 07/09/2020    Screening Tests Health Maintenance  Topic Date Due   Pneumococcal Vaccine 43-53 Years old (2 of 2 - PCV) 04/13/2022   Medicare Annual Wellness (AWV)  09/15/2024   Cervical Cancer Screening (HPV/Pap Cotest)  07/07/2026   DTaP/Tdap/Td (3 - Td or Tdap) 07/09/2030   INFLUENZA VACCINE  Completed   HPV VACCINES  Completed   Hepatitis C Screening  Completed   HIV Screening  Completed   COVID-19 Vaccine  Discontinued    Health Maintenance  Health Maintenance Due  Topic Date Due   Pneumococcal Vaccine 59-7 Years old (2 of 2 - PCV) 04/13/2022   Health Maintenance Items Addressed: UP TO DATE ON SHOTS  Additional Screening:  Vision Screening: Recommended annual ophthalmology exams for early detection of glaucoma and other disorders of the eye.  Dental Screening: Recommended annual dental exams for proper oral hygiene  Community Resource Referral / Chronic Care Management: CRR required this visit?  No   CCM required this visit?  No     Plan:     I have personally reviewed and  noted the following in the patient's chart:   Medical and social history Use of alcohol, tobacco or illicit drugs  Current medications and supplements including opioid prescriptions. Patient is not currently taking opioid prescriptions. Functional ability and status Nutritional status Physical activity Advanced directives List of other physicians Hospitalizations, surgeries, and ER visits in previous 12 months Vitals Screenings to include cognitive, depression, and falls Referrals and appointments  In addition, I have reviewed and discussed with patient certain preventive protocols, quality metrics, and best practice recommendations. A written personalized care plan for preventive services as well as general preventive health recommendations were provided to patient.     Hal Hope, LPN   1/61/0960   After Visit Summary: (MyChart) Due to this being a telephonic visit, the after visit summary with patients personalized plan was offered to patient via MyChart   Notes: Nothing significant to report at this time.

## 2023-09-16 NOTE — Patient Instructions (Addendum)
 Ms. Jon , Thank you for taking time to come for your Medicare Wellness Visit. I appreciate your ongoing commitment to your health goals. Please review the following plan we discussed and let me know if I can assist you in the future.   Referrals/Orders/Follow-Ups/Clinician Recommendations: NONE  This is a list of the screening recommended for you and due dates:  Health Maintenance  Topic Date Due   Pneumococcal Vaccination (2 of 2 - PCV) 04/13/2022   Medicare Annual Wellness Visit  09/15/2024   Pap with HPV screening  07/07/2026   DTaP/Tdap/Td vaccine (3 - Td or Tdap) 07/09/2030   Flu Shot  Completed   HPV Vaccine  Completed   Hepatitis C Screening  Completed   HIV Screening  Completed   COVID-19 Vaccine  Discontinued    Advanced directives: (ACP Link)Information on Advanced Care Planning can be found at [facility] Advance Health Care Directives Advance Health Care Directives. [email or web address]   Next Medicare Annual Wellness Visit scheduled for next year: Yes   09/21/24 @ 1:20 PM BY PHONE

## 2023-10-11 ENCOUNTER — Ambulatory Visit (INDEPENDENT_AMBULATORY_CARE_PROVIDER_SITE_OTHER): Admitting: Dermatology

## 2023-10-11 ENCOUNTER — Encounter: Payer: Self-pay | Admitting: Dermatology

## 2023-10-11 DIAGNOSIS — L732 Hidradenitis suppurativa: Secondary | ICD-10-CM

## 2023-10-11 DIAGNOSIS — Z79899 Other long term (current) drug therapy: Secondary | ICD-10-CM

## 2023-10-11 DIAGNOSIS — R220 Localized swelling, mass and lump, head: Secondary | ICD-10-CM

## 2023-10-11 DIAGNOSIS — L700 Acne vulgaris: Secondary | ICD-10-CM | POA: Diagnosis not present

## 2023-10-11 DIAGNOSIS — Z7189 Other specified counseling: Secondary | ICD-10-CM

## 2023-10-11 DIAGNOSIS — L729 Follicular cyst of the skin and subcutaneous tissue, unspecified: Secondary | ICD-10-CM

## 2023-10-11 MED ORDER — TRETINOIN 0.025 % EX CREA
TOPICAL_CREAM | Freq: Every day | CUTANEOUS | 2 refills | Status: DC
Start: 2023-10-11 — End: 2024-01-10

## 2023-10-11 MED ORDER — SECUKINUMAB 300 MG/2ML ~~LOC~~ SOAJ
300.0000 mg | SUBCUTANEOUS | Status: AC
  Administered 2023-11-10 – 2023-12-08 (×2): 300 mg via SUBCUTANEOUS

## 2023-10-11 MED ORDER — SECUKINUMAB 300 MG/2ML ~~LOC~~ SOAJ
300.0000 mg | Freq: Once | SUBCUTANEOUS | Status: AC
  Administered 2023-10-11: 300 mg via SUBCUTANEOUS

## 2023-10-11 NOTE — Patient Instructions (Signed)

## 2023-10-11 NOTE — Progress Notes (Signed)
 Follow Up Visit   Subjective  Ellen Kaiser is a 33 y.o. female who presents for the following: HS  Patient on Cosentyx  300 mg sq q 4 weeks. Patient advises she has not had any outbreaks since starting the Cosentyx .  Patient also taking doxycycline  100 mg twice daily for acne, she did not get the tretinoin . Spot near right eye has not improved.   The following portions of the chart were reviewed this encounter and updated as appropriate: medications, allergies, medical history  Review of Systems:  No other skin or systemic complaints except as noted in HPI or Assessment and Plan.  Objective  Well appearing patient in no apparent distress; mood and affect are within normal limits.  A focused examination was performed of the following areas:  face  Relevant exam findings are noted in the Assessment and Plan.    Assessment & Plan   HIDRADENITIS SUPPURATIVA   Related Medications Secukinumab , 300 MG Dose, (COSENTYX  SENSOREADY, 300 MG,) 150 MG/ML SOAJ Inject 2 mLs (300 mg total) into the skin as directed. On week 0, 1, 2, 3 and 4. Secukinumab , 300 MG Dose, (COSENTYX  SENSOREADY, 300 MG,) 150 MG/ML SOAJ Inject 2 mLs (300 mg total) into the skin every 28 (twenty-eight) days. For maintenance. Secukinumab  SOAJ 300 mg  Secukinumab  SOAJ 300 mg  ACNE VULGARIS   Related Medications tretinoin  (RETIN-A ) 0.025 % cream Apply topically at bedtime. As directed. LONG-TERM USE OF HIGH-RISK MEDICATION   COUNSELING AND COORDINATION OF CARE   CYST OF SKIN    HIDRADENITIS SUPPURATIVA Exam: deferred  Chronic and persistent condition with duration or expected duration over one year. Condition is improving with treatment but not currently at goal.   Hidradenitis Suppurativa is a chronic; persistent; non-curable, but treatable condition due to abnormal inflamed sweat glands in the body folds (axilla, inframammary, groin, medial thighs), causing recurrent painful draining cysts  and scarring. It can be associated with severe scarring acne and cysts; also abscesses and scarring of scalp. The goal is control and prevention of flares, as it is not curable. Scars are permanent and can be thickened. Treatment may include daily use of topical medication and oral antibiotics.  Oral isotretinoin may also be helpful.  For some cases, Humira or Cosentyx  (biologic injections) may be prescribed to decrease the inflammatory process and prevent flares.  When indicated, inflamed cysts may also be treated surgically.  Treatment Plan: Cosentyx  300 mg injected today to right upper arm. Patient tolerated well. Continue every 4 weeks [id number] Lot # [id number]  Exp: 03/20/2025  Long term medication management.  Patient is using long term (months to years) prescription medication  to control their dermatologic condition.  These medications require periodic monitoring to evaluate for efficacy and side effects and may require periodic laboratory monitoring.   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 3 months for benefit. Apply at night. May cause dryness irritation. Moisturize after applying if needed  Periorbital cystic subcutaneous nodule in setting of past preseptal cellulitis Exam: subQ nodule on R nasal bridge  06/04/23 CT Maxillofacial with contrast: Right side Preseptal Cellulitis. No postseptal involvement or abscess; tiny 8 mm skin associated phlegmon superior and medial to the orbit.  Plan:  8 mm lesion on CT has led to persistent cystic lesion Will refer to ENT for evaluation and possible excision. If not ENT then would send to plastic  surgery D/C doxycycline  since it didn't help   Return in about 3 months (around 01/10/2024) for acne.  Kerstin Peeling, RMA, am acting as scribe for Harris Liming, MD .   Documentation: I have  reviewed the above documentation for accuracy and completeness, and I agree with the above.  Harris Liming, MD

## 2023-10-27 ENCOUNTER — Ambulatory Visit (INDEPENDENT_AMBULATORY_CARE_PROVIDER_SITE_OTHER): Payer: Self-pay | Admitting: Internal Medicine

## 2023-10-27 ENCOUNTER — Encounter: Payer: Self-pay | Admitting: Internal Medicine

## 2023-10-27 VITALS — BP 118/74 | Ht 60.0 in | Wt 191.8 lb

## 2023-10-27 DIAGNOSIS — R739 Hyperglycemia, unspecified: Secondary | ICD-10-CM

## 2023-10-27 DIAGNOSIS — Z136 Encounter for screening for cardiovascular disorders: Secondary | ICD-10-CM | POA: Diagnosis not present

## 2023-10-27 DIAGNOSIS — Z0001 Encounter for general adult medical examination with abnormal findings: Secondary | ICD-10-CM

## 2023-10-27 DIAGNOSIS — Z6837 Body mass index (BMI) 37.0-37.9, adult: Secondary | ICD-10-CM

## 2023-10-27 DIAGNOSIS — E66812 Obesity, class 2: Secondary | ICD-10-CM

## 2023-10-27 DIAGNOSIS — Z113 Encounter for screening for infections with a predominantly sexual mode of transmission: Secondary | ICD-10-CM | POA: Diagnosis not present

## 2023-10-27 NOTE — Progress Notes (Signed)
 Subjective:    Patient ID: Ellen Kaiser, female    DOB: 06/11/1991, 33 y.o.   MRN: [id number]  HPI  Patient presents to clinic today for her annual exam.  Flu: 03/2023 Tetanus: 06/2020 Pneumovax: 03/2021 COVID: Pfizer x 3 Pap smear: 06/2021 Dentist: biannually  Diet: She does eat meat. She consumes fruits and veggies. She does eat some fried foods. She drinks mostly water, juice or soda. Exercise: None    Review of Systems  Past Medical History:  Diagnosis Date   Allergy    Anxiety    Asthma    Bipolar 1 disorder (HCC)    BV (bacterial vaginosis)    Depression    HSV-2 seropositive     Current Outpatient Medications  Medication Sig Dispense Refill   ABILIFY MAINTENA 400 MG PRSY prefilled syringe Inject into the muscle.     albuterol  (VENTOLIN  HFA) 108 (90 Base) MCG/ACT inhaler INHALE 1 PUFF INTO THE LUNGS EVERY 6 HOURS AS NEEDED FOR WHEEZING OR SHORTNESS OF BREATH. 8.5 each 0   ARISTADA 882 MG/3.2ML prefilled syringe SMARTSIG:3.2 Milliliter(s) IM Once a Month     busPIRone (BUSPAR) 10 MG tablet TAKE 1 TABLET BY MOUTH 2 TO 3 TIMES DIALY AS NEEDED FOR ANXIETY     clonazePAM (KLONOPIN) 1 MG tablet Take 1 tablet by mouth 2 (two) times daily.     hydrOXYzine (ATARAX/VISTARIL) 50 MG tablet Take 100 mg by mouth daily.      methocarbamol  (ROBAXIN ) 500 MG tablet Take 1 tablet (500 mg total) by mouth every 8 (eight) hours as needed for muscle spasms. 30 tablet 0   ondansetron  (ZOFRAN -ODT) 4 MG disintegrating tablet Take 1 tablet (4 mg total) by mouth every 8 (eight) hours as needed for nausea or vomiting. (Patient not taking: Reported on 09/16/2023) 20 tablet 0   predniSONE  (DELTASONE ) 10 MG tablet Take 3 tabs on days 1-3, 2 tabs on days 4-6, 1 tab on days 7-9 (Patient not taking: Reported on 09/16/2023) 18 tablet 0   Secukinumab  (COSENTYX  UNOREADY) 300 MG/2ML SOAJ Inject 300 mg into the skin as directed. Inject 2 mLs (300 mg total) into the skin as directed. On week 0, 1,  2, 3 and 4. 10 mL 0   Secukinumab  (COSENTYX  UNOREADY) 300 MG/2ML SOAJ Inject 300 mg into the skin every 28 (twenty-eight) days. 2 mL 4   Secukinumab , 300 MG Dose, (COSENTYX  SENSOREADY, 300 MG,) 150 MG/ML SOAJ Inject 2 mLs (300 mg total) into the skin as directed. On week 0, 1, 2, 3 and 4. 10 mL 0   Secukinumab , 300 MG Dose, (COSENTYX  SENSOREADY, 300 MG,) 150 MG/ML SOAJ Inject 2 mLs (300 mg total) into the skin every 28 (twenty-eight) days. For maintenance. 2 mL 5   spironolactone  (ALDACTONE ) 50 MG tablet Take 1 tablet (50 mg total) by mouth daily. 7 tablet 0   tretinoin  (RETIN-A ) 0.025 % cream Apply topically at bedtime. As directed. 45 g 2   Vitamin D , Ergocalciferol , (DRISDOL ) 1.25 MG (50000 UNIT) CAPS capsule Take 1 capsule (50,000 Units total) by mouth once a week. For 12 weeks. Then start OTC Vitamin D3 2,000 unit daily. (Patient not taking: Reported on 09/16/2023) 12 capsule 0   Current Facility-Administered Medications  Medication Dose Route Frequency Provider Last Rate Last Admin   [START ON 11/08/2023] Secukinumab  SOAJ 300 mg  300 mg Subcutaneous Q28 days Harris Liming, MD        Allergies  Allergen Reactions   Fish Allergy Anaphylaxis and Swelling  Family History  Problem Relation Age of Onset   Depression Mother    Alcohol abuse Mother    Bipolar disorder Mother    Healthy Father    Healthy Sister    Osteoarthritis Maternal Grandmother    Dementia Paternal Grandmother     Social History   Socioeconomic History   Marital status: Single    Spouse name: Not on file   Number of children: Not on file   Years of education: college   Highest education level: Not on file  Occupational History   Occupation: disabled    Comment: PTSD  Tobacco Use   Smoking status: Every Day    Current packs/day: 0.50    Average packs/day: 0.5 packs/day for 10.0 years (5.0 ttl pk-yrs)    Types: Cigarettes   Smokeless tobacco: Never  Vaping Use   Vaping status: Former    Substances: THC  Substance and Sexual Activity   Alcohol use: Yes    Alcohol/week: 3.0 standard drinks of alcohol    Types: 3 Cans of beer per week    Comment: 3 beers weekly   Drug use: Yes    Types: Marijuana   Sexual activity: Yes    Partners: Male    Birth control/protection: Pill    Comment: Mirena  Other Topics Concern   Not on file  Social History Narrative   Not on file   Social Drivers of Health   Financial Resource Strain: Low Risk  (09/16/2023)   Overall Financial Resource Strain (CARDIA)    Difficulty of Paying Living Expenses: Not hard at all  Food Insecurity: No Food Insecurity (09/16/2023)   Hunger Vital Sign    Worried About Running Out of Food in the Last Year: Never true    Ran Out of Food in the Last Year: Never true  Transportation Needs: No Transportation Needs (09/16/2023)   PRAPARE - Administrator, Civil Service (Medical): No    Lack of Transportation (Non-Medical): No  Physical Activity: Insufficiently Active (09/16/2023)   Exercise Vital Sign    Days of Exercise per Week: 2 days    Minutes of Exercise per Session: 20 min  Stress: No Stress Concern Present (09/16/2023)   [employer] of Occupational Health - Occupational Stress Questionnaire    Feeling of Stress : Only a little  Social Connections: Socially Isolated (09/16/2023)   Social Connection and Isolation Panel [NHANES]    Frequency of Communication with Friends and Family: More than three times a week    Frequency of Social Gatherings with Friends and Family: Never    Attends Religious Services: Never    Database administrator or Organizations: No    Attends Banker Meetings: Never    Marital Status: Never married  Intimate Partner Violence: Not At Risk (09/16/2023)   Humiliation, Afraid, Rape, and Kick questionnaire    Fear of Current or Ex-Partner: No    Emotionally Abused: No    Physically Abused: No    Sexually Abused: No     Constitutional: Patient  reports intermittent headaches.  Denies fever, malaise, fatigue, or abrupt weight changes.  HEENT: Denies eye pain, eye redness, ear pain, ringing in the ears, wax buildup, runny nose, nasal congestion, bloody nose, or sore throat. Respiratory: Denies difficulty breathing, shortness of breath, cough or sputum production.   Cardiovascular: Denies chest pain, chest tightness, palpitations or swelling in the hands or feet.  Gastrointestinal: Denies abdominal pain, bloating, constipation, diarrhea or blood in the  stool.  GU: Denies urgency, frequency, pain with urination, burning sensation, blood in urine, odor or discharge. Musculoskeletal: Denies decrease in range of motion, difficulty with gait, muscle pain or joint pain and swelling.  Skin: Denies redness, rashes, lesions or ulcercations.  Neurological: Denies dizziness, difficulty with memory, difficulty with speech or problems with balance and coordination.  Psych: Patient has a history of anxiety and depression.  Denies SI/HI.  No other specific complaints in a complete review of systems (except as listed in HPI above).     Objective:   Physical Exam  BP 118/74 (BP Location: Left Arm, Patient Position: Sitting, Cuff Size: Normal)   Ht 5' (1.524 m)   Wt 191 lb 12.8 oz (87 kg)   LMP 10/26/2023 (Exact Date)   BMI 37.46 kg/m    Wt Readings from Last 3 Encounters:  07/28/23 191 lb 6.4 oz (86.8 kg)  07/14/23 190 lb 9.6 oz (86.5 kg)  06/27/23 185 lb 9.6 oz (84.2 kg)    General: Appears her stated age, obese, in NAD. Skin: Warm, dry and intact.  Acne noted on face. HEENT: Head: normal shape and size; Eyes: sclera white, no icterus, conjunctiva pink, PERRLA and EOMs intact;  Neck:  Neck supple, trachea midline. No masses, lumps or thyromegaly present.  Cardiovascular: Normal rate and rhythm. S1,S2 noted.  No murmur, rubs or gallops noted. No JVD or BLE edema.  Pulmonary/Chest: Normal effort and positive vesicular breath sounds. No  respiratory distress. No wheezes, rales or ronchi noted.  Abdomen: Normal bowel sounds.  Musculoskeletal: Strength 5/5 BUE/BLE.  No difficulty with gait.  Neurological: Alert and oriented. Cranial nerves II-XII grossly intact. Coordination normal.  Psychiatric: Mood and affect normal. Behavior is normal. Judgment and thought content normal.    BMET    Component Value Date/Time   NA 139 07/14/2023 0905   K 4.2 07/14/2023 0905   CL 107 07/14/2023 0905   CO2 23 07/14/2023 0905   GLUCOSE 92 07/14/2023 0905   BUN 9 07/14/2023 0905   CREATININE 1.05 (H) 07/14/2023 0905   CALCIUM 10.1 07/14/2023 0905   GFRNONAA >60 06/04/2023 0321   GFRNONAA 81 05/26/2020 0947   GFRAA 94 05/26/2020 0947    Lipid Panel     Component Value Date/Time   CHOL 155 10/25/2022 0937   TRIG 94 10/25/2022 0937   HDL 93 10/25/2022 0937   CHOLHDL 1.7 10/25/2022 0937   LDLCALC 44 10/25/2022 0937    CBC    Component Value Date/Time   WBC 7.2 07/14/2023 0905   RBC 4.25 07/14/2023 0905   HGB 14.1 07/14/2023 0905   HCT 41.9 07/14/2023 0905   PLT 288 07/14/2023 0905   MCV 98.6 07/14/2023 0905   MCH 33.2 (H) 07/14/2023 0905   MCHC 33.7 07/14/2023 0905   RDW 12.9 07/14/2023 0905   LYMPHSABS 2.1 06/04/2023 0321   MONOABS 0.6 06/04/2023 0321   EOSABS 22 07/14/2023 0905   BASOSABS 43 07/14/2023 0905    Hgb A1C Lab Results  Component Value Date   HGBA1C 5.4 10/25/2022            Assessment & Plan:   Preventative Health Maintenance:  Encouraged her to get a flu shot in the fall Tetanus UTD Encouraged her to get her COVID booster Pap smear UTD Encouraged her to consume a balanced diet and exercise regimen Advised her seeing eye doctor and dentist annually We will check CBC, c-Met, lipid, A1c, HIV, RPR and Hep C today  RTC in  6 months, follow-up chronic conditions Helayne Lo, NP

## 2023-10-27 NOTE — Assessment & Plan Note (Signed)
 Encouraged diet and exercise for weight loss ?

## 2023-10-27 NOTE — Patient Instructions (Signed)

## 2023-10-28 ENCOUNTER — Encounter: Payer: Self-pay | Admitting: Internal Medicine

## 2023-10-28 LAB — COMPREHENSIVE METABOLIC PANEL WITH GFR
AG Ratio: 1.9 (calc) (ref 1.0–2.5)
ALT: 14 U/L (ref 6–29)
AST: 17 U/L (ref 10–30)
Albumin: 4.4 g/dL (ref 3.6–5.1)
Alkaline phosphatase (APISO): 58 U/L (ref 31–125)
BUN: 10 mg/dL (ref 7–25)
CO2: 24 mmol/L (ref 20–32)
Calcium: 9.8 mg/dL (ref 8.6–10.2)
Chloride: 108 mmol/L (ref 98–110)
Creat: 0.92 mg/dL (ref 0.50–0.97)
Globulin: 2.3 g/dL (ref 1.9–3.7)
Glucose, Bld: 84 mg/dL (ref 65–99)
Potassium: 4.2 mmol/L (ref 3.5–5.3)
Sodium: 136 mmol/L (ref 135–146)
Total Bilirubin: 0.7 mg/dL (ref 0.2–1.2)
Total Protein: 6.7 g/dL (ref 6.1–8.1)
eGFR: 84 mL/min/{1.73_m2} (ref >=60)

## 2023-10-28 LAB — HEMOGLOBIN A1C
Hgb A1c MFr Bld: 5.4 % (ref <5.7)
Mean Plasma Glucose: 108 mg/dL
eAG (mmol/L): 6 mmol/L

## 2023-10-28 LAB — CBC
HCT: 42.5 % (ref 35.0–45.0)
Hemoglobin: 14 g/dL (ref 11.7–15.5)
MCH: 32 pg (ref 27.0–33.0)
MCHC: 32.9 g/dL (ref 32.0–36.0)
MCV: 97 fL (ref 80.0–100.0)
MPV: 11 fL (ref 7.5–12.5)
Platelets: 268 10*3/uL (ref 140–400)
RBC: 4.38 10*6/uL (ref 3.80–5.10)
RDW: 12.5 % (ref 11.0–15.0)
WBC: 6.7 10*3/uL (ref 3.8–10.8)

## 2023-10-28 LAB — LIPID PANEL
Cholesterol: 176 mg/dL (ref <200)
HDL: 76 mg/dL (ref >=50)
LDL Cholesterol (Calc): 83 mg/dL
Non-HDL Cholesterol (Calc): 100 mg/dL (ref <130)
Total CHOL/HDL Ratio: 2.3 (calc) (ref <5.0)
Triglycerides: 80 mg/dL (ref <150)

## 2023-10-28 LAB — HEPATITIS C ANTIBODY: Hepatitis C Ab: NONREACTIVE

## 2023-10-28 LAB — HIV ANTIBODY (ROUTINE TESTING W REFLEX): HIV 1&2 Ab, 4th Generation: NONREACTIVE

## 2023-10-28 LAB — RPR: RPR Ser Ql: NONREACTIVE

## 2023-11-10 ENCOUNTER — Ambulatory Visit (INDEPENDENT_AMBULATORY_CARE_PROVIDER_SITE_OTHER)

## 2023-11-10 DIAGNOSIS — L732 Hidradenitis suppurativa: Secondary | ICD-10-CM | POA: Diagnosis not present

## 2023-11-10 NOTE — Progress Notes (Signed)
 Patient here today for Cosentyx  injection for Hidradenitis Suppurativa.   Cosentyx  300mg /52mL injected into left upper arm. Patient tolerated injection well.   LOT: [id number] EXP: 01/17/2025 NDC: 9528-4132-44  Lisbeth Rides, RMA

## 2023-12-08 ENCOUNTER — Ambulatory Visit

## 2023-12-08 DIAGNOSIS — L732 Hidradenitis suppurativa: Secondary | ICD-10-CM

## 2023-12-08 NOTE — Progress Notes (Signed)
 Patient here today for Cosentyx  injection for Hidradenitis Suppurativa.    Cosentyx  300mg /61mL injected into right upper arm. Patient tolerated injection well.    LOT: [id number] EXP: 05/20/2025 NDC: 0981-1914-78   Lisbeth Rides, RMA

## 2023-12-22 ENCOUNTER — Other Ambulatory Visit: Payer: Self-pay | Admitting: Dermatology

## 2023-12-22 DIAGNOSIS — L700 Acne vulgaris: Secondary | ICD-10-CM

## 2024-01-06 ENCOUNTER — Ambulatory Visit
Admission: RE | Admit: 2024-01-06 | Discharge: 2024-01-06 | Disposition: A | Discharge status: Home / Self Care | Source: Ambulatory Visit | Attending: Emergency Medicine | Admitting: Emergency Medicine

## 2024-01-06 VITALS — BP 117/76 | HR 71 | Temp 98.9°F | Resp 18

## 2024-01-06 DIAGNOSIS — Z114 Encounter for screening for human immunodeficiency virus [HIV]: Secondary | ICD-10-CM | POA: Insufficient documentation

## 2024-01-06 DIAGNOSIS — N898 Other specified noninflammatory disorders of vagina: Secondary | ICD-10-CM | POA: Insufficient documentation

## 2024-01-06 LAB — POCT URINE PREGNANCY: Preg Test, Ur: NEGATIVE

## 2024-01-06 NOTE — ED Provider Notes (Signed)
 [facility]    CSN: [id number] Arrival date & time: 01/06/24  0932      History   Chief Complaint Chief Complaint  Patient presents with   SEXUALLY TRANSMITTED DISEASE    Entered by patient    HPI Ellen Kaiser is a 33 y.o. female.   She presents for evaluation of yellow vaginal discharge with odor and itching present for 4 days.  Sexually active, no known exposure.  Has attempted use of Vagisil without improvement.  Denies urinary symptoms, abdominal pain.  Instruction has been irregular since last month, had to cycles of menstruation as well as vaginal spotting.  Past Medical History:  Diagnosis Date   Allergy    Anxiety    Asthma    Bipolar 1 disorder (HCC)    BV (bacterial vaginosis)    Depression    HSV-2 seropositive     Patient Active Problem List   Diagnosis Date Noted   Class 2 obesity due to excess calories with body mass index (BMI) of 37.0 to 37.9 in adult 04/13/2021   Hidradenitis suppurativa 12/28/2019   GAD (generalized anxiety disorder) 10/08/2019   GERD (gastroesophageal reflux disease) 10/08/2019   HSV-2 seropositive 05/23/2019   Bipolar 1 disorder (HCC) 04/02/2019   Migraine without aura and without status migrainosus, not intractable 04/02/2019   Mild intermittent asthma without complication 03/21/2015    Past Surgical History:  Procedure Laterality Date   WISDOM TOOTH EXTRACTION      OB History     Gravida  0   Para  0   Term  0   Preterm  0   AB  0   Living  0      SAB  0   IAB  0   Ectopic  0   Multiple  0   Live Births  0            Home Medications    Prior to Admission medications   Medication Sig Start Date End Date Taking? Authorizing Provider  ABILIFY MAINTENA 400 MG PRSY prefilled syringe Inject into the muscle. 08/04/21   [provider]  albuterol  (VENTOLIN  HFA) 108 (90 Base) MCG/ACT inhaler INHALE 1 PUFF INTO THE LUNGS EVERY 6 HOURS AS NEEDED FOR WHEEZING OR SHORTNESS OF  BREATH. 12/29/22   Antonette Angeline ORN, NP  ARISTADA 882 MG/3.2ML prefilled syringe SMARTSIG:3.2 Milliliter(s) IM Once a Month 04/25/23   [provider]  busPIRone (BUSPAR) 10 MG tablet TAKE 1 TABLET BY MOUTH 2 TO 3 TIMES DIALY AS NEEDED FOR ANXIETY 08/27/20   [provider]  clonazePAM (KLONOPIN) 1 MG tablet Take 1 tablet by mouth 2 (two) times daily. 02/28/19   [provider]  doxycycline  (MONODOX ) 100 MG capsule TAKE 1 CAPSULE (100 MG TOTAL) BY MOUTH 2 (TWO) TIMES DAILY. TAKE WITH FOOD 12/22/23   Claudene Lehmann, MD  hydrOXYzine (ATARAX/VISTARIL) 50 MG tablet Take 100 mg by mouth daily.  03/30/19   [provider]  Secukinumab  (COSENTYX  UNOREADY) 300 MG/2ML SOAJ Inject 300 mg into the skin as directed. Inject 2 mLs (300 mg total) into the skin as directed. On week 0, 1, 2, 3 and 4. 07/25/23   Claudene Lehmann, MD  Secukinumab  (COSENTYX  UNOREADY) 300 MG/2ML SOAJ Inject 300 mg into the skin every 28 (twenty-eight) days. 07/25/23   Claudene Lehmann, MD  Secukinumab , 300 MG Dose, (COSENTYX  SENSOREADY, 300 MG,) 150 MG/ML SOAJ Inject 2 mLs (300 mg total) into the skin as directed. On week  0, 1, 2, 3 and 4. 07/19/23   Claudene Lehmann, MD  Secukinumab , 300 MG Dose, (COSENTYX  SENSOREADY, 300 MG,) 150 MG/ML SOAJ Inject 2 mLs (300 mg total) into the skin every 28 (twenty-eight) days. For maintenance. 07/19/23   Claudene Lehmann, MD  spironolactone  (ALDACTONE ) 50 MG tablet Take 1 tablet (50 mg total) by mouth daily. 06/10/23   Antonette Angeline ORN, NP  tretinoin  (RETIN-A ) 0.025 % cream Apply topically at bedtime. As directed. 10/11/23 10/10/24  Claudene Lehmann, MD    Family History Family History  Problem Relation Age of Onset   Depression Mother    Alcohol abuse Mother    Bipolar disorder Mother    Healthy Father    Healthy Sister    Osteoarthritis Maternal Grandmother    Dementia Paternal Grandmother     Social History Social History   Tobacco Use   Smoking  status: Every Day    Current packs/day: 0.50    Average packs/day: 0.5 packs/day for 10.0 years (5.0 ttl pk-yrs)    Types: Cigarettes   Smokeless tobacco: Never  Vaping Use   Vaping status: Former   Substances: THC  Substance Use Topics   Alcohol use: Yes    Alcohol/week: 3.0 standard drinks of alcohol    Types: 3 Cans of beer per week    Comment: 3 beers weekly   Drug use: Yes    Types: Marijuana     Allergies   Fish allergy   Review of Systems Review of Systems   Physical Exam Triage Vital Signs ED Triage Vitals  Encounter Vitals Group     BP 01/06/24 0955 117/76     Girls Systolic BP Percentile --      Girls Diastolic BP Percentile --      Boys Systolic BP Percentile --      Boys Diastolic BP Percentile --      Pulse Rate 01/06/24 0955 71     Resp 01/06/24 0955 18     Temp 01/06/24 0955 98.9 F (37.2 C)     Temp Source 01/06/24 0955 Oral     SpO2 01/06/24 0955 100 %     Weight --      Height --      Head Circumference --      Peak Flow --      Pain Score 01/06/24 0958 0     Pain Loc --      Pain Education --      Exclude from Growth Chart --    No data found.  Updated Vital Signs BP 117/76 (BP Location: Left Arm)   Pulse 71   Temp 98.9 F (37.2 C) (Oral)   Resp 18   LMP 01/01/2024 Comment: spotting and had 2 periods in June  SpO2 100%   Visual Acuity Right Eye Distance:   Left Eye Distance:   Bilateral Distance:    Right Eye Near:   Left Eye Near:    Bilateral Near:     Physical Exam Constitutional:      Appearance: Normal appearance.  Eyes:     Extraocular Movements: Extraocular movements intact.  Pulmonary:     Effort: Pulmonary effort is normal.  Genitourinary:    Comments: deferred Neurological:     Mental Status: She is alert and oriented to person, place, and time. Mental status is at baseline.      UC Treatments / Results  Labs (all labs ordered are listed, but only abnormal results are displayed) Labs Reviewed  POCT  URINE PREGNANCY - Normal  RPR  HIV ANTIBODY (ROUTINE TESTING W REFLEX)  CERVICOVAGINAL ANCILLARY ONLY    EKG   Radiology No results found.  Procedures Procedures (including critical care time)  Medications Ordered in UC Medications - No data to display  Initial Impression / Assessment and Plan / UC Course  I have reviewed the triage vital signs and the nursing notes.  Pertinent labs & imaging results that were available during my care of the patient were reviewed by me and considered in my medical decision making (see chart for details).  Vaginal odor, encounter for HIV testing  Urine pregnancy negative, STI labs pending will treat per protocol, advised abstinence until lab results, and/or treatment is complete, advised condom use during all sexual encounters moving, may follow-up with urgent care as needed  Final Clinical Impressions(s) / UC Diagnoses   Final diagnoses:  Vaginal odor  Encounter for HIV (human immunodeficiency virus) test     Discharge Instructions      Labs pending 2-3 days, you will be contacted if positive for any sti and treatment will be sent to the pharmacy, you will have to return to the clinic if positive for gonorrhea to receive treatment   Please refrain from having sex until labs results, if positive please refrain from having sex until treatment complete and symptoms resolve   If positive for HIV, Syphilis, Chlamydia  gonorrhea or trichomoniasis please notify partner or partners so they may tested as well  Moving forward, it is recommended you use some form of protection against the transmission of sti infections  such as condoms or dental dams with each sexual encounter     ED Prescriptions   None    PDMP not reviewed this encounter.   Teresa Shelba SAUNDERS, NP 01/06/24 1020

## 2024-01-06 NOTE — ED Triage Notes (Signed)
 Patient reports yellow vaginal discharge and odor x 4 days. Patient requesting complete STD check.

## 2024-01-06 NOTE — Discharge Instructions (Addendum)
Labs pending 2-3 days, you will be contacted if positive for any sti and treatment will be sent to the pharmacy, you will have to return to the clinic if positive for gonorrhea to receive treatment   Please refrain from having sex until labs results, if positive please refrain from having sex until treatment complete and symptoms resolve   If positive for HIV, Syphilis, Chlamydia  gonorrhea or trichomoniasis please notify partner or partners so they may tested as well  Moving forward, it is recommended you use some form of protection against the transmission of sti infections  such as condoms or dental dams with each sexual encounter   

## 2024-01-07 LAB — RPR: RPR Ser Ql: NONREACTIVE

## 2024-01-07 LAB — HIV ANTIBODY (ROUTINE TESTING W REFLEX): HIV Screen 4th Generation wRfx: NONREACTIVE

## 2024-01-09 ENCOUNTER — Ambulatory Visit

## 2024-01-09 LAB — CERVICOVAGINAL ANCILLARY ONLY
Bacterial Vaginitis (gardnerella): NEGATIVE
Candida Glabrata: NEGATIVE
Candida Vaginitis: NEGATIVE
Chlamydia: NEGATIVE
Comment: NEGATIVE
Comment: NEGATIVE
Comment: NEGATIVE
Comment: NEGATIVE
Comment: NEGATIVE
Comment: NORMAL
Neisseria Gonorrhea: NEGATIVE
Trichomonas: NEGATIVE

## 2024-01-10 ENCOUNTER — Ambulatory Visit: Admitting: Dermatology

## 2024-01-10 ENCOUNTER — Ambulatory Visit

## 2024-01-10 ENCOUNTER — Encounter: Payer: Self-pay | Admitting: Dermatology

## 2024-01-10 DIAGNOSIS — L700 Acne vulgaris: Secondary | ICD-10-CM

## 2024-01-10 DIAGNOSIS — L732 Hidradenitis suppurativa: Secondary | ICD-10-CM

## 2024-01-10 DIAGNOSIS — Z79899 Other long term (current) drug therapy: Secondary | ICD-10-CM | POA: Diagnosis not present

## 2024-01-10 DIAGNOSIS — Z7189 Other specified counseling: Secondary | ICD-10-CM

## 2024-01-10 MED ORDER — CLINDAMYCIN PHOS-BENZOYL PEROX 1.2-5 % EX GEL: CUTANEOUS | 11 refills | Status: DC

## 2024-01-10 MED ORDER — TRETINOIN 0.025 % EX CREA: TOPICAL_CREAM | Freq: Every day | CUTANEOUS | 11 refills | Status: AC

## 2024-01-10 MED ORDER — SECUKINUMAB 150 MG/ML ~~LOC~~ SOAJ
300.0000 mg | SUBCUTANEOUS | Status: AC
  Administered 2024-01-10 – 2024-04-03 (×3): 300 mg via SUBCUTANEOUS

## 2024-01-10 NOTE — Patient Instructions (Signed)

## 2024-01-10 NOTE — Progress Notes (Signed)
 Follow Up Visit   Subjective  Ellen Kaiser is a 33 y.o. female who presents for the following: HS follow up, pt states condition has done well while on Cosentyx  300mg /48mL SQ Q4W, and she has no active lesions today. Acne has improved since starting Tretinoin  0.025% cream QHS at least 2-3 nights a week, it does sometimes dry out her skin, and when that happens she doesn't use it for few days. Cyst on R nasal bridge open and drained about a week and a half after her last visit, and is now healed. She did not hear from another office about her referral.  The following portions of the chart were reviewed this encounter and updated as appropriate: medications, allergies, medical history  Review of Systems:  No other skin or systemic complaints except as noted in HPI or Assessment and Plan.  Objective  Well appearing patient in no apparent distress; mood and affect are within normal limits.  A focused examination was performed of the following areas:  The face  Relevant exam findings are noted in the Assessment and Plan.    Assessment & Plan   HIDRADENITIS SUPPURATIVA  HIDRADENITIS SUPPURATIVA Exam: no lesions  Chronic condition with duration or expected duration over one year. Currently well-controlled.  Hidradenitis Suppurativa is a chronic; persistent; non-curable, but treatable condition due to abnormal inflamed sweat glands in the body folds (axilla, inframammary, groin, medial thighs), causing recurrent painful draining cysts and scarring. It can be associated with severe scarring acne and cysts; also abscesses and scarring of scalp. The goal is control and prevention of flares, as it is not curable. Scars are permanent and can be thickened. Treatment may include daily use of topical medication and oral antibiotics.  Oral isotretinoin may also be helpful.  For some cases, Humira or Cosentyx  (biologic injections) may be prescribed to decrease the inflammatory process and prevent  flares.  When indicated, inflamed cysts may also be treated surgically.  Treatment Plan: Continue Cosentyx  300mg /32mL SQ Q4W. Reviewed risks of biologics including immunosuppression, infections, injection site reaction, and failure to improve condition. Goal is control of skin condition, not cure.  Some older biologics such as Humira and Enbrel may slightly increase risk of malignancy and may worsen congestive heart failure.  Taltz and Cosentyx  may cause inflammatory bowel disease to flare. The use of biologics requires long term medication management, including periodic office visits and monitoring of blood work.  Cosentyx  300mg /74mL injected SQ into the L upper arm post. Patient tolerated injection well. AL, CMA  Long term medication management.  Patient is using long term (months to years) prescription medication  to control their dermatologic condition.  These medications require periodic monitoring to evaluate for efficacy and side effects and may require periodic laboratory monitoring. Related Medications Secukinumab , 300 MG Dose, (COSENTYX  SENSOREADY, 300 MG,) 150 MG/ML SOAJ Inject 2 mLs (300 mg total) into the skin as directed. On week 0, 1, 2, 3 and 4. Secukinumab , 300 MG Dose, (COSENTYX  SENSOREADY, 300 MG,) 150 MG/ML SOAJ Inject 2 mLs (300 mg total) into the skin every 28 (twenty-eight) days. For maintenance. Secukinumab  SOAJ 300 mg  LONG-TERM USE OF HIGH-RISK MEDICATION   COUNSELING AND COORDINATION OF CARE   MEDICATION MANAGEMENT   ACNE VULGARIS   Related Medications doxycycline  (MONODOX ) 100 MG capsule TAKE 1 CAPSULE (100 MG TOTAL) BY MOUTH 2 (TWO) TIMES DAILY. TAKE WITH FOOD Clindamycin -Benzoyl Per, Refr, gel Apply to the face QAM tretinoin  (RETIN-A ) 0.025 % cream Apply topically at bedtime. Apply  a thin coat to the face every other night as tolerated.    ACNE VULGARIS Exam: Open comedones and inflammatory papules glabella cheeks. Clearance of inflamed nodule on R  nasal bridge  Chronic and persistent condition with duration or expected duration over one year. Condition is symptomatic/ bothersome to patient. Not currently at goal.  Treatment Plan: Continue Tretinoin  0.025% cream every other night. Topical steroids (such as triamcinolone , fluocinolone, fluocinonide, mometasone , clobetasol, halobetasol, betamethasone, hydrocortisone) can cause thinning and lightening of the skin if they are used for too long in the same area. Your physician has selected the right strength medicine for your problem and area affected on the body. Please use your medication only as directed by your physician to prevent side effects.  Start Duac every morning as spot treatment. May bleach clothing linens  Return in about 3 months (around 04/11/2024) for HS follow up, Q4W with MA for Cosentyx  injection.  LILLETTE Rosina Mayans, CMA, am acting as scribe for Boneta Sharps, MD .   Documentation: I have reviewed the above documentation for accuracy and completeness, and I agree with the above.  Boneta Sharps, MD

## 2024-01-16 ENCOUNTER — Ambulatory Visit (INDEPENDENT_AMBULATORY_CARE_PROVIDER_SITE_OTHER): Admitting: Internal Medicine

## 2024-01-16 ENCOUNTER — Encounter: Payer: Self-pay | Admitting: Internal Medicine

## 2024-01-16 VITALS — BP 118/72 | Ht 60.0 in | Wt 197.0 lb

## 2024-01-16 DIAGNOSIS — M542 Cervicalgia: Secondary | ICD-10-CM | POA: Diagnosis not present

## 2024-01-16 DIAGNOSIS — N926 Irregular menstruation, unspecified: Secondary | ICD-10-CM | POA: Diagnosis not present

## 2024-01-16 DIAGNOSIS — M25512 Pain in left shoulder: Secondary | ICD-10-CM | POA: Diagnosis not present

## 2024-01-16 MED ORDER — TIZANIDINE HCL 4 MG PO TABS: 4.0000 mg | ORAL_TABLET | Freq: Three times a day (TID) | ORAL | 0 refills | Status: DC | PRN

## 2024-01-16 NOTE — Progress Notes (Signed)
 Subjective:    Patient ID: Ellen Kaiser, female    DOB: May 07, 1991, 33 y.o.   MRN: [id number]  HPI   Discussed the use of AI scribe software for clinical note transcription with the patient, who gave verbal consent to proceed.  Ellen Kaiser is a 33 year old female who presents with left shoulder and neck pain and irregular menstrual cycles.  She woke up on Friday with pain in her left shoulder, which worsened by Saturday morning, resulting in a 'terrible crook' in her neck on the left side. The pain is described as sore and achy, exacerbated by movement, and limits her ability to turn her head to the left and right. The pain is localized to the neck area without radiation to the arm. No numbness, tingling, or weakness in the left arm. No headaches or vision changes. She has been using aspirin and icy hot patches, which have provided some relief but have not improved her range of motion.  She is experiencing irregular menstrual cycles, with two periods occurring in each of the last two months. Prior to this, her periods were regular. She is not on birth control and denies any recent increase in stress levels. Her thyroid function was checked five months ago and was normal. She is not aware of any family history of early menopause.        Review of Systems  Past Medical History:  Diagnosis Date   Allergy    Anxiety    Asthma    Bipolar 1 disorder (HCC)    BV (bacterial vaginosis)    Depression    HSV-2 seropositive     Current Outpatient Medications  Medication Sig Dispense Refill   ABILIFY MAINTENA 400 MG PRSY prefilled syringe Inject into the muscle.     albuterol  (VENTOLIN  HFA) 108 (90 Base) MCG/ACT inhaler INHALE 1 PUFF INTO THE LUNGS EVERY 6 HOURS AS NEEDED FOR WHEEZING OR SHORTNESS OF BREATH. 8.5 each 0   ARISTADA 882 MG/3.2ML prefilled syringe SMARTSIG:3.2 Milliliter(s) IM Once a Month     busPIRone (BUSPAR) 10 MG tablet TAKE 1 TABLET BY MOUTH 2  TO 3 TIMES DIALY AS NEEDED FOR ANXIETY     Clindamycin -Benzoyl Per, Refr, gel Apply to the face QAM 45 g 11   clonazePAM (KLONOPIN) 1 MG tablet Take 1 tablet by mouth 2 (two) times daily.     doxycycline  (MONODOX ) 100 MG capsule TAKE 1 CAPSULE (100 MG TOTAL) BY MOUTH 2 (TWO) TIMES DAILY. TAKE WITH FOOD (Patient not taking: Reported on 01/10/2024) 60 capsule 2   hydrOXYzine (ATARAX/VISTARIL) 50 MG tablet Take 100 mg by mouth daily.      Secukinumab  (COSENTYX  UNOREADY) 300 MG/2ML SOAJ Inject 300 mg into the skin as directed. Inject 2 mLs (300 mg total) into the skin as directed. On week 0, 1, 2, 3 and 4. 10 mL 0   Secukinumab  (COSENTYX  UNOREADY) 300 MG/2ML SOAJ Inject 300 mg into the skin every 28 (twenty-eight) days. 2 mL 4   Secukinumab , 300 MG Dose, (COSENTYX  SENSOREADY, 300 MG,) 150 MG/ML SOAJ Inject 2 mLs (300 mg total) into the skin as directed. On week 0, 1, 2, 3 and 4. 10 mL 0   Secukinumab , 300 MG Dose, (COSENTYX  SENSOREADY, 300 MG,) 150 MG/ML SOAJ Inject 2 mLs (300 mg total) into the skin every 28 (twenty-eight) days. For maintenance. 2 mL 5   spironolactone  (ALDACTONE ) 50 MG tablet Take 1 tablet (50 mg total) by mouth daily. 7 tablet  0   tretinoin  (RETIN-A ) 0.025 % cream Apply topically at bedtime. Apply a thin coat to the face every other night as tolerated. 45 g 11   Current Facility-Administered Medications  Medication Dose Route Frequency Provider Last Rate Last Admin   Secukinumab  SOAJ 300 mg  300 mg Subcutaneous Q28 days Claudene Lehmann, MD   300 mg at 01/10/24 1117    Allergies  Allergen Reactions   Fish Allergy Anaphylaxis and Swelling    Family History  Problem Relation Age of Onset   Depression Mother    Alcohol abuse Mother    Bipolar disorder Mother    Healthy Father    Healthy Sister    Osteoarthritis Maternal Grandmother    Dementia Paternal Grandmother     Social History   Socioeconomic History   Marital status: Single    Spouse name: Not on file    Number of children: Not on file   Years of education: college   Highest education level: Not on file  Occupational History   Occupation: disabled    Comment: PTSD  Tobacco Use   Smoking status: Every Day    Current packs/day: 0.50    Average packs/day: 0.5 packs/day for 10.0 years (5.0 ttl pk-yrs)    Types: Cigarettes   Smokeless tobacco: Never  Vaping Use   Vaping status: Former   Substances: THC  Substance and Sexual Activity   Alcohol use: Yes    Alcohol/week: 3.0 standard drinks of alcohol    Types: 3 Cans of beer per week    Comment: 3 beers weekly   Drug use: Yes    Types: Marijuana   Sexual activity: Yes    Partners: Male    Birth control/protection: None    Comment: Mirena  Other Topics Concern   Not on file  Social History Narrative   Not on file   Social Drivers of Health   Financial Resource Strain: Low Risk  (09/16/2023)   Overall Financial Resource Strain (CARDIA)    Difficulty of Paying Living Expenses: Not hard at all  Food Insecurity: No Food Insecurity (09/16/2023)   Hunger Vital Sign    Worried About Running Out of Food in the Last Year: Never true    Ran Out of Food in the Last Year: Never true  Transportation Needs: No Transportation Needs (09/16/2023)   PRAPARE - Administrator, Civil Service (Medical): No    Lack of Transportation (Non-Medical): No  Physical Activity: Insufficiently Active (09/16/2023)   Exercise Vital Sign    Days of Exercise per Week: 2 days    Minutes of Exercise per Session: 20 min  Stress: No Stress Concern Present (09/16/2023)   [employer] of Occupational Health - Occupational Stress Questionnaire    Feeling of Stress : Only a little  Social Connections: Socially Isolated (09/16/2023)   Social Connection and Isolation Panel    Frequency of Communication with Friends and Family: More than three times a week    Frequency of Social Gatherings with Friends and Family: Never    Attends Religious Services: Never     Database administrator or Organizations: No    Attends Banker Meetings: Never    Marital Status: Never married  Intimate Partner Violence: Not At Risk (09/16/2023)   Humiliation, Afraid, Rape, and Kick questionnaire    Fear of Current or Ex-Partner: No    Emotionally Abused: No    Physically Abused: No    Sexually Abused: No  Constitutional: Patient reports intermittent headaches.  Denies fever, malaise, fatigue, or abrupt weight changes.  Respiratory: Denies difficulty breathing, shortness of breath, cough or sputum production.   Cardiovascular: Denies chest pain, chest tightness, palpitations or swelling in the hands or feet.  Gastrointestinal: Denies abdominal pain, bloating, constipation, diarrhea or blood in the stool.  GU: Pt reports irregular periods. Denies urgency, frequency, pain with urination, burning sensation, blood in urine, odor or discharge. Musculoskeletal: Patient reports left side neck and left shoulder pain, decrease range of motion.  Denies difficulty with gait, muscle pain or joint swelling.  Skin: Denies redness, rashes, lesions or ulcercations.  Neurological: Denies numbness, tingling, weakness of the left upper extremity or problems with balance and coordination.   No other specific complaints in a complete review of systems (except as listed in HPI above).     Objective:   Physical Exam  BP 118/72 (BP Location: Left Arm, Patient Position: Sitting, Cuff Size: Normal)   Ht 5' (1.524 m)   Wt 197 lb (89.4 kg)   LMP 01/16/2024 (Exact Date) Comment: spotting and had 2 periods in June and July  BMI 38.47 kg/m     Wt Readings from Last 3 Encounters:  10/27/23 191 lb 12.8 oz (87 kg)  07/28/23 191 lb 6.4 oz (86.8 kg)  07/14/23 190 lb 9.6 oz (86.5 kg)    General: Appears her stated age, obese, in NAD. Skin: Warm, dry and intact.  Acne noted on face. HEENT: Head: normal shape and size; Eyes: sclera white, no icterus, conjunctiva pink,  PERRLA and EOMs intact;  Cardiovascular: Normal rate and rhythm.  Pulmonary/Chest: Normal effort and positive vesicular breath sounds.  Musculoskeletal: Decreased flexion and rotation to the left of the cervical spine. Normal extension, rotation to the right and lateral bending of the cervical spine. No bony tenderness noted over the cervical spine. Pain with palpation of the left para cervical muscles. Shoulder shrug equal. Normal internal and external rotation of the left shoulder. No pain with palpation of the left shoulder. Strength 5/5 BUE. Hand grips equal. No difficulty with gait.  Neurological: Alert and oriented. Coordination normal.    BMET    Component Value Date/Time   NA 136 10/27/2023 1019   K 4.2 10/27/2023 1019   CL 108 10/27/2023 1019   CO2 24 10/27/2023 1019   GLUCOSE 84 10/27/2023 1019   BUN 10 10/27/2023 1019   CREATININE 0.92 10/27/2023 1019   CALCIUM 9.8 10/27/2023 1019   GFRNONAA >60 06/04/2023 0321   GFRNONAA 81 05/26/2020 0947   GFRAA 94 05/26/2020 0947    Lipid Panel     Component Value Date/Time   CHOL 176 10/27/2023 1019   TRIG 80 10/27/2023 1019   HDL 76 10/27/2023 1019   CHOLHDL 2.3 10/27/2023 1019   LDLCALC 83 10/27/2023 1019    CBC    Component Value Date/Time   WBC 6.7 10/27/2023 1019   RBC 4.38 10/27/2023 1019   HGB 14.0 10/27/2023 1019   HCT 42.5 10/27/2023 1019   PLT 268 10/27/2023 1019   MCV 97.0 10/27/2023 1019   MCH 32.0 10/27/2023 1019   MCHC 32.9 10/27/2023 1019   RDW 12.5 10/27/2023 1019   LYMPHSABS 2.1 06/04/2023 0321   MONOABS 0.6 06/04/2023 0321   EOSABS 22 07/14/2023 0905   BASOSABS 43 07/14/2023 0905    Hgb A1C Lab Results  Component Value Date   HGBA1C 5.4 10/27/2023            Assessment &  Plan:   Assessment and Plan    Left Side Neck/Shoulder Pain Acute left-sided neck pain likely due to muscle strain. Pain worsens with movement, no radiation to arm, no neurological symptoms. Ibuprofen recommended  for pain management, Zanaflex  for muscle relaxation. - Recommend ibuprofen 600 mg every 8 hours as needed for pain. - Prescribe zanaflex  4 mg every 8 hours as needed for muscle relaxation- sedation caution given. - Advise application of heat and massage to the affected area. - Recommend gentle stretching exercises to improve range of motion.  Menstrual Irregularity Experiencing two menstrual periods per month for two months. Previous cycles regular. Possible hormonal fluctuations or stress. Consider birth control if irregularity persists. - Monitor menstrual cycle over the next few months. - Discuss potential use of birth control for regulation if irregularity continues. - Re-evaluate menstrual irregularity at the next appointment in November.      RTC in 4 months, follow-up chronic conditions Angeline Laura, NP

## 2024-01-16 NOTE — Patient Instructions (Signed)
 Neck Exercises Ask your health care provider which exercises are safe for you. Do exercises exactly as told by your health care provider and adjust them as directed. It is normal to feel mild stretching, pulling, tightness, or discomfort as you do these exercises. Stop right away if you feel sudden pain or your pain gets worse. Do not begin these exercises until told by your health care provider. Neck exercises can be important for many reasons. They can improve strength and maintain flexibility in your neck, which will help your upper back and prevent neck pain. Stretching exercises Rotation neck stretching  Sit in a chair or stand up. Place your feet flat on the floor, shoulder-width apart. Slowly turn your head (rotate) to the right until a slight stretch is felt. Turn it all the way to the right so you can look over your right shoulder. Do not tilt or tip your head. Hold this position for 10-30 seconds. Slowly turn your head (rotate) to the left until a slight stretch is felt. Turn it all the way to the left so you can look over your left shoulder. Do not tilt or tip your head. Hold this position for 10-30 seconds. Repeat __________ times. Complete this exercise __________ times a day. Neck retraction  Sit in a sturdy chair or stand up. Look straight ahead. Do not bend your neck. Use your fingers to push your chin backward (retraction). Do not bend your neck for this movement. Continue to face straight ahead. If you are doing the exercise properly, you will feel a slight sensation in your throat and a stretch at the back of your neck. Hold the stretch for 1-2 seconds. Repeat __________ times. Complete this exercise __________ times a day. Strengthening exercises Neck press  Lie on your back on a firm bed or on the floor with a pillow under your head. Use your neck muscles to push your head down on the pillow and straighten your spine. Hold the position as well as you can. Keep your head  facing up (in a neutral position) and your chin tucked. Slowly count to 5 while holding this position. Repeat __________ times. Complete this exercise __________ times a day. Isometrics These are exercises in which you strengthen the muscles in your neck while keeping your neck still (isometrics). Sit in a supportive chair and place your hand on your forehead. Keep your head and face facing straight ahead. Do not flex or extend your neck while doing isometrics. Push forward with your head and neck while pushing back with your hand. Hold for 10 seconds. Do the sequence again, this time putting your hand against the back of your head. Use your head and neck to push backward against the hand pressure. Finally, do the same exercise on either side of your head, pushing sideways against the pressure of your hand. Repeat __________ times. Complete this exercise __________ times a day. Prone head lifts  Lie face-down (prone position), resting on your elbows so that your chest and upper back are raised. Start with your head facing downward, near your chest. Position your chin either on or near your chest. Slowly lift your head upward. Lift until you are looking straight ahead. Then continue lifting your head as far back as you can comfortably stretch. Hold your head up for 5 seconds. Then slowly lower it to your starting position. Repeat __________ times. Complete this exercise __________ times a day. Supine head lifts  Lie on your back (supine position), bending your knees  to point to the ceiling and keeping your feet flat on the floor. Lift your head slowly off the floor, raising your chin toward your chest. Hold for 5 seconds. Repeat __________ times. Complete this exercise __________ times a day. Scapular retraction  Stand with your arms at your sides. Look straight ahead. Slowly pull both shoulders (scapulae) backward and downward (retraction) until you feel a stretch between your shoulder  blades in your upper back. Hold for 10-30 seconds. Relax and repeat. Repeat __________ times. Complete this exercise __________ times a day. Contact a health care provider if: Your neck pain or discomfort gets worse when you do an exercise. Your neck pain or discomfort does not improve within 2 hours after you exercise. If you have any of these problems, stop exercising right away. Do not do the exercises again unless your health care provider says that you can. Get help right away if: You develop sudden, severe neck pain. If this happens, stop exercising right away. Do not do the exercises again unless your health care provider says that you can. This information is not intended to replace advice given to you by your health care provider. Make sure you discuss any questions you have with your health care provider. Document Revised: 12/02/2020 Document Reviewed: 12/02/2020 Elsevier Patient Education  2024 [employer].

## 2024-02-07 ENCOUNTER — Ambulatory Visit

## 2024-02-07 DIAGNOSIS — L732 Hidradenitis suppurativa: Secondary | ICD-10-CM

## 2024-02-07 NOTE — Progress Notes (Signed)
 Patient here today for Cosentyx  injection for Hidradenitis Suppurativa.    Cosentyx  300mg /86mL injected into right upper arm. Patient tolerated injection well.    LOT: [id number] EXP: 06/20/2025 NDC: 9921-8929-31   Alan Pizza, RMA

## 2024-02-21 ENCOUNTER — Other Ambulatory Visit: Payer: Self-pay

## 2024-02-21 MED ORDER — COSENTYX UNOREADY 300 MG/2ML ~~LOC~~ SOAJ
300.0000 mg | SUBCUTANEOUS | 4 refills | Status: AC
Start: 2024-02-21 — End: ?

## 2024-03-06 ENCOUNTER — Ambulatory Visit (INDEPENDENT_AMBULATORY_CARE_PROVIDER_SITE_OTHER)

## 2024-03-06 DIAGNOSIS — L732 Hidradenitis suppurativa: Secondary | ICD-10-CM

## 2024-03-06 MED ORDER — SECUKINUMAB 300 MG/2ML ~~LOC~~ SOAJ
300.0000 mg | Freq: Once | SUBCUTANEOUS | Status: AC
  Administered 2024-03-06: 300 mg via SUBCUTANEOUS

## 2024-03-06 NOTE — Progress Notes (Signed)
 Patient here today for Cosentyx  injection for Hidradenitis Suppurativa.    Cosentyx  300mg /61mL injected into left upper arm. Patient tolerated injection well.    LOT: [id number] EXP: 06/20/2025 NDC: 9921-8929-31  New order placed, original order was for Cosentyx  150mg .    Alan Pizza, RMA

## 2024-04-03 ENCOUNTER — Encounter: Payer: Self-pay | Admitting: Dermatology

## 2024-04-03 ENCOUNTER — Ambulatory Visit: Admitting: Dermatology

## 2024-04-03 DIAGNOSIS — L709 Acne, unspecified: Secondary | ICD-10-CM | POA: Diagnosis not present

## 2024-04-03 DIAGNOSIS — L810 Postinflammatory hyperpigmentation: Secondary | ICD-10-CM

## 2024-04-03 DIAGNOSIS — Z79899 Other long term (current) drug therapy: Secondary | ICD-10-CM | POA: Diagnosis not present

## 2024-04-03 DIAGNOSIS — Z7189 Other specified counseling: Secondary | ICD-10-CM

## 2024-04-03 DIAGNOSIS — L732 Hidradenitis suppurativa: Secondary | ICD-10-CM | POA: Diagnosis not present

## 2024-04-03 DIAGNOSIS — L700 Acne vulgaris: Secondary | ICD-10-CM

## 2024-04-03 NOTE — Progress Notes (Signed)
   Follow Up Visit   Subjective  Ellen Kaiser is a 33 y.o. female who presents for the following: Hidradenitis   Patient on Cosentyx , improved per patient with no side effects.  The following portions of the chart were reviewed this encounter and updated as appropriate: medications, allergies, medical history  Review of Systems:  No other skin or systemic complaints except as noted in HPI or Assessment and Plan.  Objective  Well appearing patient in no apparent distress; mood and affect are within normal limits.  A focused examination was performed of the following areas:  face  Relevant exam findings are noted in the Assessment and Plan.    Assessment & Plan   HIDRADENITIS SUPPURATIVA   Related Medications Secukinumab  SOAJ 300 mg  LONG-TERM USE OF HIGH-RISK MEDICATION   COUNSELING AND COORDINATION OF CARE   MEDICATION MANAGEMENT   ACNE VULGARIS   Related Medications tretinoin  (RETIN-A ) 0.025 % cream Apply topically at bedtime. Apply a thin coat to the face every other night as tolerated. POSTINFLAMMATORY HYPERPIGMENTATION   ACNE WITH HYPERPIGMENTATION Exam: open comedones scattered on forehead glabella temples, postinflammatory hyperpigmented macules on cheeks forehead  Chronic flared not at goal  Treatment Plan: Continue tretinoin  0.025% cr at bedtime as tolerated Recommend OTC Inkey Tranexamic Acid cream to apply to hyperpigmented areas on face at night.  Can buy at [employer], [employer], or online for around $15.   HIDRADENITIS SUPPURATIVA Exam: deferred, no lesions  Well-controlled  Hidradenitis Suppurativa is a chronic; persistent; non-curable, but treatable condition due to abnormal inflamed sweat glands in the body folds (axilla, inframammary, groin, medial thighs), causing recurrent painful draining cysts and scarring. It can be associated with severe scarring acne and cysts; also abscesses and scarring of scalp. The goal is control and  prevention of flares, as it is not curable. Scars are permanent and can be thickened. Treatment may include daily use of topical medication and oral antibiotics.  Oral isotretinoin may also be helpful.  For some cases, Humira or Cosentyx  (biologic injections) may be prescribed to decrease the inflammatory process and prevent flares.  When indicated, inflamed cysts may also be treated surgically.  Treatment Plan: Continue Cosentyx  300mg /46mL SQ Q4 weeks.  Cosentyx  300mg /75mL injected today to right upper arm. Patient tolerated well.  Lot # [id number]  Exp: 07/2026  Patient due for labs in January. Will send patient msg via MyChart.  Long term medication management.  Patient is using long term (months to years) prescription medication  to control their dermatologic condition.  These medications require periodic monitoring to evaluate for efficacy and side effects and may require periodic laboratory monitoring.   Return in about 4 weeks (around 05/01/2024) for with nurse, 6 months with Dr. Claudene.  LILLETTE Lonell Drones, RMA, am acting as scribe for Boneta Claudene, MD .   Documentation: I have reviewed the above documentation for accuracy and completeness, and I agree with the above.  Boneta Claudene, MD

## 2024-04-03 NOTE — Patient Instructions (Addendum)
 Recommend OTC Inkey Tranexamic Acid cream to apply to hyperpigmented areas on face at night.  Can buy at [facility], [employer], or online for around $15.   Due to recent changes in healthcare laws, you may see results of your pathology and/or laboratory studies on MyChart before the doctors have had a chance to review them. We understand that in some cases there may be results that are confusing or concerning to you. Please understand that not all results are received at the same time and often the doctors may need to interpret multiple results in order to provide you with the best plan of care or course of treatment. Therefore, we ask that you please give us  2 business days to thoroughly review all your results before contacting the office for clarification. Should we see a critical lab result, you will be contacted sooner.   If You Need Anything After Your Visit  If you have any questions or concerns for your doctor, please call our main line at [phone] and press option 4 to reach your doctor's medical assistant. If no one answers, please leave a voicemail as directed and we will return your call as soon as possible. Messages left after 4 pm will be answered the following business day.   You may also send us  a message via MyChart. We typically respond to MyChart messages within 1-2 business days.  For prescription refills, please ask your pharmacy to contact our office. Our fax number is [phone].  If you have an urgent issue when the clinic is closed that cannot wait until the next business day, you can page your doctor at the number below.    Please note that while we do our best to be available for urgent issues outside of office hours, we are not available 24/7.   If you have an urgent issue and are unable to reach us , you may choose to seek medical care at your doctor's office, retail clinic, urgent care center, or emergency room.  If you have a medical emergency, please immediately call  911 or go to the emergency department.  Pager Numbers  - Dr. Hester: [phone]  - Dr. Jackquline: [phone]  - Dr. Claudene: [phone]   - Dr. Raymund: [phone]  In the event of inclement weather, please call our main line at [phone] for an update on the status of any delays or closures.  Dermatology Medication Tips: Please keep the boxes that topical medications come in in order to help keep track of the instructions about where and how to use these. Pharmacies typically print the medication instructions only on the boxes and not directly on the medication tubes.   If your medication is too expensive, please contact our office at [phone] option 4 or send us  a message through MyChart.   We are unable to tell what your co-pay for medications will be in advance as this is different depending on your insurance coverage. However, we may be able to find a substitute medication at lower cost or fill out paperwork to get insurance to cover a needed medication.   If a prior authorization is required to get your medication covered by your insurance company, please allow us  1-2 business days to complete this process.  Drug prices often vary depending on where the prescription is filled and some pharmacies may offer cheaper prices.  The website www.goodrx.com contains coupons for medications through different pharmacies. The prices here do not account for what the cost may be with help from insurance (  it may be cheaper with your insurance), but the website can give you the price if you did not use any insurance.  - You can print the associated coupon and take it with your prescription to the pharmacy.  - You may also stop by our office during regular business hours and pick up a GoodRx coupon card.  - If you need your prescription sent electronically to a different pharmacy, notify our office through [facility] or by phone at [phone] option 4.     Si Usted  Necesita Algo Despus de Su Visita  Tambin puede enviarnos un mensaje a travs de Clinical cytogeneticist. Por lo general respondemos a los mensajes de MyChart en el transcurso de 1 a 2 das hbiles.  Para renovar recetas, por favor pida a su farmacia que se ponga en contacto con nuestra oficina. Randi lakes de fax es [place] [phone].  Si tiene un asunto urgente cuando la clnica est cerrada y que no puede esperar hasta el siguiente da hbil, puede llamar/localizar a su doctor(a) al nmero que aparece a continuacin.   Por favor, tenga en cuenta que aunque hacemos todo lo posible para estar disponibles para asuntos urgentes fuera del horario de [place], no estamos disponibles las 24 horas del da, los 7 [address]  [address] de la [place].   Si tiene un problema urgente y no puede comunicarse con nosotros, puede optar por buscar atencin mdica  en el consultorio de su doctor(a), en una clnica privada, en un centro de atencin urgente o en una sala de emergencias.  Si tiene Engineer, drilling, por favor llame inmediatamente al 911 o vaya a la sala de emergencias.  Nmeros de bper  - Dr. Hester: [phone]  - Dra. Jackquline: [phone]  - Dr. Claudene: [phone]  - Dra. Kitts: [phone]  En caso de inclemencias del [place], por favor llame a nuestra lnea principal al [phone] para una actualizacin sobre el estado de cualquier retraso o cierre.  Consejos para la medicacin en dermatologa: Por favor, guarde las cajas en las que vienen los medicamentos de uso tpico para ayudarle a seguir las instrucciones sobre dnde y cmo usarlos. Las farmacias generalmente imprimen las instrucciones del medicamento slo en las cajas y no directamente en los tubos del [place].   Si su medicamento es muy caro, por favor, pngase en contacto con landry rieger llamando al [phone] y presione la opcin 4 o envenos un mensaje a travs de Clinical cytogeneticist.   No podemos decirle cul ser su copago por los medicamentos  por adelantado ya que esto es diferente dependiendo de la cobertura de su seguro. Sin embargo, es posible que podamos encontrar un medicamento sustituto a Audiological scientist un formulario para que el seguro cubra el medicamento que se considera necesario.   Si se requiere una autorizacin previa para que su compaa de seguros malta su medicamento, por favor permtanos de 1 a 2 das hbiles para completar este proceso.  Los precios de los medicamentos varan con frecuencia dependiendo del Environmental consultant de dnde se surte la receta y alguna farmacias pueden ofrecer precios ms baratos.  El sitio web www.goodrx.com tiene cupones para medicamentos de Health and safety inspector. Los precios aqu no tienen en cuenta lo que podra costar con la ayuda del seguro (puede ser ms barato con su seguro), pero el sitio web puede darle el precio si no utiliz Tourist information centre manager.  - Puede imprimir el cupn correspondiente y llevarlo con su receta a la farmacia.  - Tambin puede pasar por nuestra oficina  durante el horario de atencin regular y Education officer, museum una tarjeta de cupones de GoodRx.  - Si necesita que su receta se enve electrnicamente a una farmacia diferente, informe a nuestra oficina a travs de MyChart de [facility] o por telfono llamando al [phone] y presione la opcin 4.

## 2024-05-01 ENCOUNTER — Other Ambulatory Visit (HOSPITAL_COMMUNITY)
Admission: RE | Admit: 2024-05-01 | Discharge: 2024-05-01 | Disposition: A | Discharge status: Home / Self Care | Source: Ambulatory Visit | Attending: Internal Medicine | Admitting: Internal Medicine

## 2024-05-01 ENCOUNTER — Encounter: Payer: Self-pay | Admitting: Internal Medicine

## 2024-05-01 ENCOUNTER — Ambulatory Visit (INDEPENDENT_AMBULATORY_CARE_PROVIDER_SITE_OTHER): Admitting: Internal Medicine

## 2024-05-01 VITALS — BP 120/78 | Ht 60.0 in | Wt 195.2 lb

## 2024-05-01 DIAGNOSIS — Z113 Encounter for screening for infections with a predominantly sexual mode of transmission: Secondary | ICD-10-CM | POA: Diagnosis present

## 2024-05-01 DIAGNOSIS — G43009 Migraine without aura, not intractable, without status migrainosus: Secondary | ICD-10-CM | POA: Diagnosis not present

## 2024-05-01 DIAGNOSIS — F319 Bipolar disorder, unspecified: Secondary | ICD-10-CM

## 2024-05-01 DIAGNOSIS — Z23 Encounter for immunization: Secondary | ICD-10-CM

## 2024-05-01 DIAGNOSIS — E66812 Obesity, class 2: Secondary | ICD-10-CM

## 2024-05-01 DIAGNOSIS — L732 Hidradenitis suppurativa: Secondary | ICD-10-CM | POA: Diagnosis not present

## 2024-05-01 DIAGNOSIS — L700 Acne vulgaris: Secondary | ICD-10-CM | POA: Insufficient documentation

## 2024-05-01 DIAGNOSIS — M542 Cervicalgia: Secondary | ICD-10-CM

## 2024-05-01 DIAGNOSIS — Z6838 Body mass index (BMI) 38.0-38.9, adult: Secondary | ICD-10-CM

## 2024-05-01 DIAGNOSIS — R7689 Other specified abnormal immunological findings in serum: Secondary | ICD-10-CM

## 2024-05-01 DIAGNOSIS — F411 Generalized anxiety disorder: Secondary | ICD-10-CM

## 2024-05-01 MED ORDER — AIRSUPRA 90-80 MCG/ACT IN AERO: 2.0000 | INHALATION_SPRAY | RESPIRATORY_TRACT | 1 refills | Status: AC | PRN

## 2024-05-01 MED ORDER — TIZANIDINE HCL 4 MG PO TABS: 4.0000 mg | ORAL_TABLET | Freq: Three times a day (TID) | ORAL | 0 refills | Status: AC | PRN

## 2024-05-01 NOTE — Assessment & Plan Note (Signed)
 Complicated by morbid obesity Avoid foods or stress that trigger reflux Encourage weight loss as this can help reduce reflux symptoms Okay to take tums OTC as needed

## 2024-05-01 NOTE — Assessment & Plan Note (Signed)
 Will discontinue albuterol  and start airsupra 90-80 mcg per actuation every 4-6 hours as needed Encouraged smoking cessation

## 2024-05-01 NOTE — Assessment & Plan Note (Signed)
 Avoid triggers Continue tylenol  as needed

## 2024-05-01 NOTE — Assessment & Plan Note (Signed)
 Continue abilify 5 mg daily and 400 mg monthly injections, hydroxyzine 100 mg daily, clonazepam 1 mg twice daily and buspirone 10 mg 3 times daily as needed per psychiatry Support offered

## 2024-05-01 NOTE — Assessment & Plan Note (Signed)
 Continue spironolactone  50 mg daily She will continue to follow with dermatology

## 2024-05-01 NOTE — Assessment & Plan Note (Signed)
 Encouraged diet and exercise for weight loss ?

## 2024-05-01 NOTE — Progress Notes (Signed)
 Subjective:    Patient ID: Ellen Kaiser, female    DOB: [birth year], 33 y.o.   MRN: [id number]  HPI  Patient presents to clinic today for 57-month follow-up of chronic conditions.  Migraines: These occur rarely.  She is not sure what triggers them.  She takes tylenol  as needed with good relief of symptoms.  She does not follow with neurology.  Asthma: She denies chronic cough but she does have some shortness of breath with inclement weather.  She uses albuterol  only as needed with good relief of symptoms.  There are no PFTs on file.  She does smoke.  GERD: Triggered by stress.  She does not take any medication for this.  There is no upper GI on file.  Hidradenitis suppurativa: Managed with secukinumab .  She does not follow with dermatology.  Anxiety and bipolar depression: Chronic, managed on abilify, hydroxyzine, clonazepam and buspirone.  She is currently seeing a therapist and a psychiatrist.  She denies SI/HI.  Genital herpes: She denies recent outbreak.  She is not taking any preventative antiviral medication at this time.  Cystic acne: Managed with spironolactone .  She follows with dermatology.  She would like STD screening today.  She has a new partner.  She is not currently having any symptoms.  Review of Systems     Past Medical History:  Diagnosis Date   Allergy    Anxiety    Asthma    Bipolar 1 disorder (HCC)    BV (bacterial vaginosis)    Depression    HSV-2 seropositive     Current Outpatient Medications  Medication Sig Dispense Refill   ABILIFY MAINTENA 400 MG PRSY prefilled syringe Inject into the muscle.     albuterol  (VENTOLIN  HFA) 108 (90 Base) MCG/ACT inhaler INHALE 1 PUFF INTO THE LUNGS EVERY 6 HOURS AS NEEDED FOR WHEEZING OR SHORTNESS OF BREATH. 8.5 each 0   ARISTADA 882 MG/3.2ML prefilled syringe SMARTSIG:3.2 Milliliter(s) IM Once a Month     busPIRone (BUSPAR) 10 MG tablet TAKE 1 TABLET BY MOUTH 2 TO 3 TIMES DIALY AS NEEDED FOR ANXIETY      clonazePAM (KLONOPIN) 1 MG tablet Take 1 tablet by mouth 2 (two) times daily.     hydrOXYzine (ATARAX/VISTARIL) 50 MG tablet Take 100 mg by mouth daily.      Secukinumab  (COSENTYX  UNOREADY) 300 MG/2ML SOAJ Inject 300 mg into the skin every 28 (twenty-eight) days. 2 mL 4   spironolactone  (ALDACTONE ) 50 MG tablet Take 1 tablet (50 mg total) by mouth daily. 7 tablet 0   tiZANidine  (ZANAFLEX ) 4 MG tablet Take 1 tablet (4 mg total) by mouth every 8 (eight) hours as needed for muscle spasms. 30 tablet 0   tretinoin  (RETIN-A ) 0.025 % cream Apply topically at bedtime. Apply a thin coat to the face every other night as tolerated. 45 g 11   Current Facility-Administered Medications  Medication Dose Route Frequency Provider Last Rate Last Admin   Secukinumab  SOAJ 300 mg  300 mg Subcutaneous Q28 days Smith, Collin-Jamal, MD   300 mg at 04/03/24 1048    Allergies  Allergen Reactions   Fish Allergy Anaphylaxis and Swelling    Family History  Problem Relation Age of Onset   Depression Mother    Alcohol abuse Mother    Bipolar disorder Mother    Healthy Father    Healthy Sister    Osteoarthritis Maternal Grandmother    Dementia Paternal Grandmother     Social History   Socioeconomic History  Marital status: Single    Spouse name: Not on file   Number of children: Not on file   Years of education: college   Highest education level: Not on file  Occupational History   Occupation: disabled    Comment: PTSD  Tobacco Use   Smoking status: Every Day    Current packs/day: 0.50    Average packs/day: 0.5 packs/day for 10.0 years (5.0 ttl pk-yrs)    Types: Cigarettes   Smokeless tobacco: Never  Vaping Use   Vaping status: Former   Substances: THC  Substance and Sexual Activity   Alcohol use: Yes    Alcohol/week: 3.0 standard drinks of alcohol    Types: 3 Cans of beer per week    Comment: 3 beers weekly   Drug use: Yes    Types: Marijuana   Sexual activity: Yes    Partners: Male     Birth control/protection: None    Comment: Mirena  Other Topics Concern   Not on file  Social History Narrative   Not on file   Social Drivers of Health   Financial Resource Strain: Low Risk  (09/16/2023)   Overall Financial Resource Strain (CARDIA)    Difficulty of Paying Living Expenses: Not hard at all  Food Insecurity: No Food Insecurity (09/16/2023)   Hunger Vital Sign    Worried About Running Out of Food in the Last Year: Never true    Ran Out of Food in the Last Year: Never true  Transportation Needs: No Transportation Needs (09/16/2023)   PRAPARE - Administrator, Civil Service (Medical): No    Lack of Transportation (Non-Medical): No  Physical Activity: Insufficiently Active (09/16/2023)   Exercise Vital Sign    Days of Exercise per Week: 2 days    Minutes of Exercise per Session: 20 min  Stress: No Stress Concern Present (09/16/2023)   [employer] of Occupational Health - Occupational Stress Questionnaire    Feeling of Stress : Only a little  Social Connections: Socially Isolated (09/16/2023)   Social Connection and Isolation Panel    Frequency of Communication with Friends and Family: More than three times a week    Frequency of Social Gatherings with Friends and Family: Never    Attends Religious Services: Never    Database Administrator or Organizations: No    Attends Banker Meetings: Never    Marital Status: Never married  Intimate Partner Violence: Not At Risk (09/16/2023)   Humiliation, Afraid, Rape, and Kick questionnaire    Fear of Current or Ex-Partner: No    Emotionally Abused: No    Physically Abused: No    Sexually Abused: No     Constitutional: Patient reports intermittent headaches.  Denies fever, malaise, fatigue, or abrupt weight changes.  HEENT: Denies eye pain, eye redness, ear pain, ringing in the ears, wax buildup, runny nose, nasal congestion, bloody nose, or sore throat. Respiratory: Patient reports intermittent  shortness of breath.  Denies difficulty breathing, cough or sputum production.   Cardiovascular: Denies chest pain, chest tightness, palpitations or swelling in the hands or feet.  Gastrointestinal: Denies abdominal pain, bloating, constipation, diarrhea or blood in the stool.  GU: Denies urgency, frequency, pain with urination, burning sensation, blood in urine, odor or discharge. Musculoskeletal: Patient reports intermittent neck pain.  Denies decrease in range of motion, difficulty with gait, or joint pain and swelling.  Skin: Denies redness, rashes, lesions or ulcercations.  Neurological: Denies dizziness, difficulty with memory, difficulty with  speech or problems with balance and coordination.  Psych: Patient has a history of anxiety and depression.  Denies SI/HI.  No other specific complaints in a complete review of systems (except as listed in HPI above).  Objective:   Physical Exam  BP 120/78 (BP Location: Right Arm, Patient Position: Sitting, Cuff Size: Normal)   Ht 5' (1.524 m)   Wt 195 lb 3.2 oz (88.5 kg)   LMP 04/06/2024 (Exact Date)   BMI 38.12 kg/m    Wt Readings from Last 3 Encounters:  01/16/24 197 lb (89.4 kg)  10/27/23 191 lb 12.8 oz (87 kg)  07/28/23 191 lb 6.4 oz (86.8 kg)    General: Appears her stated age, obese, in NAD. Skin: Warm, dry and intact.  Acne improving. HEENT: Head: normal shape and size; Eyes: sclera white, no icterus, conjunctiva pink, PERRLA and EOMs intact;  Cardiovascular: Normal rate and rhythm. S1,S2 noted.  No murmur, rubs or gallops noted. No JVD or BLE edema.  Pulmonary/Chest: Normal effort and positive vesicular breath sounds. No respiratory distress. No wheezes, rales or ronchi noted.  Abdomen: Soft and nontender. Normal bowel sounds.  Musculoskeletal: Normal flexion, extension rotation of the cervical spine.  No difficulty with gait.  Neurological: Alert and oriented. Cranial nerves II-XII grossly intact. Coordination normal.   Psychiatric: Mood and affect normal. Behavior is normal. Judgment and thought content normal.    BMET    Component Value Date/Time   NA 136 10/27/2023 1019   K 4.2 10/27/2023 1019   CL 108 10/27/2023 1019   CO2 24 10/27/2023 1019   GLUCOSE 84 10/27/2023 1019   BUN 10 10/27/2023 1019   CREATININE 0.92 10/27/2023 1019   CALCIUM 9.8 10/27/2023 1019   GFRNONAA >60 06/04/2023 0321   GFRNONAA 81 05/26/2020 0947   GFRAA 94 05/26/2020 0947    Lipid Panel     Component Value Date/Time   CHOL 176 10/27/2023 1019   TRIG 80 10/27/2023 1019   HDL 76 10/27/2023 1019   CHOLHDL 2.3 10/27/2023 1019   LDLCALC 83 10/27/2023 1019    CBC    Component Value Date/Time   WBC 6.7 10/27/2023 1019   RBC 4.38 10/27/2023 1019   HGB 14.0 10/27/2023 1019   HCT 42.5 10/27/2023 1019   PLT 268 10/27/2023 1019   MCV 97.0 10/27/2023 1019   MCH 32.0 10/27/2023 1019   MCHC 32.9 10/27/2023 1019   RDW 12.5 10/27/2023 1019   LYMPHSABS 2.1 06/04/2023 0321   MONOABS 0.6 06/04/2023 0321   EOSABS 22 07/14/2023 0905   BASOSABS 43 07/14/2023 0905    Hgb A1C Lab Results  Component Value Date   HGBA1C 5.4 10/27/2023           Assessment & Plan:   Neck pain:  Encouraged her to get new pillows as this may help decrease her neck pain Refilled tizanidine  4 mg 3 times daily as needed-sedation caution given Encouraged regular stretching and neck exercises  Screen for STD:  Will check gonorrhea, chlamydia, trichomonas, HIV, RPR and hep C today RTC in 6 months for your annual exam Angeline Laura, NP

## 2024-05-01 NOTE — Patient Instructions (Signed)
 Migraine Headache A migraine headache is a very strong throbbing pain on one or both sides of your head. This type of headache can also cause other symptoms. It can last from 4 hours to 3 days. Talk with your doctor about what things may bring on (trigger) this condition. What are the causes? The exact cause of a migraine is not known. This condition may be brought on or caused by: Smoking. Medicines, such as: Medicine used to treat chest pain (nitroglycerin). Birth control pills. Estrogen. Some blood pressure medicines. Certain substances in some foods or drinks. Foods and drinks, such as: Cheese. Chocolate. Alcohol. Caffeine. Doing physical activity that is very hard. Other things that may trigger a migraine headache include: Periods. Pregnancy. Hunger. Stress. Getting too much or too little sleep. Weather changes. Feeling tired (fatigue). What increases the risk? Being 18-65 years old. Being female. Having a family history of migraine headaches. Being Caucasian. Having a mental health condition, such as being sad (depressed) or feeling worried or nervous (anxious). Being very overweight (obese). What are the signs or symptoms? A throbbing pain. This pain may: Happen in any area of the head, such as on one or both sides. Make it hard to do daily activities. Get worse with physical activity. Get worse around bright lights, loud noises, or smells. Other symptoms may include: Feeling like you may vomit (nauseous). Vomiting. Dizziness. Before a migraine headache starts, you may get warning signs (an aura). An aura may include: Seeing flashing lights or having blind spots. Seeing bright spots, halos, or zigzag lines. Having tunnel vision or blurred vision. Having numbness or a tingling feeling. Having trouble talking. Having weak muscles. After a migraine ends, you may have symptoms. These may include: Tiredness. Trouble thinking (concentrating). How is this  treated? Taking medicines that: Relieve pain. Relieve the feeling like you may vomit. Prevent migraine headaches. Treatment may also include: Acupuncture. Lifestyle changes like avoiding foods that bring on migraine headaches. Learning ways to control your body functions (biofeedback). Therapy to help you know and deal with negative thoughts (cognitive behavioral therapy). Follow these instructions at home: Medicines Take over-the-counter and prescription medicines only as told by your doctor. If told, take steps to prevent problems with pooping (constipation). You may need to: Drink enough fluid to keep your pee (urine) pale yellow. Take medicines. You will be told what medicines to take. Eat foods that are high in fiber. These include beans, whole grains, and fresh fruits and vegetables. Limit foods that are high in fat and sugar. These include fried or sweet foods. Ask your doctor if you should avoid driving or using machines while you are taking your medicine. Lifestyle  Do not drink alcohol. Do not smoke or use any products that contain nicotine or tobacco. If you need help quitting, ask your doctor. Get 7-9 hours of sleep each night, or the amount recommended by your doctor. Find ways to deal with stress, such as meditation, deep breathing, or yoga. Try to exercise often. This can help lessen how bad and how often your migraines happen. General instructions Keep a journal to find out what may bring on your migraine headaches. This can help you avoid those things. For example, write down: What you eat and drink. How much sleep you get. Any change to your medicines or diet. If you have a migraine headache: Avoid things that make your symptoms worse, such as bright lights. Lie down in a dark, quiet room. Do not drive or use machinery. Ask your  doctor what activities are safe for you. Where to find more information Coalition for Headache and Migraine Patients (CHAMP):  headachemigraine.org American Migraine Foundation: americanmigrainefoundation.org National Headache Foundation: headaches.org Contact a doctor if: You get a migraine headache that is different or worse than others you have had. You have more than 15 days of headaches in one month. Get help right away if: Your migraine headache gets very bad. Your migraine headache lasts more than 72 hours. You have a fever or stiff neck. You have trouble seeing. Your muscles feel weak or like you cannot control them. You lose your balance a lot. You have trouble walking. You faint. You have a seizure. This information is not intended to replace advice given to you by your health care provider. Make sure you discuss any questions you have with your health care provider. Document Revised: 02/01/2022 Document Reviewed: 02/01/2022 Elsevier Patient Education  2024 [employer].

## 2024-05-01 NOTE — Assessment & Plan Note (Signed)
Not having frequent flares Not on preventative antiviral therapy

## 2024-05-01 NOTE — Assessment & Plan Note (Signed)
 Continue secukinumab  300 mg monthly injections She will continue to follow with dermatology

## 2024-05-02 ENCOUNTER — Ambulatory Visit (INDEPENDENT_AMBULATORY_CARE_PROVIDER_SITE_OTHER)

## 2024-05-02 ENCOUNTER — Other Ambulatory Visit: Payer: Self-pay | Admitting: Internal Medicine

## 2024-05-02 DIAGNOSIS — L732 Hidradenitis suppurativa: Secondary | ICD-10-CM | POA: Diagnosis not present

## 2024-05-02 DIAGNOSIS — Z113 Encounter for screening for infections with a predominantly sexual mode of transmission: Secondary | ICD-10-CM

## 2024-05-02 LAB — RPR: RPR Ser Ql: NONREACTIVE

## 2024-05-02 LAB — HIV ANTIBODY (ROUTINE TESTING W REFLEX)
HIV 1&2 Ab, 4th Generation: NONREACTIVE
HIV FINAL INTERPRETATION: NEGATIVE

## 2024-05-02 LAB — HEPATITIS C ANTIBODY: Hepatitis C Ab: NONREACTIVE

## 2024-05-02 MED ORDER — SECUKINUMAB 300 MG/2ML ~~LOC~~ SOAJ
300.0000 mg | SUBCUTANEOUS | Status: AC
  Administered 2024-05-02 – 2024-07-24 (×4): 300 mg via SUBCUTANEOUS

## 2024-05-02 NOTE — Progress Notes (Signed)
 Patient here today for Cosentyx  injection for Hidradenitis Suppurativa.    Cosentyx  300mg /9mL injected into left upper arm. Patient tolerated injection well.    LOT: [id number] EXP: 08/18/2025 NDC: 9921-8929-31   Alan Pizza, RMA

## 2024-05-03 ENCOUNTER — Ambulatory Visit: Payer: Self-pay | Admitting: Internal Medicine

## 2024-05-03 LAB — CERVICOVAGINAL ANCILLARY ONLY
Chlamydia: NEGATIVE
Comment: NEGATIVE
Comment: NEGATIVE
Comment: NORMAL
Neisseria Gonorrhea: NEGATIVE
Trichomonas: NEGATIVE

## 2024-05-04 NOTE — Telephone Encounter (Signed)
 Requested medication (s) are due for refill today: yes  Requested medication (s) are on the active medication list: yes  Last refill:  05/01/24  Future visit scheduled: yes  Notes to clinic:  harmacy comment: Script Clarification:HOW OFTEN USING INHALE IN A DAY.      Requested Prescriptions  Pending Prescriptions Disp Refills   AIRSUPRA 90-80 MCG/ACT AERO [Pharmacy Med Name: AIRSUPRA 90-80 MCG INHALER]  1    Sig: Inhale 2 Inhalations into the lungs as needed (wheezing, shortness of breath, cough).     Off-Protocol Failed - 05/04/2024 12:12 PM      Failed - Medication not assigned to a protocol, review manually.      Passed - Valid encounter within last 12 months    Recent Outpatient Visits           3 days ago Migraine without aura and without status migrainosus, not intractable   [facility] [place], [place] W, NP   3 months ago Neck pain on left side   [place] [facility] [place], Angeline ORN, NP   6 months ago Encounter for general adult medical examination with abnormal findings   [place] [facility] [place], Angeline ORN, NP   9 months ago Paresthesia and pain of both upper extremities    [facility] [place], Angeline ORN, NP       Future Appointments             In 5 months Claudene Lehmann, MD [facility]

## 2024-05-30 ENCOUNTER — Ambulatory Visit (INDEPENDENT_AMBULATORY_CARE_PROVIDER_SITE_OTHER)

## 2024-05-30 DIAGNOSIS — L732 Hidradenitis suppurativa: Secondary | ICD-10-CM

## 2024-05-30 NOTE — Progress Notes (Signed)
 Patient here today for Cosentyx  injection for Hidradenitis Suppurativa.    Cosentyx  300mg /50mL injected into right upper arm. Patient tolerated injection well.    LOT: [id number] EXP: 08/18/2025 NDC: 9921-8929-31   Alan Pizza, RMA

## 2024-06-27 ENCOUNTER — Ambulatory Visit

## 2024-06-27 DIAGNOSIS — L732 Hidradenitis suppurativa: Secondary | ICD-10-CM | POA: Diagnosis not present

## 2024-06-27 NOTE — Progress Notes (Signed)
 Patient here today for Cosentyx  injection for Hidradenitis Suppurativa.    Cosentyx  300mg /26mL injected into left upper arm. Patient tolerated injection well.    LOT: [id number] EXP: 08/18/2025 NDC: 9921-8929-31   Stefano Saba, RMA

## 2024-07-03 ENCOUNTER — Other Ambulatory Visit: Payer: Self-pay | Admitting: Dermatology

## 2024-07-03 ENCOUNTER — Encounter: Payer: Self-pay | Admitting: Internal Medicine

## 2024-07-03 ENCOUNTER — Encounter: Payer: Self-pay | Admitting: Dermatology

## 2024-07-03 ENCOUNTER — Other Ambulatory Visit (HOSPITAL_COMMUNITY)
Admission: RE | Admit: 2024-07-03 | Discharge: 2024-07-03 | Disposition: A | Discharge status: Home / Self Care | Source: Ambulatory Visit | Attending: Internal Medicine | Admitting: Internal Medicine

## 2024-07-03 ENCOUNTER — Ambulatory Visit: Admitting: Internal Medicine

## 2024-07-03 VITALS — BP 118/74 | Ht 60.0 in | Wt 196.6 lb

## 2024-07-03 DIAGNOSIS — L732 Hidradenitis suppurativa: Secondary | ICD-10-CM | POA: Diagnosis not present

## 2024-07-03 DIAGNOSIS — Z5181 Encounter for therapeutic drug level monitoring: Secondary | ICD-10-CM | POA: Diagnosis not present

## 2024-07-03 DIAGNOSIS — Z113 Encounter for screening for infections with a predominantly sexual mode of transmission: Secondary | ICD-10-CM | POA: Insufficient documentation

## 2024-07-03 DIAGNOSIS — Z79899 Other long term (current) drug therapy: Secondary | ICD-10-CM

## 2024-07-03 NOTE — Patient Instructions (Signed)
 How to Have Safe Sex Having safe sex means taking steps before and during sex to reduce your risk of: Getting a sexually transmitted infection (STI). Giving your partner an STI. Unwanted or unplanned pregnancy. How to have safe sex Ways you can have safe sex  Limit your sex partners to only one partner who is only having sex with you. Avoid using alcohol and drugs before having sex. Alcohol and drugs can affect your judgment. Before having sex with a new partner: Talk to your partner about past partners, past STIs, and drug use. Get screened for STIs and discuss the results with your partner. Ask your partner to get screened too. Check your body regularly for sores, blisters, rashes, or unusual discharge. If you notice any of these things, call your health care provider. Avoid sexual contact if you have symptoms of an infection or you're being treated for an STI. While having sex, use a condom. Make sure to: Use a condom every time you have vaginal, oral, or anal sex. Both females and males should wear condoms during oral sex. Do not use a female condom and a female condom at the same time during vaginal sex. Using both types at the same time can cause condoms to break. Keep condoms in place from the beginning to the end of sexual activity. Use a latex condom, if possible. Latex condoms offer the best protection. Use only water-based lubricants with a condom. Using petroleum-based lubricants or oils will weaken the condom and increase the chance that it will break. Ways your health care provider can help you have safe sex  See your provider for regular screenings, exams, and tests for STIs. Talk with your provider about what kind of birth control is best for you. Get vaccinated against hepatitis B and human papillomavirus (HPV). If you're at risk of getting human immunodeficiency virus (HIV), talk with your provider about taking a medicine to prevent HIV. You're at risk for HIV if you: Are  a female who has sex with other males. Are sexually active with more than one partner. Take drugs by injection. Have a sex partner who has HIV. Have unprotected sex. Have sex with someone who has sex with both males and females. Follow these instructions at home: Take your medicines only as told. Call your provider if you think you might be pregnant. Call your provider if have any symptoms of an infection. Where to find more information Centers for Disease Control and Prevention (CDC): [employer].no Office on Women's Health: [employer].ca This information is not intended to replace advice given to you by your health care provider. Make sure you discuss any questions you have with your health care provider. Document Revised: 10/27/2022 Document Reviewed: 10/27/2022 Elsevier Patient Education  2024 [employer].

## 2024-07-03 NOTE — Progress Notes (Signed)
 "  Subjective:    Patient ID: Ellen Kaiser, female    DOB: 06-24-1990, 34 y.o.   MRN: [id number]  HPI  Discussed the use of AI scribe software for clinical note transcription with the patient, who gave verbal consent to proceed.  Ellen Kaiser is a 35 year old female with hidradenitis suppurativa who presents for blood work monitoring while on Cosentyx .  She is currently on Cosentyx  for the treatment of hidradenitis suppurativa and has experienced significant improvement, with no recent outbreaks.  A QuantiFERON TB test was ordered by her dermatologist as part of the monitoring process for Cosentyx .  She would like to have that done here today.  She would also like STD screening.  No current symptoms.       Review of Systems     Past Medical History:  Diagnosis Date   Allergy    Anxiety    Asthma    Bipolar 1 disorder (HCC)    BV (bacterial vaginosis)    Depression    HSV-2 seropositive     Current Outpatient Medications  Medication Sig Dispense Refill   ABILIFY MAINTENA 400 MG PRSY prefilled syringe Inject into the muscle.     AIRSUPRA  90-80 MCG/ACT AERO Inhale 2 Inhalations into the lungs as needed (wheezing, shortness of breath, cough). 32.1 g 1   ARIPiprazole (ABILIFY) 5 MG tablet Take 5 mg by mouth daily as needed.     busPIRone (BUSPAR) 10 MG tablet TAKE 1 TABLET BY MOUTH 2 TO 3 TIMES DIALY AS NEEDED FOR ANXIETY     clonazePAM (KLONOPIN) 1 MG tablet Take 1 tablet by mouth 2 (two) times daily.     hydrOXYzine (ATARAX/VISTARIL) 50 MG tablet Take 100 mg by mouth daily.      Secukinumab  (COSENTYX  UNOREADY) 300 MG/2ML SOAJ Inject 300 mg into the skin every 28 (twenty-eight) days. 2 mL 4   spironolactone  (ALDACTONE ) 50 MG tablet Take 1 tablet (50 mg total) by mouth daily. 7 tablet 0   tiZANidine  (ZANAFLEX ) 4 MG tablet Take 1 tablet (4 mg total) by mouth every 8 (eight) hours as needed for muscle spasms. 30 tablet 0   tretinoin  (RETIN-A ) 0.025 %  cream Apply topically at bedtime. Apply a thin coat to the face every other night as tolerated. 45 g 11   Current Facility-Administered Medications  Medication Dose Route Frequency Provider Last Rate Last Admin   Secukinumab  SOAJ 300 mg  300 mg Subcutaneous Q28 days Claudene Lehmann, MD   300 mg at 06/27/24 1013    Allergies  Allergen Reactions   Fish Allergy Anaphylaxis and Swelling    Family History  Problem Relation Age of Onset   Depression Mother    Alcohol abuse Mother    Bipolar disorder Mother    Healthy Father    Healthy Sister    Osteoarthritis Maternal Grandmother    Dementia Paternal Grandmother     Social History   Socioeconomic History   Marital status: Single    Spouse name: Not on file   Number of children: Not on file   Years of education: college   Highest education level: Not on file  Occupational History   Occupation: disabled    Comment: PTSD  Tobacco Use   Smoking status: Every Day    Current packs/day: 0.50    Average packs/day: 0.5 packs/day for 10.0 years (5.0 ttl pk-yrs)    Types: Cigarettes   Smokeless tobacco: Never  Vaping Use   Vaping status:  Former   Substances: THC  Substance and Sexual Activity   Alcohol use: Yes    Alcohol/week: 3.0 standard drinks of alcohol    Types: 3 Cans of beer per week    Comment: 3 beers weekly   Drug use: Yes    Types: Marijuana   Sexual activity: Yes    Partners: Male    Birth control/protection: None    Comment: Mirena  Other Topics Concern   Not on file  Social History Narrative   Not on file   Social Drivers of Health   Tobacco Use: High Risk (05/01/2024)   Patient History    Smoking Tobacco Use: Every Day    Smokeless Tobacco Use: Never    Passive Exposure: Not on file  Financial Resource Strain: Low Risk (09/16/2023)   Overall Financial Resource Strain (CARDIA)    Difficulty of Paying Living Expenses: Not hard at all  Food Insecurity: No Food Insecurity (09/16/2023)   Hunger Vital  Sign    Worried About Running Out of Food in the Last Year: Never true    Ran Out of Food in the Last Year: Never true  Transportation Needs: No Transportation Needs (09/16/2023)   PRAPARE - Administrator, Civil Service (Medical): No    Lack of Transportation (Non-Medical): No  Physical Activity: Insufficiently Active (09/16/2023)   Exercise Vital Sign    Days of Exercise per Week: 2 days    Minutes of Exercise per Session: 20 min  Stress: No Stress Concern Present (09/16/2023)   [employer] of Occupational Health - Occupational Stress Questionnaire    Feeling of Stress : Only a little  Social Connections: Socially Isolated (09/16/2023)   Social Connection and Isolation Panel    Frequency of Communication with Friends and Family: More than three times a week    Frequency of Social Gatherings with Friends and Family: Never    Attends Religious Services: Never    Database Administrator or Organizations: No    Attends Banker Meetings: Never    Marital Status: Never married  Intimate Partner Violence: Not At Risk (09/16/2023)   Humiliation, Afraid, Rape, and Kick questionnaire    Fear of Current or Ex-Partner: No    Emotionally Abused: No    Physically Abused: No    Sexually Abused: No  Depression (PHQ2-9): Low Risk (05/01/2024)   Depression (PHQ2-9)    PHQ-2 Score: 0  Alcohol Screen: Low Risk (09/16/2023)   Alcohol Screen    Last Alcohol Screening Score (AUDIT): 3  Housing: Unknown (09/16/2023)   Housing Stability Vital Sign    Unable to Pay for Housing in the Last Year: No    Number of Times Moved in the Last Year: Not on file    Homeless in the Last Year: No  Utilities: Not At Risk (09/16/2023)   [facility] Utilities    Threatened with loss of utilities: No  Health Literacy: Adequate Health Literacy (09/16/2023)   B1300 Health Literacy    Frequency of need for help with medical instructions: Never     Constitutional: Patient reports intermittent  headaches.  Denies fever, malaise, fatigue, or abrupt weight changes.  HEENT: Denies eye pain, eye redness, ear pain, ringing in the ears, wax buildup, runny nose, nasal congestion, bloody nose, or sore throat. Respiratory:  Denies difficulty breathing, shortness of breath, cough or sputum production.   Cardiovascular: Denies chest pain, chest tightness, palpitations or swelling in the hands or feet.  Gastrointestinal: Denies abdominal  pain, bloating, constipation, diarrhea or blood in the stool.  GU: Denies urgency, frequency, pain with urination, burning sensation, blood in urine, odor or discharge. Musculoskeletal: Patient reports intermittent neck pain.  Denies decrease in range of motion, difficulty with gait, or joint pain and swelling.  Skin: Denies redness, rashes, lesions or ulcercations.  Neurological: Denies dizziness, difficulty with memory, difficulty with speech or problems with balance and coordination.  Psych: Patient has a history of anxiety and depression.  Denies SI/HI.  No other specific complaints in a complete review of systems (except as listed in HPI above).  Objective:   Physical Exam  BP 118/74 (BP Location: Left Arm, Patient Position: Sitting, Cuff Size: Large)   Ht 5' (1.524 m)   Wt 196 lb 9.6 oz (89.2 kg)   LMP 06/25/2024 (Exact Date)   BMI 38.40 kg/m     Wt Readings from Last 3 Encounters:  05/01/24 195 lb 3.2 oz (88.5 kg)  01/16/24 197 lb (89.4 kg)  10/27/23 191 lb 12.8 oz (87 kg)    General: Appears her stated age, obese, in NAD. Skin: Warm, dry and intact.  Acne noted to face. HEENT: Head: normal shape and size; Eyes: sclera white, no icterus, conjunctiva pink, PERRLA and EOMs intact;  Cardiovascular: Normal rate and rhythm.  Pulmonary/Chest: Normal effort and positive vesicular breath sounds. No respiratory distress. No wheezes, rales or ronchi noted.  Pelvic: Self swab. Musculoskeletal:  No difficulty with gait.  Neurological: Alert and  oriented.  Coordination normal.    BMET    Component Value Date/Time   NA 136 10/27/2023 1019   K 4.2 10/27/2023 1019   CL 108 10/27/2023 1019   CO2 24 10/27/2023 1019   GLUCOSE 84 10/27/2023 1019   BUN 10 10/27/2023 1019   CREATININE 0.92 10/27/2023 1019   CALCIUM 9.8 10/27/2023 1019   GFRNONAA >60 06/04/2023 0321   GFRNONAA 81 05/26/2020 0947   GFRAA 94 05/26/2020 0947    Lipid Panel     Component Value Date/Time   CHOL 176 10/27/2023 1019   TRIG 80 10/27/2023 1019   HDL 76 10/27/2023 1019   CHOLHDL 2.3 10/27/2023 1019   LDLCALC 83 10/27/2023 1019    CBC    Component Value Date/Time   WBC 6.7 10/27/2023 1019   RBC 4.38 10/27/2023 1019   HGB 14.0 10/27/2023 1019   HCT 42.5 10/27/2023 1019   PLT 268 10/27/2023 1019   MCV 97.0 10/27/2023 1019   MCH 32.0 10/27/2023 1019   MCHC 32.9 10/27/2023 1019   RDW 12.5 10/27/2023 1019   LYMPHSABS 2.1 06/04/2023 0321   MONOABS 0.6 06/04/2023 0321   EOSABS 22 07/14/2023 0905   BASOSABS 43 07/14/2023 0905    Hgb A1C Lab Results  Component Value Date   HGBA1C 5.4 10/27/2023           Assessment & Plan:    Assessment and Plan    Hidradenitis suppurativa, medication monitoring Well-controlled with Cosentyx , significant improvement, no recent outbreaks. - Continue Cosentyx  therapy.  Medication monitoring for Cosentyx  therapy Monitoring includes QuantiFERON TB test as ordered by dermatologist. - Ordered QuantiFERON TB test. - Will review results on MyChart.  Screen for STD Asymptomatic - Will check wet prep for gonorrhea, chlamydia and trichomonas - Encouraged safe sexual practices      RTC in 4 months for your annual exam Angeline Laura, NP  "

## 2024-07-05 ENCOUNTER — Ambulatory Visit: Payer: Self-pay | Admitting: Internal Medicine

## 2024-07-05 LAB — QUANTIFERON-TB GOLD PLUS
Mitogen-NIL: 6.22 [IU]/mL
NIL: 0.01 [IU]/mL
QuantiFERON-TB Gold Plus: NEGATIVE
TB1-NIL: 0 [IU]/mL
TB2-NIL: 0 [IU]/mL

## 2024-07-05 LAB — CERVICOVAGINAL ANCILLARY ONLY
Chlamydia: NEGATIVE
Comment: NEGATIVE
Comment: NEGATIVE
Comment: NORMAL
Neisseria Gonorrhea: NEGATIVE
Trichomonas: NEGATIVE

## 2024-07-24 ENCOUNTER — Ambulatory Visit

## 2024-07-24 DIAGNOSIS — L732 Hidradenitis suppurativa: Secondary | ICD-10-CM

## 2024-07-24 NOTE — Progress Notes (Signed)
 Patient here today for Cosentyx  injection for Hidradenitis Suppurativa.    Cosentyx  300mg /49mL injected into right upper arm. Patient tolerated injection well.   Patient recently had insurance changes and unable to afford new copay. She is going to apply for [employer] Patient Assistance and try to get approved for the 2026 year.    LOT: [id number] EXP: 10/18/2025 NDC: 9921-8929-31   Alan Pizza, RMA

## 2024-08-21 ENCOUNTER — Ambulatory Visit

## 2024-09-21 ENCOUNTER — Encounter

## 2024-09-26 ENCOUNTER — Encounter

## 2024-10-02 ENCOUNTER — Ambulatory Visit: Admitting: Dermatology

## 2024-10-29 ENCOUNTER — Encounter: Admitting: Internal Medicine
